# Patient Record
Sex: Female | Born: 1955 | Race: Black or African American | Hispanic: No | Marital: Single | Smoking: Former smoker
Health system: Southern US, Community
[De-identification: ages and names within clinical notes are randomized; demographics above are authoritative.]

## PROBLEM LIST (undated history)

## (undated) DIAGNOSIS — E7800 Pure hypercholesterolemia, unspecified: Secondary | ICD-10-CM

## (undated) DIAGNOSIS — J45909 Unspecified asthma, uncomplicated: Secondary | ICD-10-CM

## (undated) DIAGNOSIS — E559 Vitamin D deficiency, unspecified: Secondary | ICD-10-CM

## (undated) DIAGNOSIS — E0789 Other specified disorders of thyroid: Secondary | ICD-10-CM

## (undated) DIAGNOSIS — E119 Type 2 diabetes mellitus without complications: Secondary | ICD-10-CM

## (undated) DIAGNOSIS — M549 Dorsalgia, unspecified: Secondary | ICD-10-CM

## (undated) DIAGNOSIS — N189 Chronic kidney disease, unspecified: Secondary | ICD-10-CM

## (undated) DIAGNOSIS — J439 Emphysema, unspecified: Secondary | ICD-10-CM

## (undated) DIAGNOSIS — I10 Essential (primary) hypertension: Secondary | ICD-10-CM

## (undated) DIAGNOSIS — J449 Chronic obstructive pulmonary disease, unspecified: Secondary | ICD-10-CM

## (undated) DIAGNOSIS — E739 Lactose intolerance, unspecified: Secondary | ICD-10-CM

## (undated) DIAGNOSIS — R12 Heartburn: Secondary | ICD-10-CM

## (undated) DIAGNOSIS — T7840XA Allergy, unspecified, initial encounter: Secondary | ICD-10-CM

## (undated) DIAGNOSIS — K5900 Constipation, unspecified: Secondary | ICD-10-CM

## (undated) DIAGNOSIS — H409 Unspecified glaucoma: Secondary | ICD-10-CM

## (undated) DIAGNOSIS — G4730 Sleep apnea, unspecified: Secondary | ICD-10-CM

## (undated) DIAGNOSIS — M1990 Unspecified osteoarthritis, unspecified site: Secondary | ICD-10-CM

## (undated) DIAGNOSIS — H269 Unspecified cataract: Secondary | ICD-10-CM

## (undated) DIAGNOSIS — R0602 Shortness of breath: Secondary | ICD-10-CM

## (undated) HISTORY — DX: Chronic kidney disease, unspecified: N18.9

## (undated) HISTORY — DX: Lactose intolerance, unspecified: E73.9

## (undated) HISTORY — DX: Emphysema, unspecified: J43.9

## (undated) HISTORY — DX: Shortness of breath: R06.02

## (undated) HISTORY — DX: Vitamin D deficiency, unspecified: E55.9

## (undated) HISTORY — DX: Allergy, unspecified, initial encounter: T78.40XA

## (undated) HISTORY — DX: Other specified disorders of thyroid: E07.89

## (undated) HISTORY — PX: BREAST SURGERY: SHX581

## (undated) HISTORY — DX: Dorsalgia, unspecified: M54.9

## (undated) HISTORY — DX: Type 2 diabetes mellitus without complications: E11.9

## (undated) HISTORY — DX: Unspecified cataract: H26.9

## (undated) HISTORY — DX: Sleep apnea, unspecified: G47.30

## (undated) HISTORY — DX: Unspecified glaucoma: H40.9

## (undated) HISTORY — PX: COLONOSCOPY: SHX174

## (undated) HISTORY — DX: Constipation, unspecified: K59.00

## (undated) HISTORY — DX: Pure hypercholesterolemia, unspecified: E78.00

## (undated) HISTORY — DX: Unspecified osteoarthritis, unspecified site: M19.90

## (undated) HISTORY — DX: Heartburn: R12

---

## 1998-04-06 ENCOUNTER — Ambulatory Visit (HOSPITAL_COMMUNITY): Admission: RE | Admit: 1998-04-06 | Discharge: 1998-04-06 | Payer: Self-pay | Admitting: Family Medicine

## 1998-04-06 ENCOUNTER — Encounter: Payer: Self-pay | Admitting: Family Medicine

## 1998-06-14 ENCOUNTER — Emergency Department (HOSPITAL_COMMUNITY): Admission: EM | Admit: 1998-06-14 | Discharge: 1998-06-14 | Payer: Self-pay | Admitting: Emergency Medicine

## 1998-10-02 ENCOUNTER — Ambulatory Visit (HOSPITAL_COMMUNITY): Admission: RE | Admit: 1998-10-02 | Discharge: 1998-10-02 | Payer: Self-pay | Admitting: *Deleted

## 1998-10-02 ENCOUNTER — Encounter: Payer: Self-pay | Admitting: Family Medicine

## 1998-11-03 ENCOUNTER — Ambulatory Visit (HOSPITAL_COMMUNITY): Admission: RE | Admit: 1998-11-03 | Discharge: 1998-11-03 | Payer: Self-pay | Admitting: General Surgery

## 1999-05-12 ENCOUNTER — Ambulatory Visit (HOSPITAL_COMMUNITY): Admission: RE | Admit: 1999-05-12 | Discharge: 1999-05-12 | Payer: Self-pay | Admitting: *Deleted

## 1999-05-12 ENCOUNTER — Encounter: Payer: Self-pay | Admitting: Chiropractic Medicine

## 2000-03-07 ENCOUNTER — Ambulatory Visit (HOSPITAL_COMMUNITY): Admission: RE | Admit: 2000-03-07 | Discharge: 2000-03-07 | Payer: Self-pay | Admitting: Family Medicine

## 2000-03-07 ENCOUNTER — Encounter: Payer: Self-pay | Admitting: Family Medicine

## 2000-04-22 ENCOUNTER — Encounter: Admission: RE | Admit: 2000-04-22 | Discharge: 2000-05-20 | Payer: Self-pay | Admitting: Family Medicine

## 2000-06-30 ENCOUNTER — Encounter: Payer: Self-pay | Admitting: Family Medicine

## 2000-06-30 ENCOUNTER — Ambulatory Visit (HOSPITAL_COMMUNITY): Admission: RE | Admit: 2000-06-30 | Discharge: 2000-06-30 | Payer: Self-pay | Admitting: Family Medicine

## 2001-07-23 ENCOUNTER — Encounter: Admission: RE | Admit: 2001-07-23 | Discharge: 2001-07-23 | Payer: Self-pay | Admitting: General Practice

## 2001-07-23 ENCOUNTER — Encounter: Payer: Self-pay | Admitting: General Practice

## 2003-07-11 ENCOUNTER — Ambulatory Visit (HOSPITAL_COMMUNITY): Admission: RE | Admit: 2003-07-11 | Discharge: 2003-07-11 | Payer: Self-pay | Admitting: Family Medicine

## 2003-09-14 ENCOUNTER — Ambulatory Visit (HOSPITAL_COMMUNITY): Admission: RE | Admit: 2003-09-14 | Discharge: 2003-09-14 | Payer: Self-pay | Admitting: Orthopedic Surgery

## 2003-11-21 ENCOUNTER — Other Ambulatory Visit: Admission: RE | Admit: 2003-11-21 | Discharge: 2003-11-21 | Payer: Self-pay | Admitting: Family Medicine

## 2003-11-25 ENCOUNTER — Ambulatory Visit (HOSPITAL_COMMUNITY): Admission: RE | Admit: 2003-11-25 | Discharge: 2003-11-25 | Payer: Self-pay | Admitting: Family Medicine

## 2004-05-12 ENCOUNTER — Emergency Department (HOSPITAL_COMMUNITY): Admission: EM | Admit: 2004-05-12 | Discharge: 2004-05-12 | Payer: Self-pay | Admitting: Emergency Medicine

## 2004-08-17 ENCOUNTER — Ambulatory Visit: Payer: Self-pay | Admitting: Family Medicine

## 2004-09-13 ENCOUNTER — Encounter: Admission: RE | Admit: 2004-09-13 | Discharge: 2004-12-12 | Payer: Self-pay | Admitting: Orthopedic Surgery

## 2005-04-12 ENCOUNTER — Ambulatory Visit: Payer: Self-pay | Admitting: Family Medicine

## 2005-07-12 ENCOUNTER — Encounter (INDEPENDENT_AMBULATORY_CARE_PROVIDER_SITE_OTHER): Payer: Self-pay | Admitting: Family Medicine

## 2005-07-12 ENCOUNTER — Ambulatory Visit: Payer: Self-pay | Admitting: Family Medicine

## 2005-08-09 ENCOUNTER — Ambulatory Visit (HOSPITAL_COMMUNITY): Admission: RE | Admit: 2005-08-09 | Discharge: 2005-08-09 | Payer: Self-pay | Admitting: Family Medicine

## 2005-09-20 ENCOUNTER — Ambulatory Visit: Payer: Self-pay | Admitting: Nurse Practitioner

## 2005-10-09 ENCOUNTER — Ambulatory Visit: Payer: Self-pay | Admitting: Family Medicine

## 2005-11-27 ENCOUNTER — Ambulatory Visit: Payer: Self-pay | Admitting: Family Medicine

## 2005-12-24 ENCOUNTER — Ambulatory Visit: Payer: Self-pay | Admitting: Family Medicine

## 2006-04-22 ENCOUNTER — Ambulatory Visit: Payer: Self-pay | Admitting: Family Medicine

## 2006-04-23 ENCOUNTER — Ambulatory Visit: Payer: Self-pay | Admitting: *Deleted

## 2006-07-24 ENCOUNTER — Emergency Department (HOSPITAL_COMMUNITY): Admission: EM | Admit: 2006-07-24 | Discharge: 2006-07-24 | Payer: Self-pay | Admitting: Emergency Medicine

## 2006-08-01 ENCOUNTER — Emergency Department (HOSPITAL_COMMUNITY): Admission: EM | Admit: 2006-08-01 | Discharge: 2006-08-01 | Payer: Self-pay | Admitting: Family Medicine

## 2006-08-14 ENCOUNTER — Ambulatory Visit (HOSPITAL_COMMUNITY): Admission: RE | Admit: 2006-08-14 | Discharge: 2006-08-14 | Payer: Self-pay | Admitting: Family Medicine

## 2006-08-18 ENCOUNTER — Ambulatory Visit: Payer: Self-pay | Admitting: Family Medicine

## 2006-11-28 ENCOUNTER — Ambulatory Visit: Payer: Self-pay | Admitting: Family Medicine

## 2007-04-22 ENCOUNTER — Encounter (INDEPENDENT_AMBULATORY_CARE_PROVIDER_SITE_OTHER): Payer: Self-pay | Admitting: *Deleted

## 2007-06-11 ENCOUNTER — Ambulatory Visit: Payer: Self-pay | Admitting: Nurse Practitioner

## 2007-06-26 ENCOUNTER — Ambulatory Visit: Payer: Self-pay | Admitting: Family Medicine

## 2007-06-30 ENCOUNTER — Ambulatory Visit (HOSPITAL_COMMUNITY): Admission: RE | Admit: 2007-06-30 | Discharge: 2007-06-30 | Payer: Self-pay | Admitting: Family Medicine

## 2007-07-01 ENCOUNTER — Encounter (INDEPENDENT_AMBULATORY_CARE_PROVIDER_SITE_OTHER): Payer: Self-pay | Admitting: Family Medicine

## 2007-07-01 ENCOUNTER — Ambulatory Visit: Payer: Self-pay | Admitting: Internal Medicine

## 2007-07-01 LAB — CONVERTED CEMR LAB
ALT: 9 units/L (ref 0–35)
AST: 11 units/L (ref 0–37)
Alkaline Phosphatase: 85 units/L (ref 39–117)
Basophils Absolute: 0 10*3/uL (ref 0.0–0.1)
Basophils Relative: 1 % (ref 0–1)
Chloride: 107 meq/L (ref 96–112)
Hemoglobin: 16 g/dL — ABNORMAL HIGH (ref 12.0–15.0)
MCHC: 34.5 g/dL (ref 30.0–36.0)
Neutro Abs: 2.3 10*3/uL (ref 1.7–7.7)
Neutrophils Relative %: 46 % (ref 43–77)
Platelets: 291 10*3/uL (ref 150–400)
Potassium: 4.1 meq/L (ref 3.5–5.3)
RDW: 13.6 % (ref 11.5–15.5)

## 2007-07-06 ENCOUNTER — Ambulatory Visit (HOSPITAL_COMMUNITY): Admission: RE | Admit: 2007-07-06 | Discharge: 2007-07-06 | Payer: Self-pay | Admitting: Family Medicine

## 2007-08-21 ENCOUNTER — Ambulatory Visit (HOSPITAL_COMMUNITY): Admission: RE | Admit: 2007-08-21 | Discharge: 2007-08-21 | Payer: Self-pay | Admitting: Family Medicine

## 2008-04-20 ENCOUNTER — Encounter (INDEPENDENT_AMBULATORY_CARE_PROVIDER_SITE_OTHER): Payer: Self-pay | Admitting: Family Medicine

## 2008-04-20 ENCOUNTER — Ambulatory Visit: Payer: Self-pay | Admitting: Internal Medicine

## 2008-04-20 LAB — CONVERTED CEMR LAB
AST: 11 units/L (ref 0–37)
Albumin: 4.3 g/dL (ref 3.5–5.2)
Alkaline Phosphatase: 88 units/L (ref 39–117)
BUN: 18 mg/dL (ref 6–23)
Basophils Relative: 0 % (ref 0–1)
Eosinophils Absolute: 0.3 10*3/uL (ref 0.0–0.7)
MCHC: 32.6 g/dL (ref 30.0–36.0)
MCV: 92 fL (ref 78.0–100.0)
Monocytes Relative: 7 % (ref 3–12)
Neutrophils Relative %: 49 % (ref 43–77)
Platelets: 300 10*3/uL (ref 150–400)
Potassium: 4.7 meq/L (ref 3.5–5.3)
RBC: 5.24 M/uL — ABNORMAL HIGH (ref 3.87–5.11)

## 2008-06-07 ENCOUNTER — Ambulatory Visit (HOSPITAL_COMMUNITY): Admission: RE | Admit: 2008-06-07 | Discharge: 2008-06-07 | Payer: Self-pay | Admitting: Internal Medicine

## 2008-08-12 ENCOUNTER — Ambulatory Visit: Payer: Self-pay | Admitting: Family Medicine

## 2008-08-12 ENCOUNTER — Encounter (INDEPENDENT_AMBULATORY_CARE_PROVIDER_SITE_OTHER): Payer: Self-pay | Admitting: Family Medicine

## 2008-08-23 ENCOUNTER — Ambulatory Visit (HOSPITAL_COMMUNITY): Admission: RE | Admit: 2008-08-23 | Discharge: 2008-08-23 | Payer: Self-pay | Admitting: Internal Medicine

## 2008-09-27 ENCOUNTER — Ambulatory Visit: Payer: Self-pay | Admitting: Family Medicine

## 2008-10-28 ENCOUNTER — Ambulatory Visit: Payer: Self-pay | Admitting: Family Medicine

## 2008-12-21 ENCOUNTER — Ambulatory Visit: Payer: Self-pay | Admitting: Family Medicine

## 2008-12-21 ENCOUNTER — Ambulatory Visit: Payer: Self-pay | Admitting: *Deleted

## 2009-08-02 ENCOUNTER — Telehealth (INDEPENDENT_AMBULATORY_CARE_PROVIDER_SITE_OTHER): Payer: Self-pay | Admitting: *Deleted

## 2009-08-02 ENCOUNTER — Ambulatory Visit: Payer: Self-pay | Admitting: Internal Medicine

## 2009-08-24 ENCOUNTER — Ambulatory Visit (HOSPITAL_COMMUNITY): Admission: RE | Admit: 2009-08-24 | Discharge: 2009-08-24 | Payer: Self-pay | Admitting: Family Medicine

## 2009-08-29 ENCOUNTER — Ambulatory Visit: Payer: Self-pay | Admitting: Internal Medicine

## 2009-09-21 ENCOUNTER — Ambulatory Visit: Payer: Self-pay | Admitting: Family Medicine

## 2009-09-22 ENCOUNTER — Ambulatory Visit (HOSPITAL_COMMUNITY): Admission: RE | Admit: 2009-09-22 | Discharge: 2009-09-22 | Payer: Self-pay | Admitting: Family Medicine

## 2009-10-10 ENCOUNTER — Ambulatory Visit: Payer: Self-pay | Admitting: Internal Medicine

## 2010-01-24 ENCOUNTER — Ambulatory Visit: Payer: Self-pay | Admitting: Internal Medicine

## 2010-02-22 ENCOUNTER — Ambulatory Visit: Payer: Self-pay | Admitting: Family Medicine

## 2010-06-15 ENCOUNTER — Emergency Department (HOSPITAL_COMMUNITY)
Admission: EM | Admit: 2010-06-15 | Discharge: 2010-06-15 | Payer: Self-pay | Source: Home / Self Care | Admitting: Emergency Medicine

## 2010-08-26 ENCOUNTER — Encounter: Payer: Self-pay | Admitting: Family Medicine

## 2010-08-28 ENCOUNTER — Encounter (INDEPENDENT_AMBULATORY_CARE_PROVIDER_SITE_OTHER): Payer: Self-pay | Admitting: Family Medicine

## 2010-08-28 LAB — CONVERTED CEMR LAB
CO2: 24 meq/L (ref 19–32)
Calcium: 9.5 mg/dL (ref 8.4–10.5)
Chloride: 106 meq/L (ref 96–112)
Creatinine, Ser: 0.77 mg/dL (ref 0.40–1.20)
Eosinophils Relative: 4 % (ref 0–5)
Glucose, Bld: 96 mg/dL (ref 70–99)
HCT: 44.6 % (ref 36.0–46.0)
HCV Ab: NEGATIVE
Hemoglobin: 15.4 g/dL — ABNORMAL HIGH (ref 12.0–15.0)
Lymphocytes Relative: 35 % (ref 12–46)
Lymphs Abs: 2 10*3/uL (ref 0.7–4.0)
Monocytes Absolute: 0.4 10*3/uL (ref 0.1–1.0)
Monocytes Relative: 6 % (ref 3–12)
Neutro Abs: 3.2 10*3/uL (ref 1.7–7.7)
RBC: 4.98 M/uL (ref 3.87–5.11)
RDW: 13.4 % (ref 11.5–15.5)
TSH: 1.144 microintl units/mL (ref 0.350–4.500)
Total Bilirubin: 0.4 mg/dL (ref 0.3–1.2)
WBC: 5.7 10*3/uL (ref 4.0–10.5)

## 2010-10-10 ENCOUNTER — Other Ambulatory Visit (HOSPITAL_COMMUNITY): Payer: Self-pay | Admitting: Family Medicine

## 2010-10-10 DIAGNOSIS — Z1231 Encounter for screening mammogram for malignant neoplasm of breast: Secondary | ICD-10-CM

## 2010-10-12 ENCOUNTER — Ambulatory Visit (HOSPITAL_COMMUNITY)
Admission: RE | Admit: 2010-10-12 | Discharge: 2010-10-12 | Disposition: A | Discharge status: Home / Self Care | Payer: Self-pay | Source: Ambulatory Visit | Attending: Family Medicine | Admitting: Family Medicine

## 2010-10-12 DIAGNOSIS — Z1231 Encounter for screening mammogram for malignant neoplasm of breast: Secondary | ICD-10-CM | POA: Insufficient documentation

## 2011-03-26 ENCOUNTER — Other Ambulatory Visit: Payer: Self-pay | Admitting: Family Medicine

## 2011-10-23 ENCOUNTER — Other Ambulatory Visit (HOSPITAL_COMMUNITY): Payer: Self-pay | Admitting: Family Medicine

## 2011-10-23 DIAGNOSIS — Z1231 Encounter for screening mammogram for malignant neoplasm of breast: Secondary | ICD-10-CM

## 2014-03-09 ENCOUNTER — Other Ambulatory Visit: Payer: Self-pay | Admitting: Nurse Practitioner

## 2014-03-09 ENCOUNTER — Other Ambulatory Visit (HOSPITAL_COMMUNITY): Payer: Self-pay | Admitting: Nurse Practitioner

## 2014-03-09 DIAGNOSIS — Z1231 Encounter for screening mammogram for malignant neoplasm of breast: Secondary | ICD-10-CM

## 2014-03-18 ENCOUNTER — Ambulatory Visit (HOSPITAL_COMMUNITY)
Admission: RE | Admit: 2014-03-18 | Discharge: 2014-03-18 | Disposition: A | Discharge status: Home / Self Care | Payer: No Typology Code available for payment source | Source: Ambulatory Visit | Attending: Nurse Practitioner | Admitting: Nurse Practitioner

## 2014-03-18 DIAGNOSIS — Z1231 Encounter for screening mammogram for malignant neoplasm of breast: Secondary | ICD-10-CM | POA: Insufficient documentation

## 2014-04-19 ENCOUNTER — Other Ambulatory Visit: Payer: Self-pay | Admitting: Nurse Practitioner

## 2014-04-19 DIAGNOSIS — Z1231 Encounter for screening mammogram for malignant neoplasm of breast: Secondary | ICD-10-CM

## 2014-05-05 ENCOUNTER — Other Ambulatory Visit: Payer: Self-pay | Admitting: Primary Care

## 2014-05-05 ENCOUNTER — Ambulatory Visit
Admission: RE | Admit: 2014-05-05 | Discharge: 2014-05-05 | Disposition: A | Discharge status: Home / Self Care | Payer: No Typology Code available for payment source | Source: Ambulatory Visit | Attending: Primary Care | Admitting: Primary Care

## 2014-05-05 DIAGNOSIS — R062 Wheezing: Secondary | ICD-10-CM

## 2014-05-05 DIAGNOSIS — R05 Cough: Secondary | ICD-10-CM

## 2014-05-05 DIAGNOSIS — R059 Cough, unspecified: Secondary | ICD-10-CM

## 2015-08-16 ENCOUNTER — Other Ambulatory Visit: Payer: Self-pay | Admitting: Primary Care

## 2015-08-16 DIAGNOSIS — Z1231 Encounter for screening mammogram for malignant neoplasm of breast: Secondary | ICD-10-CM

## 2015-09-05 ENCOUNTER — Ambulatory Visit
Admission: RE | Admit: 2015-09-05 | Discharge: 2015-09-05 | Disposition: A | Discharge status: Home / Self Care | Payer: No Typology Code available for payment source | Source: Ambulatory Visit | Attending: Primary Care | Admitting: Primary Care

## 2015-09-05 DIAGNOSIS — Z1231 Encounter for screening mammogram for malignant neoplasm of breast: Secondary | ICD-10-CM

## 2018-05-08 ENCOUNTER — Observation Stay (HOSPITAL_COMMUNITY)
Admission: EM | Admit: 2018-05-08 | Discharge: 2018-05-10 | Disposition: A | Discharge status: Home / Self Care | Payer: Self-pay | Attending: Internal Medicine | Admitting: Internal Medicine

## 2018-05-08 ENCOUNTER — Emergency Department (HOSPITAL_COMMUNITY): Payer: Self-pay

## 2018-05-08 ENCOUNTER — Encounter (HOSPITAL_COMMUNITY): Payer: Self-pay | Admitting: Emergency Medicine

## 2018-05-08 DIAGNOSIS — J441 Chronic obstructive pulmonary disease with (acute) exacerbation: Secondary | ICD-10-CM

## 2018-05-08 DIAGNOSIS — E119 Type 2 diabetes mellitus without complications: Secondary | ICD-10-CM

## 2018-05-08 DIAGNOSIS — J069 Acute upper respiratory infection, unspecified: Principal | ICD-10-CM | POA: Insufficient documentation

## 2018-05-08 DIAGNOSIS — Z888 Allergy status to other drugs, medicaments and biological substances status: Secondary | ICD-10-CM | POA: Insufficient documentation

## 2018-05-08 DIAGNOSIS — Z6841 Body Mass Index (BMI) 40.0 and over, adult: Secondary | ICD-10-CM | POA: Insufficient documentation

## 2018-05-08 DIAGNOSIS — I10 Essential (primary) hypertension: Secondary | ICD-10-CM

## 2018-05-08 DIAGNOSIS — Z720 Tobacco use: Secondary | ICD-10-CM

## 2018-05-08 DIAGNOSIS — Z79899 Other long term (current) drug therapy: Secondary | ICD-10-CM | POA: Insufficient documentation

## 2018-05-08 DIAGNOSIS — R0602 Shortness of breath: Secondary | ICD-10-CM

## 2018-05-08 DIAGNOSIS — J9601 Acute respiratory failure with hypoxia: Secondary | ICD-10-CM

## 2018-05-08 DIAGNOSIS — J9621 Acute and chronic respiratory failure with hypoxia: Secondary | ICD-10-CM

## 2018-05-08 DIAGNOSIS — F17210 Nicotine dependence, cigarettes, uncomplicated: Secondary | ICD-10-CM | POA: Insufficient documentation

## 2018-05-08 DIAGNOSIS — E669 Obesity, unspecified: Secondary | ICD-10-CM | POA: Insufficient documentation

## 2018-05-08 DIAGNOSIS — Z23 Encounter for immunization: Secondary | ICD-10-CM | POA: Insufficient documentation

## 2018-05-08 HISTORY — DX: Chronic obstructive pulmonary disease, unspecified: J44.9

## 2018-05-08 HISTORY — DX: Type 2 diabetes mellitus without complications: E11.9

## 2018-05-08 HISTORY — DX: Unspecified asthma, uncomplicated: J45.909

## 2018-05-08 HISTORY — DX: Essential (primary) hypertension: I10

## 2018-05-08 LAB — CBC
HCT: 50.2 % — ABNORMAL HIGH (ref 36.0–46.0)
Hemoglobin: 16.5 g/dL — ABNORMAL HIGH (ref 12.0–15.0)
MCH: 31.7 pg (ref 26.0–34.0)
MCHC: 32.9 g/dL (ref 30.0–36.0)
MCV: 96.4 fL (ref 78.0–100.0)
Platelets: 214 10*3/uL (ref 150–400)
RBC: 5.21 MIL/uL — ABNORMAL HIGH (ref 3.87–5.11)
RDW: 13.1 % (ref 11.5–15.5)
WBC: 5.9 10*3/uL (ref 4.0–10.5)

## 2018-05-08 LAB — BASIC METABOLIC PANEL
Anion gap: 7 (ref 5–15)
BUN: 13 mg/dL (ref 8–23)
CO2: 31 mmol/L (ref 22–32)
Calcium: 8.9 mg/dL (ref 8.9–10.3)
Chloride: 103 mmol/L (ref 98–111)
Creatinine, Ser: 0.71 mg/dL (ref 0.44–1.00)
GFR calc Af Amer: >60 mL/min (ref >60)
GFR calc non Af Amer: >60 mL/min (ref >60)
Glucose, Bld: 97 mg/dL (ref 70–99)
Potassium: 3.7 mmol/L (ref 3.5–5.1)
Sodium: 141 mmol/L (ref 135–145)

## 2018-05-08 LAB — I-STAT TROPONIN, ED: Troponin i, poc: 0 ng/mL (ref 0.00–0.08)

## 2018-05-08 LAB — BRAIN NATRIURETIC PEPTIDE: B Natriuretic Peptide: 55.5 pg/mL (ref 0.0–100.0)

## 2018-05-08 MED ORDER — BENZONATATE 100 MG PO CAPS
100.0000 mg | ORAL_CAPSULE | Freq: Once | ORAL | Status: AC
  Administered 2018-05-08: 100 mg via ORAL
  Filled 2018-05-08: qty 1

## 2018-05-08 MED ORDER — NICOTINE 21 MG/24HR TD PT24
21.0000 mg | MEDICATED_PATCH | Freq: Every day | TRANSDERMAL | Status: DC
  Administered 2018-05-09 – 2018-05-10 (×2): 21 mg via TRANSDERMAL
  Filled 2018-05-08 (×2): qty 1

## 2018-05-08 MED ORDER — HYDROCHLOROTHIAZIDE 25 MG PO TABS
25.0000 mg | ORAL_TABLET | Freq: Every day | ORAL | Status: DC
  Administered 2018-05-09 – 2018-05-10 (×2): 25 mg via ORAL
  Filled 2018-05-08 (×2): qty 1

## 2018-05-08 MED ORDER — SODIUM CHLORIDE 0.9 % IV SOLN
1.0000 g | Freq: Once | INTRAVENOUS | Status: AC
  Administered 2018-05-08: 1 g via INTRAVENOUS
  Filled 2018-05-08: qty 10

## 2018-05-08 MED ORDER — IPRATROPIUM-ALBUTEROL 0.5-2.5 (3) MG/3ML IN SOLN
3.0000 mL | RESPIRATORY_TRACT | Status: AC
  Administered 2018-05-08 (×3): 3 mL via RESPIRATORY_TRACT
  Filled 2018-05-08: qty 6
  Filled 2018-05-08: qty 3

## 2018-05-08 MED ORDER — METHYLPREDNISOLONE SODIUM SUCC 125 MG IJ SOLR
60.0000 mg | Freq: Two times a day (BID) | INTRAMUSCULAR | Status: DC
  Administered 2018-05-09: 60 mg via INTRAVENOUS
  Filled 2018-05-08: qty 2

## 2018-05-08 MED ORDER — ALBUTEROL SULFATE (2.5 MG/3ML) 0.083% IN NEBU: 2.5000 mg | INHALATION_SOLUTION | RESPIRATORY_TRACT | Status: DC | PRN

## 2018-05-08 MED ORDER — SODIUM CHLORIDE 0.9 % IV SOLN
1.0000 g | INTRAVENOUS | Status: DC
  Administered 2018-05-09: 1 g via INTRAVENOUS
  Filled 2018-05-08 (×2): qty 10

## 2018-05-08 MED ORDER — HYDROCOD POLST-CPM POLST ER 10-8 MG/5ML PO SUER: 5.0000 mL | Freq: Two times a day (BID) | ORAL | Status: DC | PRN

## 2018-05-08 MED ORDER — PREDNISONE 20 MG PO TABS
60.0000 mg | ORAL_TABLET | Freq: Once | ORAL | Status: AC
  Administered 2018-05-08: 60 mg via ORAL
  Filled 2018-05-08: qty 3

## 2018-05-08 MED ORDER — ENOXAPARIN SODIUM 40 MG/0.4ML ~~LOC~~ SOLN
40.0000 mg | Freq: Every day | SUBCUTANEOUS | Status: DC
  Administered 2018-05-09 (×2): 40 mg via SUBCUTANEOUS
  Filled 2018-05-08 (×2): qty 0.4

## 2018-05-08 MED ORDER — IPRATROPIUM-ALBUTEROL 0.5-2.5 (3) MG/3ML IN SOLN
3.0000 mL | RESPIRATORY_TRACT | Status: DC
  Administered 2018-05-09 – 2018-05-10 (×9): 3 mL via RESPIRATORY_TRACT
  Filled 2018-05-08 (×10): qty 3

## 2018-05-08 MED ORDER — SODIUM CHLORIDE 0.9 % IV SOLN
500.0000 mg | INTRAVENOUS | Status: DC
  Administered 2018-05-09: 500 mg via INTRAVENOUS
  Filled 2018-05-08: qty 500

## 2018-05-08 MED ORDER — SODIUM CHLORIDE 0.9 % IV SOLN
500.0000 mg | Freq: Once | INTRAVENOUS | Status: AC
  Administered 2018-05-08: 500 mg via INTRAVENOUS
  Filled 2018-05-08: qty 500

## 2018-05-08 NOTE — ED Notes (Signed)
Pt given turkey sandwich and water

## 2018-05-08 NOTE — ED Provider Notes (Signed)
[place] EMERGENCY DEPARTMENT Provider Note   CSN: [id number] Arrival date & time: 05/08/18  1652     History   Chief Complaint Chief Complaint  Patient presents with  . Shortness of Breath    HPI Leah Lawson is a 62 y.o. female.  62 yo F with a chief complaint of cough and congestion.  This been going on for the past couple weeks.  She felt that her shortness of breath is gotten worse today and so came to the emergency department.  She was actually seen at urgent care and was noted to be hypoxic and then was sent over here.  She currently feels much better, she had coughed up a large amount of sputum.  She is a current smoker but is planning to quit.  She denies fevers or chills.  Denies ear or facial pain.  Has a mild sore throat.  She is been trying over-the-counter medicines without much relief.  She was unsure of sick contacts but mentions there were a few people at work that had been sick.  The history is provided by the patient and a relative.  Shortness of Breath  This is a new problem. The average episode lasts 2 weeks. The problem occurs continuously.The current episode started more than 1 week ago. The problem has been gradually worsening. Associated symptoms include cough. Pertinent negatives include no fever, no headaches, no rhinorrhea, no wheezing, no chest pain and no vomiting. She has tried nothing for the symptoms. The treatment provided no relief. She has had no prior hospitalizations. She has had no prior ED visits. She has had no prior ICU admissions.    Past Medical History:  Diagnosis Date  . Asthma   . COPD (chronic obstructive pulmonary disease) ([place])   . Diabetes mellitus without complication ([place])    "pre-diabetic"  . Hypertension     There are no active problems to display for this patient.   Past Surgical History:  Procedure Laterality Date  . BREAST SURGERY       OB History   None      Home Medications    Prior  to Admission medications   Medication Sig Start Date End Date Taking? Authorizing Provider  albuterol (PROVENTIL HFA;VENTOLIN HFA) 108 (90 Base) MCG/ACT inhaler Inhale 1 puff into the lungs every 6 (six) hours as needed for wheezing or shortness of breath.   Yes [provider]  hydrochlorothiazide (HYDRODIURIL) 25 MG tablet Take 25 mg by mouth daily. 04/08/18  Yes [provider]  Phenyleph-Doxylamine-DM-APAP (ALKA-SELTZER PLS NIGHT CLD/FLU PO) Take 2 tablets by mouth as needed (cold syptoms).   Yes [provider]    Family History History reviewed. No pertinent family history.  Social History Social History   Tobacco Use  . Smoking status: Current Every Day Smoker    Packs/day: 0.50  . Smokeless tobacco: Never Used  Substance Use Topics  . Alcohol use: Not Currently  . Drug use: Never     Allergies   Mucinex [guaifenesin er] and Singulair [montelukast sodium]   Review of Systems Review of Systems  Constitutional: Negative for chills and fever.  HENT: Positive for congestion. Negative for rhinorrhea.   Eyes: Negative for redness and visual disturbance.  Respiratory: Positive for cough and shortness of breath. Negative for wheezing.   Cardiovascular: Negative for chest pain and palpitations.  Gastrointestinal: Negative for nausea and vomiting.  Genitourinary: Negative for dysuria and urgency.  Musculoskeletal: Negative for arthralgias and myalgias.  Skin: Negative for pallor and wound.  Neurological: Negative for dizziness and headaches.     Physical Exam Updated Vital Signs BP (!) 154/80   Pulse 63   Temp 98.8 F (37.1 C) (Oral)   Resp 17   SpO2 97%   Physical Exam  Constitutional: She is oriented to person, place, and time. She appears well-developed and well-nourished. No distress.  HENT:  Head: Normocephalic and atraumatic.  Swollen turbinates, posterior nasal drip, no noted sinus ttp, tm normal bilaterally.    Eyes: Pupils are  equal, round, and reactive to light. EOM are normal.  Neck: Normal range of motion. Neck supple.  Cardiovascular: Normal rate and regular rhythm. Exam reveals no gallop and no friction rub.  No murmur heard. Pulmonary/Chest: Effort normal. She has wheezes ( Diffuse with prolonged expiratory effort). She has no rales.  Abdominal: Soft. She exhibits no distension. There is no tenderness.  Musculoskeletal: She exhibits no edema or tenderness.  Neurological: She is alert and oriented to person, place, and time.  Skin: Skin is warm and dry. She is not diaphoretic.  Psychiatric: She has a normal mood and affect. Her behavior is normal.  Nursing note and vitals reviewed.    ED Treatments / Results  Labs (all labs ordered are listed, but only abnormal results are displayed) Labs Reviewed  CBC - Abnormal; Notable for the following components:      Result Value   RBC 5.21 (*)    Hemoglobin 16.5 (*)    HCT 50.2 (*)    All other components within normal limits  CULTURE, BLOOD (ROUTINE X 2)  CULTURE, BLOOD (ROUTINE X 2)  BASIC METABOLIC PANEL  BRAIN NATRIURETIC PEPTIDE  I-STAT TROPONIN, ED    EKG EKG Interpretation  Date/Time:  Friday May 08 2018 17:04:47 EDT Ventricular Rate:  72 PR Interval:  132 QRS Duration: 76 QT Interval:  388 QTC Calculation: 424 R Axis:   50 Text Interpretation:  Sinus rhythm with marked sinus arrhythmia Low voltage QRS Cannot rule out Anterior infarct , age undetermined Abnormal ECG No significant change since last tracing Confirmed by Deno Etienne [phone]) on 05/08/2018 6:57:30 PM   Radiology Dg Chest 2 View  Result Date: 05/08/2018 CLINICAL DATA:  Cough and shortness of breath EXAM: CHEST - 2 VIEW COMPARISON:  05/05/2014 FINDINGS: Hyperinflation. Diffuse increased interstitial opacity. No pleural effusion. No focal infiltrate. Normal heart size. No pneumothorax. IMPRESSION: Diffusely increased interstitial opacity, suspect for diffuse interstitial  inflammatory process. There is no focal airspace disease. Electronically Signed   By: Donavan Foil M.D.   On: 05/08/2018 18:28    Procedures Procedures (including critical care time)  Medications Ordered in ED Medications  cefTRIAXone (ROCEPHIN) 1 g in sodium chloride 0.9 % 100 mL IVPB (has no administration in time range)  azithromycin (ZITHROMAX) 500 mg in sodium chloride 0.9 % 250 mL IVPB (has no administration in time range)  ipratropium-albuterol (DUONEB) 0.5-2.5 (3) MG/3ML nebulizer solution 3 mL (3 mLs Nebulization Given 05/08/18 1946)  predniSONE (DELTASONE) tablet 60 mg (60 mg Oral Given 05/08/18 1938)  benzonatate (TESSALON) capsule 100 mg (100 mg Oral Given 05/08/18 1938)     Initial Impression / Assessment and Plan / ED Course  I have reviewed the triage vital signs and the nursing notes.  Pertinent labs & imaging results that were available during my care of the patient were reviewed by me and considered in my medical decision making (see chart for details).     62 yo F  with a chief complaint of cough and shortness of breath.  She had a documented O2 sat of 82% on arrival here.  This improved significantly when she coughed up some sputum.  No hemoptysis, chest x-ray without focal pneumonia.  Patient is wheezing on my exam we will give steroids and duo nebs back-to-back and reassess.  Patient with improved aeration and subjective improvement of ability to breathe however the patient is hypoxic on repeat examination.  O2 saturation with a good waveform into the mid 80s at rest.  Placed on oxygen with improvement.  Will start on community-acquired pneumonia antibiotics.  Will discuss with hospitalist for admission.  CRITICAL CARE Performed by: Cecilio Asper   Total critical care time: 35 minutes  Critical care time was exclusive of separately billable procedures and treating other patients.  Critical care was necessary to treat or prevent imminent or life-threatening  deterioration.  Critical care was time spent personally by me on the following activities: development of treatment plan with patient and/or surrogate as well as nursing, discussions with consultants, evaluation of patient's response to treatment, examination of patient, obtaining history from patient or surrogate, ordering and performing treatments and interventions, ordering and review of laboratory studies, ordering and review of radiographic studies, pulse oximetry and re-evaluation of patient's condition.  The patients results and plan were reviewed and discussed.   Any x-rays performed were independently reviewed by myself.   Differential diagnosis were considered with the presenting HPI.  Medications  cefTRIAXone (ROCEPHIN) 1 g in sodium chloride 0.9 % 100 mL IVPB (has no administration in time range)  azithromycin (ZITHROMAX) 500 mg in sodium chloride 0.9 % 250 mL IVPB (has no administration in time range)  ipratropium-albuterol (DUONEB) 0.5-2.5 (3) MG/3ML nebulizer solution 3 mL (3 mLs Nebulization Given 05/08/18 1946)  predniSONE (DELTASONE) tablet 60 mg (60 mg Oral Given 05/08/18 1938)  benzonatate (TESSALON) capsule 100 mg (100 mg Oral Given 05/08/18 1938)    Vitals:   05/08/18 1703 05/08/18 1901 05/08/18 2000  BP: (!) 180/98 (!) 139/91 (!) 154/80  Pulse: 78 64 63  Resp: 20 14 17   Temp: 98.8 F (37.1 C)    TempSrc: Oral    SpO2: (!) 82% 96% 97%    Final diagnoses:  Acute respiratory failure with hypoxia (HCC)    Admission/ observation were discussed with the admitting physician, patient and/or family and they are comfortable with the plan.   Final Clinical Impressions(s) / ED Diagnoses   Final diagnoses:  Acute respiratory failure with hypoxia [facility])    ED Discharge Orders    None       Deno Etienne, DO 05/08/18 2343

## 2018-05-08 NOTE — ED Triage Notes (Signed)
Pt presents to ED for assessment of SOB after being seen at [facility].  Adventitious lung sounds noted, pt 82% on arrival on RA, then patient cleared her lungs by coughing and SpO2 up to 91%.  Pt placed on 2L [place] for comfort.  Denies CP, hx of pack a day smoker, COPD.  Denies CHF

## 2018-05-08 NOTE — ED Notes (Signed)
ED Provider at bedside. 

## 2018-05-08 NOTE — ED Notes (Signed)
Pt currently on 3L [place]

## 2018-05-08 NOTE — H&P (Signed)
History and Physical    Leah Lawson GYI:[id number] DOB: 01-Feb-1956 DOA: 05/08/2018  Referring MD/NP/PA:   PCP: Medicine, [facility]   Patient coming from:  The patient is coming from home.  At baseline, pt is independent for most of ADL.   Chief Complaint: Cough, shortness of breath  HPI: Leah Lawson is a 62 y.o. female with medical history significant of tobacco abuse, hypertension, diet-controlled diabetes, COPD, asthma, who presents with cough, shortness breath.  Patient states that she has been having shortness of breath and cough for more than 2 weeks, which has been progressively getting worse.  She coughs up yellow-colored sputum.  Denies fever, chills, chest pain.  She can speak in full sentence.  No nausea, vomiting, diarrhea, abdominal pain, symptoms of UTI or unilateral weakness.  ED Course: pt was found to have WBC 5.9, negative troponin, BNP 55, electrolytes renal function okay, no tachycardia, oxygen saturation 82% on room air.  Chest x-ray diffusely increased interstitial opacity.  Patient is placed on MedSurg bed for observation.  Review of Systems:    General: no fevers, chills, no body weight gain, has fatigue HEENT: no blurry vision, hearing changes or sore throat Respiratory: has dyspnea, coughing, wheezing CV: no chest pain, no palpitations GI: no nausea, vomiting, abdominal pain, diarrhea, constipation GU: no dysuria, burning on urination, increased urinary frequency, hematuria  Ext: no leg edema Neuro: no unilateral weakness, numbness, or tingling, no vision change or hearing loss Skin: no rash, no skin tear. MSK: No muscle spasm, no deformity, no limitation of range of movement in spin Heme: No easy bruising.  Travel history: No recent long distant travel.  Allergy:  Allergies  Allergen Reactions  . Mucinex [Guaifenesin Er] Diarrhea  . Singulair [Montelukast Sodium] Other (See Comments)    headaches    Past Medical History:    Diagnosis Date  . Asthma   . COPD (chronic obstructive pulmonary disease) ([place])   . Diabetes mellitus without complication ([place])    "pre-diabetic"  . Hypertension     Past Surgical History:  Procedure Laterality Date  . BREAST SURGERY      Social History:  reports that she has been smoking. She has been smoking about 0.50 packs per day. She has never used smokeless tobacco. She reports that she drank alcohol. She reports that she does not use drugs.  Family History: History reviewed. No pertinent family history.   Prior to Admission medications   Medication Sig Start Date End Date Taking? Authorizing Provider  albuterol (PROVENTIL HFA;VENTOLIN HFA) 108 (90 Base) MCG/ACT inhaler Inhale 1 puff into the lungs every 6 (six) hours as needed for wheezing or shortness of breath.   Yes [provider]  hydrochlorothiazide (HYDRODIURIL) 25 MG tablet Take 25 mg by mouth daily. 04/08/18  Yes [provider]  Phenyleph-Doxylamine-DM-APAP (ALKA-SELTZER PLS NIGHT CLD/FLU PO) Take 2 tablets by mouth as needed (cold syptoms).   Yes [provider]    Physical Exam: Vitals:   05/08/18 2315 05/08/18 2345 05/09/18 0442 05/09/18 0632  BP:  132/71  137/80  Pulse: 78 66  (!) 56  Resp: 18 18  (!) 22  Temp:  98.5 F (36.9 C)  98.1 F (36.7 C)  TempSrc:  Oral  Oral  SpO2: 95% 94%  96%  Weight:   100.9 kg   Height:   5\' 1"  (1.549 m)    General: Not in acute distress HEENT:       Eyes: PERRL, EOMI, no scleral  icterus.       ENT: No discharge from the ears and nose, no pharynx injection, no tonsillar enlargement.        Neck: No JVD, no bruit, no mass felt. Heme: No neck lymph node enlargement. Cardiac: S1/S2, RRR, No murmurs, No gallops or rubs. Respiratory: Has wheezing bilaterally. GI: Soft, nondistended, nontender, no rebound pain, no organomegaly, BS present. GU: No hematuria Ext: No pitting leg edema bilaterally. 2+DP/PT pulse bilaterally. Musculoskeletal: No  joint deformities, No joint redness or warmth, no limitation of ROM in spin. Skin: No rashes.  Neuro: Alert, oriented X3, cranial nerves II-XII grossly intact, moves all extremities normally. Psych: Patient is not psychotic, no suicidal or hemocidal ideation.  Labs on Admission: I have personally reviewed following labs and imaging studies  CBC: Recent Labs  Lab 05/08/18 1800  WBC 5.9  HGB 16.5*  HCT 50.2*  MCV 96.4  PLT 130   Basic Metabolic Panel: Recent Labs  Lab 05/08/18 1800  NA 141  K 3.7  CL 103  CO2 31  GLUCOSE 97  BUN 13  CREATININE 0.71  CALCIUM 8.9   GFR: Estimated Creatinine Clearance: 78.4 mL/min (by C-G formula based on SCr of 0.71 mg/dL). Liver Function Tests: No results for input(s): AST, ALT, ALKPHOS, BILITOT, PROT, ALBUMIN in the last 168 hours. No results for input(s): LIPASE, AMYLASE in the last 168 hours. No results for input(s): AMMONIA in the last 168 hours. Coagulation Profile: No results for input(s): INR, PROTIME in the last 168 hours. Cardiac Enzymes: No results for input(s): CKTOTAL, CKMB, CKMBINDEX, TROPONINI in the last 168 hours. BNP (last 3 results) No results for input(s): PROBNP in the last 8760 hours. HbA1C: No results for input(s): HGBA1C in the last 72 hours. CBG: Recent Labs  Lab 05/09/18 0631  GLUCAP 165*   Lipid Profile: No results for input(s): CHOL, HDL, LDLCALC, TRIG, CHOLHDL, LDLDIRECT in the last 72 hours. Thyroid Function Tests: No results for input(s): TSH, T4TOTAL, FREET4, T3FREE, THYROIDAB in the last 72 hours. Anemia Panel: No results for input(s): VITAMINB12, FOLATE, FERRITIN, TIBC, IRON, RETICCTPCT in the last 72 hours. Urine analysis: No results found for: COLORURINE, APPEARANCEUR, LABSPEC, PHURINE, GLUCOSEU, HGBUR, BILIRUBINUR, KETONESUR, PROTEINUR, UROBILINOGEN, NITRITE, LEUKOCYTESUR Sepsis Labs: @LABRCNTIP (procalcitonin:4,lacticidven:4) )No results found for this or any previous visit (from the past  240 hour(s)).   Radiological Exams on Admission: Dg Chest 2 View  Result Date: 05/08/2018 CLINICAL DATA:  Cough and shortness of breath EXAM: CHEST - 2 VIEW COMPARISON:  05/05/2014 FINDINGS: Hyperinflation. Diffuse increased interstitial opacity. No pleural effusion. No focal infiltrate. Normal heart size. No pneumothorax. IMPRESSION: Diffusely increased interstitial opacity, suspect for diffuse interstitial inflammatory process. There is no focal airspace disease. Electronically Signed   By: Donavan Foil M.D.   On: 05/08/2018 18:28     EKG: Independently reviewed.  Sinus rhythm, QTC 424, low voltage, nonspecific T wave change.  Assessment/Plan Principal Problem:   COPD exacerbation (HCC) Active Problems:   Hypertension   Tobacco abuse   Acute on chronic respiratory failure with hypoxia (HCC)  Acute on chronic respiratory failure with hypoxia due to COPD exacerbation [facility]): Patient has productive cough, shortness of breath, wheezing on auscultation, consistent with COPD exacerbation.  Chest x-ray showed diffuse interstitial opacity, cannot completely rule out pneumonia.  -will place on med-surg bdd for obs -Nebulizers: scheduled Duoneb and prn albuterol -Solu-Medrol 60 mg IV bid -Azithromycin and Rocephin -Mucinex for cough  -Incentive spirometry -Urine S. pneumococcal antigen -Follow up blood culture x2,  sputum culture, respiratory virus panel -Nasal cannula oxygen as needed to maintain O2 saturation 92% or greater  HTN:  -Continue home medications: HCTZ -IV hydralazine prn  Tobacco abuse: -Did counseling about importance of quitting smoking -Nicotine patch       DVT ppx: SQ Lovenox Code Status: Full code Family Communication: Yes, patient's son and daughter at bed side Disposition Plan:  Anticipate discharge back to previous home environment Consults called: none Admission status: med / tele   Date of Service 05/09/2018    Ivor Costa [facility] Pager  [phone]  If 7PM-7AM, please contact night-coverage [email or web address] Password [email or web address] 05/09/2018, 7:08 AM

## 2018-05-09 ENCOUNTER — Other Ambulatory Visit: Payer: Self-pay

## 2018-05-09 LAB — RESPIRATORY PANEL BY PCR
ADENOVIRUS-RVPPCR: NOT DETECTED
Bordetella pertussis: NOT DETECTED
CORONAVIRUS 229E-RVPPCR: NOT DETECTED
CORONAVIRUS NL63-RVPPCR: NOT DETECTED
CORONAVIRUS OC43-RVPPCR: NOT DETECTED
Chlamydophila pneumoniae: NOT DETECTED
Coronavirus HKU1: NOT DETECTED
Influenza A: NOT DETECTED
Influenza B: NOT DETECTED
MYCOPLASMA PNEUMONIAE-RVPPCR: NOT DETECTED
Metapneumovirus: NOT DETECTED
PARAINFLUENZA VIRUS 1-RVPPCR: NOT DETECTED
PARAINFLUENZA VIRUS 2-RVPPCR: NOT DETECTED
Parainfluenza Virus 3: NOT DETECTED
Parainfluenza Virus 4: NOT DETECTED
Respiratory Syncytial Virus: NOT DETECTED
Rhinovirus / Enterovirus: DETECTED — AB

## 2018-05-09 LAB — STREP PNEUMONIAE URINARY ANTIGEN: STREP PNEUMO URINARY ANTIGEN: NEGATIVE

## 2018-05-09 LAB — GLUCOSE, CAPILLARY: GLUCOSE-CAPILLARY: 165 mg/dL — AB (ref 70–99)

## 2018-05-09 LAB — EXPECTORATED SPUTUM ASSESSMENT W REFEX TO RESP CULTURE

## 2018-05-09 LAB — INFLUENZA PANEL BY PCR (TYPE A & B)
Influenza A By PCR: NEGATIVE
Influenza B By PCR: NEGATIVE

## 2018-05-09 LAB — EXPECTORATED SPUTUM ASSESSMENT W GRAM STAIN, RFLX TO RESP C

## 2018-05-09 MED ORDER — METHYLPREDNISOLONE SODIUM SUCC 40 MG IJ SOLR: 40.0000 mg | Freq: Every day | INTRAMUSCULAR | Status: DC

## 2018-05-09 MED ORDER — SODIUM CHLORIDE 0.9 % IV SOLN: INTRAVENOUS | Status: DC

## 2018-05-09 MED ORDER — SODIUM CHLORIDE 0.9 % IV SOLN
INTRAVENOUS | Status: DC
  Administered 2018-05-09: 03:00:00 via INTRAVENOUS

## 2018-05-09 MED ORDER — PNEUMOCOCCAL VAC POLYVALENT 25 MCG/0.5ML IJ INJ
0.5000 mL | INJECTION | INTRAMUSCULAR | Status: AC
  Administered 2018-05-10: 0.5 mL via INTRAMUSCULAR
  Filled 2018-05-09: qty 0.5

## 2018-05-09 MED ORDER — ZOLPIDEM TARTRATE 5 MG PO TABS: 5.0000 mg | ORAL_TABLET | Freq: Every evening | ORAL | Status: DC | PRN

## 2018-05-09 MED ORDER — HYDRALAZINE HCL 20 MG/ML IJ SOLN: 5.0000 mg | INTRAMUSCULAR | Status: DC | PRN

## 2018-05-09 MED ORDER — DOXYCYCLINE HYCLATE 100 MG PO TABS
100.0000 mg | ORAL_TABLET | Freq: Two times a day (BID) | ORAL | Status: DC
  Administered 2018-05-09 – 2018-05-10 (×2): 100 mg via ORAL
  Filled 2018-05-09 (×2): qty 1

## 2018-05-09 MED ORDER — ONDANSETRON HCL 4 MG/2ML IJ SOLN: 4.0000 mg | Freq: Three times a day (TID) | INTRAMUSCULAR | Status: DC | PRN

## 2018-05-09 MED ORDER — ACETAMINOPHEN 325 MG PO TABS
650.0000 mg | ORAL_TABLET | Freq: Four times a day (QID) | ORAL | Status: DC | PRN
  Administered 2018-05-10: 650 mg via ORAL
  Filled 2018-05-09 (×2): qty 2

## 2018-05-09 MED ORDER — INFLUENZA VAC SPLIT QUAD 0.5 ML IM SUSY
0.5000 mL | PREFILLED_SYRINGE | INTRAMUSCULAR | Status: AC
  Administered 2018-05-10: 0.5 mL via INTRAMUSCULAR
  Filled 2018-05-09: qty 0.5

## 2018-05-09 NOTE — Progress Notes (Signed)
@IPLOG @        PROGRESS NOTE                                                                                                                                                                                                             Patient Demographics:    Leah Lawson, is a 62 y.o. female, DOB - 1956-06-20, ZJQ:[id number]  Admit date - 05/08/2018   Admitting Physician Ivor Costa, MD  Outpatient Primary MD for the patient is [facility]  LOS - 0  Chief Complaint  Patient presents with  . Shortness of Breath       Brief Narrative  Leah Lawson is a 62 y.o. female with medical history significant of tobacco abuse, hypertension, diet-controlled diabetes, COPD, asthma, who presents with cough, shortness breath.   Subjective:    [employer] today has, No headache, No chest pain, No abdominal pain - No Nausea, No new weakness tingling or numbness, +ve productive Cough -  Improved SOB.    Assessment  & Plan :     1.  Rhinovirus URI mild hypoxia - requiring some oxygen, stop antibiotics and switch to simply oral doxycycline as she might have mild superimposed bacterial bronchitis, producing copious amounts of productive phlegm, encouraged to sit up, flutter valve added, will try to titrate off oxygen today.  If stable discharge in the morning.  May require oxygen short-term.  Continue supportive care with nebulizer treatments, stop steroids.  2.  Obesity.  Follow with PCP.  3.  History of smoking.  Counseled to quit.  On nicotine patch.  4.  COPD.  Chronic and at baseline.  Stop steroids.  5. HTN - on HCTZ.    Family Communication  :  Family bedside  Code Status :  Full  Disposition Plan  :  Home in am  Consults  :  None  Procedures  :     DVT Prophylaxis  :  Lovenox   Lab Results  Component Value Date   PLT 214 05/08/2018    Diet :  Diet Order            Diet heart healthy/carb modified Room service appropriate? Yes; Fluid  consistency: Thin  Diet effective now               Inpatient Medications Scheduled Meds: . doxycycline  100 mg Oral Q12H  . enoxaparin (LOVENOX) injection  40 mg Subcutaneous QHS  . hydrochlorothiazide  25 mg Oral Daily  . ipratropium-albuterol  3 mL  Nebulization Q4H  . nicotine  21 mg Transdermal Daily   Continuous Infusions:  PRN Meds:.acetaminophen, albuterol, chlorpheniramine-HYDROcodone, hydrALAZINE, ondansetron (ZOFRAN) IV, zolpidem  Antibiotics  :   Anti-infectives (From admission, onward)   Start     Dose/Rate Route Frequency Ordered Stop   05/09/18 1145  doxycycline (VIBRA-TABS) tablet 100 mg     100 mg Oral Every 12 hours 05/09/18 1141     05/08/18 2230  cefTRIAXone (ROCEPHIN) 1 g in sodium chloride 0.9 % 100 mL IVPB  Status:  Discontinued     1 g 200 mL/hr over 30 Minutes Intravenous Every 24 hours 05/08/18 2215 05/09/18 1141   05/08/18 2230  azithromycin (ZITHROMAX) 500 mg in sodium chloride 0.9 % 250 mL IVPB  Status:  Discontinued     500 mg 250 mL/hr over 60 Minutes Intravenous Every 24 hours 05/08/18 2215 05/09/18 1141   05/08/18 2115  cefTRIAXone (ROCEPHIN) 1 g in sodium chloride 0.9 % 100 mL IVPB     1 g 200 mL/hr over 30 Minutes Intravenous  Once 05/08/18 2113 05/08/18 2233   05/08/18 2115  azithromycin (ZITHROMAX) 500 mg in sodium chloride 0.9 % 250 mL IVPB     500 mg 250 mL/hr over 60 Minutes Intravenous  Once 05/08/18 2113 05/08/18 2317          Objective:   Vitals:   05/09/18 0632 05/09/18 0850 05/09/18 1017 05/09/18 1130  BP: 137/80     Pulse: (!) 56     Resp: (!) 22     Temp: 98.1 F (36.7 C)     TempSrc: Oral     SpO2: 96% 92% (!) 83% 91%  Weight:      Height:        Wt Readings from Last 3 Encounters:  05/09/18 100.9 kg     Intake/Output Summary (Last 24 hours) at 05/09/2018 1141 Last data filed at 05/09/2018 0400 Gross per 24 hour  Intake 125 ml  Output 400 ml  Net -275 ml     Physical Exam  Awake Alert, Oriented X 3,  No new F.N deficits, Normal affect [place].AT,PERRAL Supple Neck,No JVD, No cervical lymphadenopathy appriciated.  Symmetrical Chest wall movement, Good air movement bilaterally, few coarse B sounds RRR,No Gallops,Rubs or new Murmurs, No Parasternal Heave +ve B.Sounds, Abd Soft, No tenderness, No organomegaly appriciated, No rebound - guarding or rigidity. No Cyanosis, Clubbing or edema, No new Rash or bruise      Data Review:    CBC Recent Labs  Lab 05/08/18 1800  WBC 5.9  HGB 16.5*  HCT 50.2*  PLT 214  MCV 96.4  MCH 31.7  MCHC 32.9  RDW 13.1    Chemistries  Recent Labs  Lab 05/08/18 1800  NA 141  K 3.7  CL 103  CO2 31  GLUCOSE 97  BUN 13  CREATININE 0.71  CALCIUM 8.9   ------------------------------------------------------------------------------------------------------------------ No results for input(s): CHOL, HDL, LDLCALC, TRIG, CHOLHDL, LDLDIRECT in the last 72 hours.  No results found for: HGBA1C ------------------------------------------------------------------------------------------------------------------ No results for input(s): TSH, T4TOTAL, T3FREE, THYROIDAB in the last 72 hours.  Invalid input(s): FREET3 ------------------------------------------------------------------------------------------------------------------ No results for input(s): VITAMINB12, FOLATE, FERRITIN, TIBC, IRON, RETICCTPCT in the last 72 hours.  Coagulation profile No results for input(s): INR, PROTIME in the last 168 hours.  No results for input(s): DDIMER in the last 72 hours.  Cardiac Enzymes No results for input(s): CKMB, TROPONINI, MYOGLOBIN in the last 168 hours.  Invalid input(s): CK ------------------------------------------------------------------------------------------------------------------    Component  Value Date/Time   BNP 55.5 05/08/2018 1800    Micro Results Recent Results (from the past 240 hour(s))  Respiratory Panel by PCR     Status: Abnormal    Collection Time: 05/08/18 10:51 PM  Result Value Ref Range Status   Adenovirus NOT DETECTED NOT DETECTED Final   Coronavirus 229E NOT DETECTED NOT DETECTED Final   Coronavirus HKU1 NOT DETECTED NOT DETECTED Final   Coronavirus NL63 NOT DETECTED NOT DETECTED Final   Coronavirus OC43 NOT DETECTED NOT DETECTED Final   Metapneumovirus NOT DETECTED NOT DETECTED Final   Rhinovirus / Enterovirus DETECTED (A) NOT DETECTED Final   Influenza A NOT DETECTED NOT DETECTED Final   Influenza B NOT DETECTED NOT DETECTED Final   Parainfluenza Virus 1 NOT DETECTED NOT DETECTED Final   Parainfluenza Virus 2 NOT DETECTED NOT DETECTED Final   Parainfluenza Virus 3 NOT DETECTED NOT DETECTED Final   Parainfluenza Virus 4 NOT DETECTED NOT DETECTED Final   Respiratory Syncytial Virus NOT DETECTED NOT DETECTED Final   Bordetella pertussis NOT DETECTED NOT DETECTED Final   Chlamydophila pneumoniae NOT DETECTED NOT DETECTED Final   Mycoplasma pneumoniae NOT DETECTED NOT DETECTED Final    Comment: Performed at [facility], [place] [address] [place], [place] [address]  Culture, sputum-assessment     Status: None   Collection Time: 05/09/18 12:18 AM  Result Value Ref Range Status   Specimen Description SPUTUM  Final   Special Requests NONE  Final   Sputum evaluation   Final    Sputum specimen not acceptable for testing.  Please recollect.   Results Called to: Laurena Spies, RN AT [phone] ON 05/09/18 BY C. JESSUP, MLT. Performed at [facility], [place] [address] [place], [place] [address]    Report Status 05/09/2018 FINAL  Final    Radiology Reports  Dg Chest 2 View  Result Date: 05/08/2018 CLINICAL DATA:  Cough and shortness of breath EXAM: CHEST - 2 VIEW COMPARISON:  05/05/2014 FINDINGS: Hyperinflation. Diffuse increased interstitial opacity. No pleural effusion. No focal infiltrate. Normal heart size. No pneumothorax. IMPRESSION: Diffusely increased interstitial opacity, suspect for diffuse interstitial  inflammatory process. There is no focal airspace disease. Electronically Signed   By: Donavan Foil M.D.   On: 05/08/2018 18:28    Time Spent in minutes  30   [name] M.D on 05/09/2018 at 11:41 AM  To page go to [email or web address] - password [facility]

## 2018-05-09 NOTE — Progress Notes (Signed)
Ambulated patient to bathroom without O2 she Desaturated to 83%.

## 2018-05-09 NOTE — Progress Notes (Signed)
Nutrition Brief Note  Consult received via COPD Gold protocol for Assessment. PMH includes diet-controlled DM, COPD, HTN  Wt Readings from Last 15 Encounters:  05/09/18 100.9 kg   Body mass index is 42.03 kg/m. Patient meets criteria for morbid obesity based on current BMI. Pt denies any unintentional weight loss. Reports she gained weight recently when she went on a 2 week vacation.   Current diet order is Heart Healthy/Carb Modified, no recorded po intake but pt reports good appetite currently and PTA.  Labs and medications reviewed.   Pt denies that her breathing related to COPD affects her ability to prepare meals. Reports this has not affected her appetite or po intake  No nutrition interventions warranted at this time. If nutrition issues arise, please consult RD.   Kerman Passey MS, RD, [place], [place] [phone] Pager  [phone] Weekend/On-Call Pager

## 2018-05-10 ENCOUNTER — Observation Stay (HOSPITAL_COMMUNITY): Payer: Self-pay

## 2018-05-10 LAB — LEGIONELLA PNEUMOPHILA SEROGP 1 UR AG: L. pneumophila Serogp 1 Ur Ag: NEGATIVE

## 2018-05-10 LAB — BASIC METABOLIC PANEL
ANION GAP: 9 (ref 5–15)
BUN: 14 mg/dL (ref 8–23)
CALCIUM: 9.2 mg/dL (ref 8.9–10.3)
CHLORIDE: 103 mmol/L (ref 98–111)
CO2: 30 mmol/L (ref 22–32)
Creatinine, Ser: 0.72 mg/dL (ref 0.44–1.00)
GFR calc non Af Amer: >60 mL/min (ref >60)
GLUCOSE: 108 mg/dL — AB (ref 70–99)
POTASSIUM: 4.1 mmol/L (ref 3.5–5.1)
Sodium: 142 mmol/L (ref 135–145)

## 2018-05-10 LAB — GLUCOSE, CAPILLARY: GLUCOSE-CAPILLARY: 156 mg/dL — AB (ref 70–99)

## 2018-05-10 LAB — BRAIN NATRIURETIC PEPTIDE: B Natriuretic Peptide: 149.6 pg/mL — ABNORMAL HIGH (ref 0.0–100.0)

## 2018-05-10 LAB — HIV ANTIBODY (ROUTINE TESTING W REFLEX): HIV Screen 4th Generation wRfx: NONREACTIVE

## 2018-05-10 MED ORDER — NICOTINE 21 MG/24HR TD PT24: 21.0000 mg | MEDICATED_PATCH | TRANSDERMAL | 0 refills | Status: AC

## 2018-05-10 MED ORDER — PREDNISONE 5 MG PO TABS: ORAL_TABLET | ORAL | 0 refills | Status: DC

## 2018-05-10 MED ORDER — DOXYCYCLINE HYCLATE 100 MG PO TABS: 100.0000 mg | ORAL_TABLET | Freq: Two times a day (BID) | ORAL | 0 refills | Status: DC

## 2018-05-10 MED ORDER — IPRATROPIUM-ALBUTEROL 0.5-2.5 (3) MG/3ML IN SOLN: RESPIRATORY_TRACT | 0 refills | Status: DC

## 2018-05-10 NOTE — Progress Notes (Signed)
Nsg Discharge Note  Admit Date:  05/08/2018 Discharge date: 05/10/2018   [name] to be discharged home per MD order.  AVS completed and given to patient along with printed prescriptions.   Discharge Medication: Allergies as of 05/10/2018      Reactions   Mucinex [guaifenesin Er] Diarrhea   Singulair [montelukast Sodium] Other (See Comments)   headaches      Medication List    TAKE these medications   albuterol 108 (90 Base) MCG/ACT inhaler Commonly known as:  PROVENTIL HFA;VENTOLIN HFA Inhale 1 puff into the lungs every 6 (six) hours as needed for wheezing or shortness of breath.   ALKA-SELTZER PLS NIGHT CLD/FLU PO Take 2 tablets by mouth as needed (cold syptoms).   doxycycline 100 MG tablet Commonly known as:  VIBRA-TABS Take 1 tablet (100 mg total) by mouth every 12 (twelve) hours.   hydrochlorothiazide 25 MG tablet Commonly known as:  HYDRODIURIL Take 25 mg by mouth daily.   ipratropium-albuterol 0.5-2.5 (3) MG/3ML Soln Commonly known as:  DUONEB Use twice a day scheduled and every 4 hours as needed for shortness of breath and wheezing   nicotine 21 mg/24hr patch Commonly known as:  NICODERM CQ - dosed in mg/24 hours Place 1 patch (21 mg total) onto the skin daily.   predniSONE 5 MG tablet Commonly known as:  DELTASONE Label  & dispense according to the schedule below. Take 8 Pills PO for 3 days, 6 Pills PO for 3 days, 4 Pills PO for 3 days, 2 Pills PO for 3 days, 1 Pills PO for 3 days, 1/2 Pill  PO for 3 days then STOP. Total 65 pills.            Durable Medical Equipment  (From admission, onward)         Start     Ordered   05/10/18 0000  DME Nebulizer/meds    Question:  Patient needs a nebulizer to treat with the following condition  Answer:  COPD (chronic obstructive pulmonary disease) ([place])   05/10/18 0930   05/10/18 0000  For home use only DME oxygen    Question Answer Comment  Mode or (Route) Nasal cannula   Liters per Minute 2   Frequency  Continuous (stationary and portable oxygen unit needed)   Oxygen conserving device Yes   Oxygen delivery system Gas      05/10/18 0930          Discharge Assessment: Vitals:   05/10/18 0856 05/10/18 1139  BP:    Pulse:    Resp:    Temp:    SpO2: 96% 93%   Skin clean, dry and intact without evidence of skin break down, no evidence of skin tears noted. IV catheter discontinued intact. Dressing with slight pressure applied.  D/c Instructions-Education: Discharge instructions and printed prescriptions given to patient and family with verbalized understanding. D/c education completed with patient/family including follow up instructions, medication list, prednisone calendar - patient able to verbalize understanding, all questions fully answered. [facility] brought DME oxygen to room as well as information packet.  Patient instructed to return to ED, call 911, or call MD for any changes in condition.   Patient escorted via [place], and D/C home via private auto.  Ginette Pitman, RN 05/10/2018 2:33 PM

## 2018-05-10 NOTE — Discharge Instructions (Signed)
Follow with Primary MD [facility], [facility] in 7 days   Get CBC, CMP, 2 view Chest X ray checked  by Primary MD  in 5-7 days    Activity: As tolerated with Full fall precautions use walker/cane & assistance as needed, wear facemask around children and other people tell you have cough and fever  Disposition Home    Diet: Heart Healthy   For Heart failure patients - Check your Weight same time everyday, if you gain over 2 pounds, or you develop in leg swelling, experience more shortness of breath or chest pain, call your Primary MD immediately. Follow Cardiac Low Salt Diet and 1.5 lit/day fluid restriction.  Special Instructions: If you have smoked or chewed Tobacco  in the last 2 yrs please stop smoking, stop any regular Alcohol  and or any Recreational drug use.  On your next visit with your primary care physician please Get Medicines reviewed and adjusted.  Please request your Prim.MD to go over all Hospital Tests and Procedure/Radiological results at the follow up, please get all Hospital records sent to your Prim MD by signing hospital release before you go home.  If you experience worsening of your admission symptoms, develop shortness of breath, life threatening emergency, suicidal or homicidal thoughts you must seek medical attention immediately by calling 911 or calling your MD immediately  if symptoms less severe.  You Must read complete instructions/literature along with all the possible adverse reactions/side effects for all the Medicines you take and that have been prescribed to you. Take any new Medicines after you have completely understood and accpet all the possible adverse reactions/side effects.

## 2018-05-10 NOTE — Progress Notes (Signed)
SATURATION QUALIFICATIONS: (This note is used to comply with regulatory documentation for home oxygen)  Patient Saturations on Room Air at Rest = 88%  Patient Saturations on Room Air while Ambulating = 82%  Patient Saturations on 2 Liters of oxygen while Ambulating = 86%  Please briefly explain why patient needs home oxygen: 02 sats drop while walking. Upper respiratory wheezing and coughing while walking.

## 2018-05-10 NOTE — Progress Notes (Signed)
Patient ambulated in hallway. O2 saturation was shown to be 77% on Room Air.  Continued ambulation, raised O2 to 1L nasal cannula. O2 saturation was raised to 83%.   Continued ambulation, raised O2 to 2L nasal cannula. O2 saturation was raised to 88%.  Continued ambulation, raised O2 to 3L nasal cannula. O2 saturation was raised to 94%.  While sitting upright in recliner in room off nasal cannula, O2 saturation decreased to 82%. When on 2L, O2 saturation raised to 92%.  Ginette Pitman RN

## 2018-05-10 NOTE — Discharge Summary (Signed)
[name] PPI:[id number] DOB: March 06, 1956 DOA: 05/08/2018  PCP: [facility], [facility]  Admit date: 05/08/2018  Discharge date: 05/10/2018  Admitted From: Home  Disposition:  Home   Recommendations for Outpatient Follow-up:   Follow up with PCP in 1-2 weeks  PCP Please obtain BMP/CBC, 2 view CXR in 1week,  (see Discharge instructions)   PCP Please follow up on the following pending results:    Home Health: None   Equipment/Devices: DME 2lit o2 if qualifies  Consultations: None Discharge Condition: Stable   CODE STATUS: Full   Diet Recommendation: Heart Healthy    Chief Complaint  Patient presents with  . Shortness of Breath     Brief history of present illness from the day of admission and additional interim summary    Leah Lawson a 62 y.o.femalewith medical history significant oftobacco abuse, hypertension, diet-controlled diabetes, COPD, asthma, who presents with cough, shortness breath.                                                                  Hospital Course    1.  Rhinovirus URI mild hypoxia - requiring some oxygen, stop antibiotics and switch to simply oral doxycycline as she might have mild superimposed bacterial bronchitis, producing copious amounts of productive phlegm, encouraged to sit up, flutter valve added, will try to titrate off oxygen today. She is in no distress and resting comfortably in bed, will be discharged home.  Will give her 4 days of doxycycline, short burst of steroid taper due to #4 below, nebulizer treatments and oxygen if she qualifies.  Again strictly counseled to quit smoking.  2.  Obesity.  Follow with PCP.  3.  History of smoking.  Counseled to quit.  On nicotine patch, prescribed 30-day supply.  4.  COPD.  Mild wheezing, likely mild  exacerbation brought on by rhinovirus bronchitis, short burst of steroids, nebulizer treatments, currently looks like she would qualify for home oxygen, will ambulate her on room air if qualifies again 2 L nasal cannula oxygen will be provided upon discharge, strictly counseled to quit smoking.  PCP to monitor.  5. HTN - on HCTZ.    Note talk to patient and her son bedside at least 4 times between yesterday and today, this morning son came to the dictation room and was requested to step out of the dictation room, went again to talk to them today for the third time in the room, I have answered over 50 questions between yesterday and today which are pertaining to rhinovirus.   Discharge diagnosis     Principal Problem:   COPD exacerbation ([place]) Active Problems:   Hypertension   Tobacco abuse   Acute on chronic respiratory failure with hypoxia [facility])    Discharge instructions    Discharge Instructions  DME Nebulizer/meds   Complete by:  As directed    Patient needs a nebulizer to treat with the following condition:  COPD (chronic obstructive pulmonary disease) (HCC)   Diet - low sodium heart healthy   Complete by:  As directed    Discharge instructions   Complete by:  As directed    Follow with Primary MD Medicine[facility] in 7 days   Get CBC, CMP, 2 view Chest X ray checked  by Primary MD  in 5-7 days    Activity: As tolerated with Full fall precautions use walker/cane & assistance as needed, wear facemask around children and other people tell you have cough and fever  Disposition Home    Diet: Heart Healthy   For Heart failure patients - Check your Weight same time everyday, if you gain over 2 pounds, or you develop in leg swelling, experience more shortness of breath or chest pain, call your Primary MD immediately. Follow Cardiac Low Salt Diet and 1.5 lit/day fluid restriction.  Special Instructions: If you have smoked or chewed Tobacco  in the last 2  yrs please stop smoking, stop any regular Alcohol  and or any Recreational drug use.  On your next visit with your primary care physician please Get Medicines reviewed and adjusted.  Please request your Prim.MD to go over all Hospital Tests and Procedure/Radiological results at the follow up, please get all Hospital records sent to your Prim MD by signing hospital release before you go home.  If you experience worsening of your admission symptoms, develop shortness of breath, life threatening emergency, suicidal or homicidal thoughts you must seek medical attention immediately by calling 911 or calling your MD immediately  if symptoms less severe.  You Must read complete instructions/literature along with all the possible adverse reactions/side effects for all the Medicines you take and that have been prescribed to you. Take any new Medicines after you have completely understood and accpet all the possible adverse reactions/side effects.   For home use only DME oxygen   Complete by:  As directed    Mode or (Route):  Nasal cannula   Liters per Minute:  2   Frequency:  Continuous (stationary and portable oxygen unit needed)   Oxygen conserving device:  Yes   Oxygen delivery system:  Gas   Increase activity slowly   Complete by:  As directed       Discharge Medications   Allergies as of 05/10/2018      Reactions   Mucinex [guaifenesin Er] Diarrhea   Singulair [montelukast Sodium] Other (See Comments)   headaches      Medication List    TAKE these medications   albuterol 108 (90 Base) MCG/ACT inhaler Commonly known as:  PROVENTIL HFA;VENTOLIN HFA Inhale 1 puff into the lungs every 6 (six) hours as needed for wheezing or shortness of breath.   ALKA-SELTZER PLS NIGHT CLD/FLU PO Take 2 tablets by mouth as needed (cold syptoms).   doxycycline 100 MG tablet Commonly known as:  VIBRA-TABS Take 1 tablet (100 mg total) by mouth every 12 (twelve) hours.   hydrochlorothiazide 25 MG  tablet Commonly known as:  HYDRODIURIL Take 25 mg by mouth daily.   ipratropium-albuterol 0.5-2.5 (3) MG/3ML Soln Commonly known as:  DUONEB Use twice a day scheduled and every 4 hours as needed for shortness of breath and wheezing   nicotine 21 mg/24hr patch Commonly known as:  NICODERM CQ - dosed in mg/24 hours Place 1 patch (21  mg total) onto the skin daily.   predniSONE 5 MG tablet Commonly known as:  DELTASONE Label  & dispense according to the schedule below. Take 8 Pills PO for 3 days, 6 Pills PO for 3 days, 4 Pills PO for 3 days, 2 Pills PO for 3 days, 1 Pills PO for 3 days, 1/2 Pill  PO for 3 days then STOP. Total 65 pills.            Durable Medical Equipment  (From admission, onward)         Start     Ordered   05/10/18 0000  DME Nebulizer/meds    Question:  Patient needs a nebulizer to treat with the following condition  Answer:  COPD (chronic obstructive pulmonary disease) ([place])   05/10/18 0930   05/10/18 0000  For home use only DME oxygen    Question Answer Comment  Mode or (Route) Nasal cannula   Liters per Minute 2   Frequency Continuous (stationary and portable oxygen unit needed)   Oxygen conserving device Yes   Oxygen delivery system Gas      05/10/18 0930          Follow-up Information    [facility]. Schedule an appointment as soon as possible for a visit in 1 week(s).   Contact information: [place] [address] [phone]           Major procedures and Radiology Reports - PLEASE review detailed and final reports thoroughly  -         Dg Chest 2 View  Result Date: 05/08/2018 CLINICAL DATA:  Cough and shortness of breath EXAM: CHEST - 2 VIEW COMPARISON:  05/05/2014 FINDINGS: Hyperinflation. Diffuse increased interstitial opacity. No pleural effusion. No focal infiltrate. Normal heart size. No pneumothorax. IMPRESSION: Diffusely increased interstitial opacity, suspect for diffuse interstitial  inflammatory process. There is no focal airspace disease. Electronically Signed   By: Donavan Foil M.D.   On: 05/08/2018 18:28   Dg Chest Port 1 View  Result Date: 05/10/2018 CLINICAL DATA:  62 y.o. female with medical history significant of tobacco abuse, hypertension, diet-controlled diabetes, COPD, asthma, who presents with cough, shortness breath. EXAM: PORTABLE CHEST - 1 VIEW COMPARISON:  05/08/2018 FINDINGS: 13 mm nodular opacity projecting of the left lung base, not evident on prior study. Slight decrease in interstitial prominence in the lung bases since previous exam. Heart size and mediastinal contours are within normal limits. No effusion. Visualized bones unremarkable. IMPRESSION: 1. Interval improvement in bibasilar interstitial process since previous day's examination. 2. 13 mm nodular opacity at the left lung base, not seen previously. Recommend attention on follow-up studies. Electronically Signed   By: Lucrezia Europe M.D.   On: 05/10/2018 08:15    Micro Results     Recent Results (from the past 240 hour(s))  Blood culture (routine x 2)     Status: None (Preliminary result)   Collection Time: 05/08/18  9:39 PM  Result Value Ref Range Status   Specimen Description BLOOD RIGHT ANTECUBITAL  Final   Special Requests   Final    BOTTLES DRAWN AEROBIC AND ANAEROBIC Blood Culture adequate volume   Culture   Final    NO GROWTH < 24 HOURS Performed at [facility], [address], [place], [place] [address]    Report Status PENDING  Incomplete  Blood culture (routine x 2)     Status: None (Preliminary result)   Collection Time: 05/08/18  9:55 PM  Result Value Ref Range Status   Specimen Description BLOOD RIGHT ANTECUBITAL  Final   Special Requests   Final    BOTTLES DRAWN AEROBIC AND ANAEROBIC Blood Culture adequate volume   Culture   Final    NO GROWTH < 24 HOURS Performed at [facility], [address], [place], [place] [address]    Report Status PENDING  Incomplete    Respiratory Panel by PCR     Status: Abnormal   Collection Time: 05/08/18 10:51 PM  Result Value Ref Range Status   Adenovirus NOT DETECTED NOT DETECTED Final   Coronavirus 229E NOT DETECTED NOT DETECTED Final   Coronavirus HKU1 NOT DETECTED NOT DETECTED Final   Coronavirus NL63 NOT DETECTED NOT DETECTED Final   Coronavirus OC43 NOT DETECTED NOT DETECTED Final   Metapneumovirus NOT DETECTED NOT DETECTED Final   Rhinovirus / Enterovirus DETECTED (A) NOT DETECTED Final   Influenza A NOT DETECTED NOT DETECTED Final   Influenza B NOT DETECTED NOT DETECTED Final   Parainfluenza Virus 1 NOT DETECTED NOT DETECTED Final   Parainfluenza Virus 2 NOT DETECTED NOT DETECTED Final   Parainfluenza Virus 3 NOT DETECTED NOT DETECTED Final   Parainfluenza Virus 4 NOT DETECTED NOT DETECTED Final   Respiratory Syncytial Virus NOT DETECTED NOT DETECTED Final   Bordetella pertussis NOT DETECTED NOT DETECTED Final   Chlamydophila pneumoniae NOT DETECTED NOT DETECTED Final   Mycoplasma pneumoniae NOT DETECTED NOT DETECTED Final    Comment: Performed at [facility], [place] [address], [place], [place] [address]  Culture, sputum-assessment     Status: None   Collection Time: 05/09/18 12:18 AM  Result Value Ref Range Status   Specimen Description SPUTUM  Final   Special Requests NONE  Final   Sputum evaluation   Final    Sputum specimen not acceptable for testing.  Please recollect.   Results Called to: Laurena Spies, RN AT [phone] ON 05/09/18 BY C. JESSUP, MLT. Performed at [facility], [place] [address], [place], [place] [address]    Report Status 05/09/2018 FINAL  Final    Today   Subjective    Leah Lawson today has no headache,no chest abdominal pain,no new weakness tingling or numbness, feels much better wants to go home today.     Objective   Blood pressure 118/86, pulse 72, temperature 98.1 F (36.7 C), resp. rate 20, height 5\' 1"  (1.549 m), weight 100.9 kg, SpO2 96 %.  No intake or  output data in the 24 hours ending 05/10/18 0931  Exam Awake Alert, Oriented x 3, No new F.N deficits, Normal affect .AT,PERRAL Supple Neck,No JVD, No cervical lymphadenopathy appriciated.  Symmetrical Chest wall movement, Good air movement bilaterally, mild wheezing RRR,No Gallops,Rubs or new Murmurs, No Parasternal Heave +ve B.Sounds, Abd Soft, Non tender, No organomegaly appriciated, No rebound -guarding or rigidity. No Cyanosis, Clubbing or edema, No new Rash or bruise   Data Review   CBC w Diff:  Lab Results  Component Value Date   WBC 5.9 05/08/2018   HGB 16.5 (H) 05/08/2018   HCT 50.2 (H) 05/08/2018   PLT 214 05/08/2018   LYMPHOPCT 35 08/28/2010   MONOPCT 6 08/28/2010   EOSPCT 4 08/28/2010   BASOPCT 0 08/28/2010    CMP:  Lab Results  Component Value Date   NA 142 05/10/2018   K 4.1 05/10/2018   CL 103 05/10/2018   CO2 30 05/10/2018   BUN 14 05/10/2018  CREATININE 0.72 05/10/2018   PROT 7.3 08/28/2010   ALBUMIN 4.5 08/28/2010   BILITOT 0.4 08/28/2010   ALKPHOS 79 08/28/2010   AST 12 08/28/2010   ALT 12 08/28/2010  .   Total Time in preparing paper work, data evaluation and todays exam - 21 minutes  [name] M.D on 05/10/2018 at 9:31 AM  [facility]   Office  [phone]

## 2018-05-10 NOTE — Care Management Note (Signed)
Case Management Note  Patient Details  Name: Alixandra Alfieri MRN: [id number] Date of Birth: [birth year]  Subjective/Objective:                    Action/Plan:  Spoke w patient at bedside, son present. Provided with [place] letter, duoneb over ride for cost, nebulizer for home use, and home oxygen. Patient states she has orange card for refills, and PCP at [facility]. No other CM needs at this time.   Expected Discharge Date:  05/10/18               Expected Discharge Plan:  Home/Self Care  In-House Referral:     Discharge planning Services  CM Consult, [place] [facility]  Post Acute Care Choice:  Durable Medical Equipment Choice offered to:     DME Arranged:  Nebulizer machine, Oxygen DME Agency:  [place]:    [facility] Agency:     Status of Service:  Completed, signed off  If discussed at [place] of Stay Meetings, dates discussed:    Additional Comments:  Carles Collet, RN 05/10/2018, 10:34 AM

## 2018-05-13 LAB — CULTURE, BLOOD (ROUTINE X 2)
CULTURE: NO GROWTH
Culture: NO GROWTH
SPECIAL REQUESTS: ADEQUATE
SPECIAL REQUESTS: ADEQUATE

## 2019-10-12 ENCOUNTER — Other Ambulatory Visit: Payer: Self-pay

## 2019-11-25 ENCOUNTER — Other Ambulatory Visit: Payer: Self-pay

## 2019-11-25 ENCOUNTER — Inpatient Hospital Stay (HOSPITAL_COMMUNITY)
Admission: EM | Admit: 2019-11-25 | Discharge: 2019-11-30 | DRG: 177 | Disposition: A | Discharge status: Home / Self Care | Payer: HRSA Program | Attending: Internal Medicine | Admitting: Internal Medicine

## 2019-11-25 ENCOUNTER — Emergency Department (HOSPITAL_COMMUNITY): Payer: HRSA Program

## 2019-11-25 ENCOUNTER — Encounter (HOSPITAL_COMMUNITY): Payer: Self-pay

## 2019-11-25 DIAGNOSIS — J1282 Pneumonia due to coronavirus disease 2019: Secondary | ICD-10-CM | POA: Diagnosis present

## 2019-11-25 DIAGNOSIS — Z87891 Personal history of nicotine dependence: Secondary | ICD-10-CM | POA: Diagnosis not present

## 2019-11-25 DIAGNOSIS — U071 COVID-19: Secondary | ICD-10-CM | POA: Diagnosis present

## 2019-11-25 DIAGNOSIS — Z888 Allergy status to other drugs, medicaments and biological substances status: Secondary | ICD-10-CM | POA: Diagnosis not present

## 2019-11-25 DIAGNOSIS — J9601 Acute respiratory failure with hypoxia: Secondary | ICD-10-CM | POA: Diagnosis present

## 2019-11-25 DIAGNOSIS — Z79899 Other long term (current) drug therapy: Secondary | ICD-10-CM | POA: Diagnosis not present

## 2019-11-25 DIAGNOSIS — Z6841 Body Mass Index (BMI) 40.0 and over, adult: Secondary | ICD-10-CM | POA: Diagnosis not present

## 2019-11-25 DIAGNOSIS — E119 Type 2 diabetes mellitus without complications: Secondary | ICD-10-CM

## 2019-11-25 DIAGNOSIS — J440 Chronic obstructive pulmonary disease with acute lower respiratory infection: Secondary | ICD-10-CM | POA: Diagnosis present

## 2019-11-25 DIAGNOSIS — J441 Chronic obstructive pulmonary disease with (acute) exacerbation: Secondary | ICD-10-CM | POA: Diagnosis present

## 2019-11-25 DIAGNOSIS — I10 Essential (primary) hypertension: Secondary | ICD-10-CM | POA: Diagnosis present

## 2019-11-25 LAB — RESPIRATORY PANEL BY RT PCR (FLU A&B, COVID)
Influenza A by PCR: NEGATIVE
Influenza B by PCR: NEGATIVE
SARS Coronavirus 2 by RT PCR: POSITIVE — AB

## 2019-11-25 LAB — COMPREHENSIVE METABOLIC PANEL
ALT: 20 U/L (ref 0–44)
AST: 31 U/L (ref 15–41)
Albumin: 3.2 g/dL — ABNORMAL LOW (ref 3.5–5.0)
Alkaline Phosphatase: 76 U/L (ref 38–126)
Anion gap: 10 (ref 5–15)
BUN: 14 mg/dL (ref 8–23)
CO2: 28 mmol/L (ref 22–32)
Calcium: 8.1 mg/dL — ABNORMAL LOW (ref 8.9–10.3)
Chloride: 93 mmol/L — ABNORMAL LOW (ref 98–111)
Creatinine, Ser: 0.73 mg/dL (ref 0.44–1.00)
GFR calc Af Amer: >60 mL/min (ref >60)
GFR calc non Af Amer: >60 mL/min (ref >60)
Glucose, Bld: 132 mg/dL — ABNORMAL HIGH (ref 70–99)
Potassium: 3.4 mmol/L — ABNORMAL LOW (ref 3.5–5.1)
Sodium: 131 mmol/L — ABNORMAL LOW (ref 135–145)
Total Bilirubin: 0.6 mg/dL (ref 0.3–1.2)
Total Protein: 6.5 g/dL (ref 6.5–8.1)

## 2019-11-25 LAB — CBC WITH DIFFERENTIAL/PLATELET
Abs Immature Granulocytes: 0.61 10*3/uL — ABNORMAL HIGH (ref 0.00–0.07)
Basophils Absolute: 0.1 10*3/uL (ref 0.0–0.1)
Basophils Relative: 1 %
Eosinophils Absolute: 0 10*3/uL (ref 0.0–0.5)
Eosinophils Relative: 0 %
HCT: 49.2 % — ABNORMAL HIGH (ref 36.0–46.0)
Hemoglobin: 15.5 g/dL — ABNORMAL HIGH (ref 12.0–15.0)
Immature Granulocytes: 8 %
Lymphocytes Relative: 14 %
Lymphs Abs: 1.1 10*3/uL (ref 0.7–4.0)
MCH: 27.6 pg (ref 26.0–34.0)
MCHC: 31.5 g/dL (ref 30.0–36.0)
MCV: 87.5 fL (ref 80.0–100.0)
Monocytes Absolute: 0.5 10*3/uL (ref 0.1–1.0)
Monocytes Relative: 6 %
Neutro Abs: 5.3 10*3/uL (ref 1.7–7.7)
Neutrophils Relative %: 71 %
Platelets: 211 10*3/uL (ref 150–400)
RBC: 5.62 MIL/uL — ABNORMAL HIGH (ref 3.87–5.11)
RDW: 16.3 % — ABNORMAL HIGH (ref 11.5–15.5)
WBC: 7.6 10*3/uL (ref 4.0–10.5)
nRBC: 2.5 % — ABNORMAL HIGH (ref 0.0–0.2)

## 2019-11-25 LAB — D-DIMER, QUANTITATIVE: D-Dimer, Quant: 0.66 ug/mL-FEU — ABNORMAL HIGH (ref 0.00–0.50)

## 2019-11-25 LAB — TROPONIN I (HIGH SENSITIVITY)
Troponin I (High Sensitivity): 31 ng/L — ABNORMAL HIGH (ref <18)
Troponin I (High Sensitivity): 31 ng/L — ABNORMAL HIGH (ref <18)

## 2019-11-25 LAB — LACTATE DEHYDROGENASE: LDH: 319 U/L — ABNORMAL HIGH (ref 98–192)

## 2019-11-25 LAB — LACTIC ACID, PLASMA
Lactic Acid, Venous: 0.9 mmol/L (ref 0.5–1.9)
Lactic Acid, Venous: 1 mmol/L (ref 0.5–1.9)

## 2019-11-25 LAB — POC SARS CORONAVIRUS 2 AG -  ED: SARS Coronavirus 2 Ag: NEGATIVE

## 2019-11-25 LAB — TRIGLYCERIDES: Triglycerides: 108 mg/dL (ref <150)

## 2019-11-25 LAB — FERRITIN: Ferritin: 48 ng/mL (ref 11–307)

## 2019-11-25 LAB — FIBRINOGEN: Fibrinogen: 433 mg/dL (ref 210–475)

## 2019-11-25 LAB — BRAIN NATRIURETIC PEPTIDE: B Natriuretic Peptide: 194.8 pg/mL — ABNORMAL HIGH (ref 0.0–100.0)

## 2019-11-25 LAB — C-REACTIVE PROTEIN: CRP: 6.8 mg/dL — ABNORMAL HIGH (ref <1.0)

## 2019-11-25 LAB — PROCALCITONIN: Procalcitonin: <0.1 ng/mL

## 2019-11-25 MED ORDER — DEXAMETHASONE 6 MG PO TABS: 6.0000 mg | ORAL_TABLET | ORAL | Status: DC

## 2019-11-25 MED ORDER — SODIUM CHLORIDE 0.9 % IV SOLN
100.0000 mg | Freq: Every day | INTRAVENOUS | Status: AC
  Administered 2019-11-26 – 2019-11-29 (×4): 100 mg via INTRAVENOUS
  Filled 2019-11-25 (×5): qty 20

## 2019-11-25 MED ORDER — IPRATROPIUM-ALBUTEROL 20-100 MCG/ACT IN AERS
1.0000 | INHALATION_SPRAY | Freq: Four times a day (QID) | RESPIRATORY_TRACT | Status: DC
  Administered 2019-11-26 (×2): 1 via RESPIRATORY_TRACT
  Filled 2019-11-25: qty 4

## 2019-11-25 MED ORDER — SODIUM CHLORIDE 0.9 % IV SOLN
200.0000 mg | Freq: Once | INTRAVENOUS | Status: AC
  Administered 2019-11-25: 200 mg via INTRAVENOUS
  Filled 2019-11-25: qty 40

## 2019-11-25 MED ORDER — INSULIN ASPART 100 UNIT/ML ~~LOC~~ SOLN
0.0000 [IU] | Freq: Three times a day (TID) | SUBCUTANEOUS | Status: DC
  Administered 2019-11-26: 2 [IU] via SUBCUTANEOUS
  Administered 2019-11-26: 1 [IU] via SUBCUTANEOUS
  Administered 2019-11-27 – 2019-11-28 (×4): 2 [IU] via SUBCUTANEOUS
  Administered 2019-11-28 (×2): 3 [IU] via SUBCUTANEOUS
  Administered 2019-11-29 (×2): 2 [IU] via SUBCUTANEOUS

## 2019-11-25 MED ORDER — DEXAMETHASONE SODIUM PHOSPHATE 10 MG/ML IJ SOLN
10.0000 mg | Freq: Once | INTRAMUSCULAR | Status: AC
  Administered 2019-11-25: 10 mg via INTRAVENOUS
  Filled 2019-11-25: qty 1

## 2019-11-25 MED ORDER — ENOXAPARIN SODIUM 40 MG/0.4ML ~~LOC~~ SOLN
40.0000 mg | Freq: Every day | SUBCUTANEOUS | Status: DC
  Administered 2019-11-26: 40 mg via SUBCUTANEOUS
  Filled 2019-11-25: qty 0.4

## 2019-11-25 NOTE — ED Notes (Signed)
Leah Lawson daughter [id number] looking for an update on pt

## 2019-11-25 NOTE — ED Triage Notes (Signed)
Pt reports worsening SOB over the past week, tested positive for COVID last week. Pt 66% on room air. NRB applied in triage

## 2019-11-25 NOTE — ED Provider Notes (Signed)
[facility] EMERGENCY DEPARTMENT Provider Note   CSN: [id number] Arrival date & time: 11/25/19  1802     History Chief Complaint  Patient presents with  . COVID+  . Shortness of Breath    Leah Lawson is a 64 y.o. female. Presents ER with shortness of breath in setting of COVID-19. Symptoms for about 2 weeks, worsening shortness of breath for the last few days. No chest pain. No longer having fevers. Cough, nonproductive. No blood. Generalized myalgias, occasional headache.  History of COPD, prediabetes.  HPI     Past Medical History:  Diagnosis Date  . Asthma   . COPD (chronic obstructive pulmonary disease) ([place])   . Diabetes mellitus without complication ([place])    "pre-diabetic"  . Hypertension     Patient Active Problem List   Diagnosis Date Noted  . Pneumonia due to COVID-19 virus 11/25/2019  . Obesity, Class III, BMI 40-49.9 (morbid obesity) ([place]) 11/25/2019  . Tobacco abuse 05/08/2018  . COPD exacerbation ([place]) 05/08/2018  . Acute on chronic respiratory failure with hypoxia ([place]) 05/08/2018  . Hypertension   . Diabetes mellitus without complication [facility])     Past Surgical History:  Procedure Laterality Date  . BREAST SURGERY       OB History   No obstetric history on file.     No family history on file.  Social History   Tobacco Use  . Smoking status: Current Every Day Smoker    Packs/day: 0.50  . Smokeless tobacco: Never Used  Substance Use Topics  . Alcohol use: Not Currently  . Drug use: Never    Home Medications Prior to Admission medications   Medication Sig Start Date End Date Taking? Authorizing Provider  albuterol (PROVENTIL HFA;VENTOLIN HFA) 108 (90 Base) MCG/ACT inhaler Inhale 1 puff into the lungs every 6 (six) hours as needed for wheezing or shortness of breath.    [provider]  doxycycline (VIBRA-TABS) 100 MG tablet Take 1 tablet (100 mg total) by mouth every 12 (twelve) hours. 05/10/18   Thurnell Lose, MD  hydrochlorothiazide (HYDRODIURIL) 25 MG tablet Take 25 mg by mouth daily. 04/08/18   [provider]  ipratropium-albuterol (DUONEB) 0.5-2.5 (3) MG/3ML SOLN Use twice a day scheduled and every 4 hours as needed for shortness of breath and wheezing 05/10/18   Thurnell Lose, MD  Phenyleph-Doxylamine-DM-APAP (ALKA-SELTZER PLS NIGHT CLD/FLU PO) Take 2 tablets by mouth as needed (cold syptoms).    [provider]  predniSONE (DELTASONE) 5 MG tablet Label  & dispense according to the schedule below. Take 8 Pills PO for 3 days, 6 Pills PO for 3 days, 4 Pills PO for 3 days, 2 Pills PO for 3 days, 1 Pills PO for 3 days, 1/2 Pill  PO for 3 days then STOP. Total 65 pills. 05/10/18   Thurnell Lose, MD    Allergies    Mucinex [guaifenesin er] and Singulair [montelukast sodium]  Review of Systems   Review of Systems  Constitutional: Negative for chills and fever.  HENT: Negative for ear pain and sore throat.   Eyes: Negative for pain and visual disturbance.  Respiratory: Positive for cough and shortness of breath.   Cardiovascular: Negative for chest pain and palpitations.  Gastrointestinal: Negative for abdominal pain and vomiting.  Genitourinary: Negative for dysuria and hematuria.  Musculoskeletal: Negative for arthralgias and back pain.  Skin: Negative for color change and rash.  Neurological: Negative for seizures and syncope.  All other systems  reviewed and are negative.   Physical Exam Updated Vital Signs BP 131/78   Pulse 79   Temp 98.6 F (37 C) (Oral)   Resp 19   Ht 5' 1.5" (1.562 m)   Wt 108 kg   SpO2 96%   BMI 44.24 kg/m   Physical Exam Vitals and nursing note reviewed.  Constitutional:      General: She is not in acute distress.    Appearance: She is well-developed.  HENT:     Head: Normocephalic and atraumatic.  Eyes:     Conjunctiva/sclera: Conjunctivae normal.  Cardiovascular:     Rate and Rhythm: Normal rate and regular rhythm.       Heart sounds: No murmur.  Pulmonary:     Comments: Coarse breath sounds bilateral, mild tachypnea, speaking in near full sentences Abdominal:     Palpations: Abdomen is soft.     Tenderness: There is no abdominal tenderness.  Musculoskeletal:     Cervical back: Neck supple.  Skin:    General: Skin is warm and dry.     Capillary Refill: Capillary refill takes less than 2 seconds.  Neurological:     General: No focal deficit present.     Mental Status: She is alert and oriented to person, place, and time.     ED Results / Procedures / Treatments   Labs (all labs ordered are listed, but only abnormal results are displayed) Labs Reviewed  RESPIRATORY PANEL BY RT PCR (FLU A&B, COVID) - Abnormal; Notable for the following components:      Result Value   SARS Coronavirus 2 by RT PCR POSITIVE (*)    All other components within normal limits  CBC WITH DIFFERENTIAL/PLATELET - Abnormal; Notable for the following components:   RBC 5.62 (*)    Hemoglobin 15.5 (*)    HCT 49.2 (*)    RDW 16.3 (*)    nRBC 2.5 (*)    Abs Immature Granulocytes 0.61 (*)    All other components within normal limits  COMPREHENSIVE METABOLIC PANEL - Abnormal; Notable for the following components:   Sodium 131 (*)    Potassium 3.4 (*)    Chloride 93 (*)    Glucose, Bld 132 (*)    Calcium 8.1 (*)    Albumin 3.2 (*)    All other components within normal limits  D-DIMER, QUANTITATIVE (NOT AT [facility]) - Abnormal; Notable for the following components:   D-Dimer, Quant 0.66 (*)    All other components within normal limits  LACTATE DEHYDROGENASE - Abnormal; Notable for the following components:   LDH 319 (*)    All other components within normal limits  C-REACTIVE PROTEIN - Abnormal; Notable for the following components:   CRP 6.8 (*)    All other components within normal limits  BRAIN NATRIURETIC PEPTIDE - Abnormal; Notable for the following components:   B Natriuretic Peptide 194.8 (*)    All other  components within normal limits  TROPONIN I (HIGH SENSITIVITY) - Abnormal; Notable for the following components:   Troponin I (High Sensitivity) 31 (*)    All other components within normal limits  TROPONIN I (HIGH SENSITIVITY) - Abnormal; Notable for the following components:   Troponin I (High Sensitivity) 31 (*)    All other components within normal limits  CULTURE, BLOOD (ROUTINE X 2)  CULTURE, BLOOD (ROUTINE X 2)  LACTIC ACID, PLASMA  LACTIC ACID, PLASMA  FERRITIN  FIBRINOGEN  PROCALCITONIN  TRIGLYCERIDES  HIV ANTIBODY (ROUTINE TESTING W REFLEX)  CBC WITH DIFFERENTIAL/PLATELET  COMPREHENSIVE METABOLIC PANEL  C-REACTIVE PROTEIN  HEMOGLOBIN A1C  POC SARS CORONAVIRUS 2 AG -  ED  ABO/RH    EKG None  Radiology DG Chest Port 1 View  Result Date: 11/25/2019 CLINICAL DATA:  Shortness of breath. COVID positive last week. Hypoxia. EXAM: PORTABLE CHEST 1 VIEW COMPARISON:  Radiograph 05/10/2018 FINDINGS: Cardiomegaly. Unchanged mediastinal contours. Aortic atherosclerosis. Diffuse interstitial opacities with superimposed patchy consolidation in both mid lower lung zones. No significant pleural effusion. No pneumothorax. Stable osseous structures IMPRESSION: Diffuse interstitial opacities with superimposed patchy consolidation in both mid lower lung zones, consistent with multifocal pneumonia, pattern consistent with COVID-19. An element of underlying pulmonary edema is considered. Cardiomegaly. Electronically Signed   By: Keith Rake M.D.   On: 11/25/2019 19:17    Procedures .Critical Care Performed by: Lucrezia Starch, MD Authorized by: Lucrezia Starch, MD   Critical care provider statement:    Critical care time (minutes):  45   Critical care was necessary to treat or prevent imminent or life-threatening deterioration of the following conditions:  Respiratory failure   Critical care was time spent personally by me on the following activities:  Discussions with  consultants, evaluation of patient's response to treatment, examination of patient, ordering and performing treatments and interventions, ordering and review of laboratory studies, ordering and review of radiographic studies, pulse oximetry, re-evaluation of patient's condition, obtaining history from patient or surrogate and review of old charts   (including critical care time)  Medications Ordered in ED Medications  remdesivir 200 mg in sodium chloride 0.9% 250 mL IVPB (0 mg Intravenous Stopped 11/25/19 2323)    Followed by  remdesivir 100 mg in sodium chloride 0.9 % 100 mL IVPB (has no administration in time range)  enoxaparin (LOVENOX) injection 40 mg (has no administration in time range)  dexamethasone (DECADRON) tablet 6 mg (has no administration in time range)  insulin aspart (novoLOG) injection 0-9 Units (has no administration in time range)  Ipratropium-Albuterol (COMBIVENT) respimat 1 puff (has no administration in time range)  dexamethasone (DECADRON) injection 10 mg (10 mg Intravenous Given 11/25/19 1916)    ED Course  I have reviewed the triage vital signs and the nursing notes.  Pertinent labs & imaging results that were available during my care of the patient were reviewed by me and considered in my medical decision making (see chart for details).  Clinical Course as of Apr 23 0000  Thu Nov 25, 2019  2110 Discussed with hospitalist   [RD]  2110 Rechecked, remains comfortable on [facility]   [RD]    Clinical Course User Index [RD] Lucrezia Starch, MD   MDM Rules/Calculators/A&P                      64 year old lady presenting to ER with worsening shortness of breath in setting of known COVID-19. On exam patient noted to have tachypnea but not in respiratory distress. Did well on high flow nasal cannula. CXR consistent with Covid pneumonia. Started Decadron. Consult hospitalist, Dr. Flossie Buffy, who accepted patient. Per her request, placed consult to pharmacy for  remdesivir.   Final Clinical Impression(s) / ED Diagnoses Final diagnoses:  COVID-19  Acute respiratory failure with hypoxia [facility])    Rx / DC Orders ED Discharge Orders    None       Lucrezia Starch, MD 11/26/19 0000

## 2019-11-25 NOTE — H&P (Signed)
History and Physical    [employer] Heit [id number] DOB: 09-Apr-1956 DOA: 11/25/2019  PCP: [facility], [facility]  Patient coming from: Home  I have personally briefly reviewed patient's old medical records in [place]  Chief Complaint: worsening shortness of breath  HPI: Leah Lawson is a 64 y.o. female with medical history significant for COPD, Type 2 diabetes, HTN who presented with worsening shortness of breath.  She was diagnosed with COVID about a week ago outpatient and has been feeling fatigued with body aches for the past 2 weeks. This week began to notice worsening shortness of breath both at rest and worse with exertion. Also felt dizzy when she got up to a standing position for the past 2 days. Also had 2 days of vomiting. Has cough productive of sputum and sinus congestion. No fever. Unsure where she got COVID but felt bad after going to have a birthday party a few weeks ago.   Former tobacco use since she was 64yrs old but quit in 2019. Denies alcohol or illicit drug use.   ED Course: She was afebrile, normotensive but hypoxic down to 66% on room air requiring HFNC up to 15L.  WBC of 7.6. Na of 131, K of 3.4, glucose of 132, normal LFTs, CRP of 6.8. Procalcitonin of <0.10.   COVID PCR positive. CXR showed multifocal pna due to COVID.   Review of Systems: Constitutional: No Weight Change, No Fever ENT/Mouth: No sore throat, No Rhinorrhea Eyes: No Eye Pain, No Vision Changes Cardiovascular: No Chest Pain, + SOB, No PND, + Dyspnea on Exertion,  No Edema, No Palpitations Respiratory: + Cough, + Sputum, No Wheezing, + Dyspnea  Gastrointestinal: No Nausea, + Vomiting, No Diarrhea, No Constipation, No Pain Genitourinary: no Urinary Incontinenc Musculoskeletal: No Arthralgias, No Myalgias Skin: No Skin Lesions, No Pruritus, Neuro: + Weakness, No Numbness,  No Loss of Consciousness, No Syncope Psych: No Anxiety/Panic, No Depression, + decrease  appetite Heme/Lymph: No Bruising, No Bleeding Past Medical History:  Diagnosis Date  . Asthma   . COPD (chronic obstructive pulmonary disease) ([place])   . Diabetes mellitus without complication ([place])    "pre-diabetic"  . Hypertension     Past Surgical History:  Procedure Laterality Date  . BREAST SURGERY       reports that she has been smoking. She has been smoking about 0.50 packs per day. She has never used smokeless tobacco. She reports previous alcohol use. She reports that she does not use drugs.  Allergies  Allergen Reactions  . Mucinex [Guaifenesin Er] Diarrhea  . Singulair [Montelukast Sodium] Other (See Comments)    headaches      Prior to Admission medications   Medication Sig Start Date End Date Taking? Authorizing Provider  albuterol (PROVENTIL HFA;VENTOLIN HFA) 108 (90 Base) MCG/ACT inhaler Inhale 1 puff into the lungs every 6 (six) hours as needed for wheezing or shortness of breath.    [provider]  doxycycline (VIBRA-TABS) 100 MG tablet Take 1 tablet (100 mg total) by mouth every 12 (twelve) hours. 05/10/18   Thurnell Lose, MD  hydrochlorothiazide (HYDRODIURIL) 25 MG tablet Take 25 mg by mouth daily. 04/08/18   [provider]  ipratropium-albuterol (DUONEB) 0.5-2.5 (3) MG/3ML SOLN Use twice a day scheduled and every 4 hours as needed for shortness of breath and wheezing 05/10/18   Thurnell Lose, MD  Phenyleph-Doxylamine-DM-APAP (ALKA-SELTZER PLS NIGHT CLD/FLU PO) Take 2 tablets by mouth as needed (cold syptoms).    [provider]  predniSONE (DELTASONE) 5 MG tablet Label  & dispense according to the schedule below. Take 8 Pills PO for 3 days, 6 Pills PO for 3 days, 4 Pills PO for 3 days, 2 Pills PO for 3 days, 1 Pills PO for 3 days, 1/2 Pill  PO for 3 days then STOP. Total 65 pills. 05/10/18   Thurnell Lose, MD    Physical Exam: Vitals:   11/25/19 2100 11/25/19 2130 11/25/19 2230 11/25/19 2300  BP: 126/82 131/85 124/76  130/84  Pulse: 89 88 81 85  Resp: (!) 22 18 20 16   Temp:      TempSrc:      SpO2: 90% 96% 95% 96%  Weight:      Height:        Constitutional: NAD, calm, comfortable, nontoxic appearing morbidly obese female laying at 40 degree incline in bed Vitals:   11/25/19 2100 11/25/19 2130 11/25/19 2230 11/25/19 2300  BP: 126/82 131/85 124/76 130/84  Pulse: 89 88 81 85  Resp: (!) 22 18 20 16   Temp:      TempSrc:      SpO2: 90% 96% 95% 96%  Weight:      Height:       Eyes: PERRL, lids and conjunctivae normal ENMT: Mucous membranes are moist.  Neck: normal, supple, no masses, no thyromegaly Respiratory: clear to auscultation bilaterally, no wheezing, no crackles. Normal respiratory effort on 15 L high flow nasal cannula. No accessory muscle use.  Cardiovascular: Regular rate and rhythm, no murmurs / rubs / gallops. No extremity edema.   Abdomen: no tenderness, no masses palpated.  Bowel sounds positive.  Musculoskeletal: no clubbing / cyanosis. No joint deformity upper and lower extremities. Good ROM, no contractures. Normal muscle tone.  Skin: no rashes, lesions, ulcers. No induration Neurologic: CN 2-12 grossly intact. Sensation intact. Strength 5/5 in all 4.  Psychiatric: Normal judgment and insight. Alert and oriented x 3. Normal mood.     Labs on Admission: I have personally reviewed following labs and imaging studies  CBC: Recent Labs  Lab 11/25/19 1912  WBC 7.6  NEUTROABS 5.3  HGB 15.5*  HCT 49.2*  MCV 87.5  PLT 123456   Basic Metabolic Panel: Recent Labs  Lab 11/25/19 1912  NA 131*  K 3.4*  CL 93*  CO2 28  GLUCOSE 132*  BUN 14  CREATININE 0.73  CALCIUM 8.1*   GFR: Estimated Creatinine Clearance: 81.4 mL/min (by C-G formula based on SCr of 0.73 mg/dL). Liver Function Tests: Recent Labs  Lab 11/25/19 1912  AST 31  ALT 20  ALKPHOS 76  BILITOT 0.6  PROT 6.5  ALBUMIN 3.2*   No results for input(s): LIPASE, AMYLASE in the last 168 hours. No results for  input(s): AMMONIA in the last 168 hours. Coagulation Profile: No results for input(s): INR, PROTIME in the last 168 hours. Cardiac Enzymes: No results for input(s): CKTOTAL, CKMB, CKMBINDEX, TROPONINI in the last 168 hours. BNP (last 3 results) No results for input(s): PROBNP in the last 8760 hours. HbA1C: No results for input(s): HGBA1C in the last 72 hours. CBG: No results for input(s): GLUCAP in the last 168 hours. Lipid Profile: Recent Labs    11/25/19 1912  TRIG 108   Thyroid Function Tests: No results for input(s): TSH, T4TOTAL, FREET4, T3FREE, THYROIDAB in the last 72 hours. Anemia Panel: Recent Labs    11/25/19 1912  FERRITIN 48   Urine analysis: No results found for: COLORURINE, APPEARANCEUR, [place], [place], [place],  HGBUR, [place], UROBILINOGEN, NITRITE, LEUKOCYTESUR  Radiological Exams on Admission: DG Chest Port 1 View  Result Date: 11/25/2019 CLINICAL DATA:  Shortness of breath. COVID positive last week. Hypoxia. EXAM: PORTABLE CHEST 1 VIEW COMPARISON:  Radiograph 05/10/2018 FINDINGS: Cardiomegaly. Unchanged mediastinal contours. Aortic atherosclerosis. Diffuse interstitial opacities with superimposed patchy consolidation in both mid lower lung zones. No significant pleural effusion. No pneumothorax. Stable osseous structures IMPRESSION: Diffuse interstitial opacities with superimposed patchy consolidation in both mid lower lung zones, consistent with multifocal pneumonia, pattern consistent with COVID-19. An element of underlying pulmonary edema is considered. Cardiomegaly. Electronically Signed   By: Keith Rake M.D.   On: 11/25/2019 19:17    EKG: Independently reviewed.  Assessment/Plan  Acute hypoxic respiratory failure secondary to COVID pneumonia and likely mild COPD exacerbation  On 15 L HFNC  Maintain O2 > 92%  IV Decadron  Remdesivir Monitor inflammatory markers Scheduled q6hr Combivent inhaler  Type 2  diabetes Sensitive sliding scale  HTN  Continue antihypertensives  Morbid obesity BMI of 44  DVT prophylaxis:.Lovenox Code Status: Full Family Communication: Plan discussed with patient at bedside and with daughter Ubaldo Glassing over the phone disposition Plan: Home with at least 2 midnight stays  Consults called:  Admission status: inpatient  Status is: Inpatient  Remains inpatient appropriate because:IV treatments appropriate due to intensity of illness or inability to take PO   Dispo: The patient is from: Home              Anticipated d/c is to: Home              Anticipated d/c date is: > 3 days              Patient currently is not medically stable to d/c.          Orene Desanctis DO [facility]   If 7PM-7AM, please contact night-coverage [email or web address]   11/25/2019, 11:36 PM

## 2019-11-26 DIAGNOSIS — J9601 Acute respiratory failure with hypoxia: Secondary | ICD-10-CM

## 2019-11-26 LAB — CBC WITH DIFFERENTIAL/PLATELET
Abs Immature Granulocytes: 0.3 10*3/uL — ABNORMAL HIGH (ref 0.00–0.07)
Basophils Absolute: 0 10*3/uL (ref 0.0–0.1)
Basophils Relative: 0 %
Eosinophils Absolute: 0 10*3/uL (ref 0.0–0.5)
Eosinophils Relative: 0 %
HCT: 50.7 % — ABNORMAL HIGH (ref 36.0–46.0)
Hemoglobin: 16 g/dL — ABNORMAL HIGH (ref 12.0–15.0)
Lymphocytes Relative: 1 %
Lymphs Abs: 0.1 10*3/uL — ABNORMAL LOW (ref 0.7–4.0)
MCH: 27.9 pg (ref 26.0–34.0)
MCHC: 31.6 g/dL (ref 30.0–36.0)
MCV: 88.5 fL (ref 80.0–100.0)
Metamyelocytes Relative: 2 %
Monocytes Absolute: 0.3 10*3/uL (ref 0.1–1.0)
Monocytes Relative: 4 %
Myelocytes: 2 %
Neutro Abs: 7.5 10*3/uL (ref 1.7–7.7)
Neutrophils Relative %: 91 %
Platelets: 205 10*3/uL (ref 150–400)
RBC: 5.73 MIL/uL — ABNORMAL HIGH (ref 3.87–5.11)
RDW: 16.9 % — ABNORMAL HIGH (ref 11.5–15.5)
WBC: 8.2 10*3/uL (ref 4.0–10.5)
nRBC: 1.7 % — ABNORMAL HIGH (ref 0.0–0.2)
nRBC: 3 /100 WBC — ABNORMAL HIGH

## 2019-11-26 LAB — COMPREHENSIVE METABOLIC PANEL
ALT: 24 U/L (ref 0–44)
AST: 36 U/L (ref 15–41)
Albumin: 3.2 g/dL — ABNORMAL LOW (ref 3.5–5.0)
Alkaline Phosphatase: 75 U/L (ref 38–126)
Anion gap: 9 (ref 5–15)
BUN: 13 mg/dL (ref 8–23)
CO2: 30 mmol/L (ref 22–32)
Calcium: 8.3 mg/dL — ABNORMAL LOW (ref 8.9–10.3)
Chloride: 99 mmol/L (ref 98–111)
Creatinine, Ser: 0.75 mg/dL (ref 0.44–1.00)
GFR calc Af Amer: >60 mL/min (ref >60)
GFR calc non Af Amer: >60 mL/min (ref >60)
Glucose, Bld: 179 mg/dL — ABNORMAL HIGH (ref 70–99)
Potassium: 4.5 mmol/L (ref 3.5–5.1)
Sodium: 138 mmol/L (ref 135–145)
Total Bilirubin: 0.7 mg/dL (ref 0.3–1.2)
Total Protein: 6.5 g/dL (ref 6.5–8.1)

## 2019-11-26 LAB — HIV ANTIBODY (ROUTINE TESTING W REFLEX): HIV Screen 4th Generation wRfx: NONREACTIVE

## 2019-11-26 LAB — CBG MONITORING, ED: Glucose-Capillary: 150 mg/dL — ABNORMAL HIGH (ref 70–99)

## 2019-11-26 LAB — GLUCOSE, CAPILLARY
Glucose-Capillary: 130 mg/dL — ABNORMAL HIGH (ref 70–99)
Glucose-Capillary: 180 mg/dL — ABNORMAL HIGH (ref 70–99)
Glucose-Capillary: 171 mg/dL — ABNORMAL HIGH (ref 70–99)

## 2019-11-26 LAB — HEMOGLOBIN A1C
Hgb A1c MFr Bld: 7.2 % — ABNORMAL HIGH (ref 4.8–5.6)
Mean Plasma Glucose: 159.94 mg/dL

## 2019-11-26 LAB — D-DIMER, QUANTITATIVE: D-Dimer, Quant: 1.31 ug/mL-FEU — ABNORMAL HIGH (ref 0.00–0.50)

## 2019-11-26 LAB — C-REACTIVE PROTEIN: CRP: 7.9 mg/dL — ABNORMAL HIGH (ref <1.0)

## 2019-11-26 LAB — ABO/RH: ABO/RH(D): B POS

## 2019-11-26 MED ORDER — ALBUTEROL SULFATE HFA 108 (90 BASE) MCG/ACT IN AERS
2.0000 | INHALATION_SPRAY | RESPIRATORY_TRACT | Status: DC | PRN
  Filled 2019-11-26: qty 6.7

## 2019-11-26 MED ORDER — ENOXAPARIN SODIUM 60 MG/0.6ML ~~LOC~~ SOLN
50.0000 mg | Freq: Every day | SUBCUTANEOUS | Status: DC
  Administered 2019-11-27 – 2019-11-29 (×3): 50 mg via SUBCUTANEOUS
  Filled 2019-11-26 (×3): qty 0.6

## 2019-11-26 MED ORDER — FUROSEMIDE 10 MG/ML IJ SOLN
40.0000 mg | Freq: Once | INTRAMUSCULAR | Status: AC
  Administered 2019-11-26: 40 mg via INTRAVENOUS
  Filled 2019-11-26: qty 4

## 2019-11-26 MED ORDER — FAMOTIDINE 20 MG PO TABS
20.0000 mg | ORAL_TABLET | Freq: Every day | ORAL | Status: DC
  Administered 2019-11-26 – 2019-11-30 (×5): 20 mg via ORAL
  Filled 2019-11-26 (×5): qty 1

## 2019-11-26 MED ORDER — ONDANSETRON HCL 4 MG/2ML IJ SOLN: 4.0000 mg | Freq: Four times a day (QID) | INTRAMUSCULAR | Status: DC | PRN

## 2019-11-26 MED ORDER — BENZONATATE 100 MG PO CAPS
200.0000 mg | ORAL_CAPSULE | Freq: Three times a day (TID) | ORAL | Status: DC
  Administered 2019-11-26 – 2019-11-30 (×12): 200 mg via ORAL
  Filled 2019-11-26 (×12): qty 2

## 2019-11-26 MED ORDER — WHITE PETROLATUM EX OINT
TOPICAL_OINTMENT | CUTANEOUS | Status: AC
  Filled 2019-11-26: qty 28.35

## 2019-11-26 MED ORDER — HYDROCOD POLST-CPM POLST ER 10-8 MG/5ML PO SUER
5.0000 mL | Freq: Two times a day (BID) | ORAL | Status: DC | PRN
  Administered 2019-11-28: 5 mL via ORAL
  Filled 2019-11-26: qty 5

## 2019-11-26 MED ORDER — METHYLPREDNISOLONE SODIUM SUCC 125 MG IJ SOLR
50.0000 mg | Freq: Two times a day (BID) | INTRAMUSCULAR | Status: DC
  Administered 2019-11-26 – 2019-11-28 (×5): 50 mg via INTRAVENOUS
  Filled 2019-11-26 (×5): qty 2

## 2019-11-26 NOTE — Progress Notes (Signed)
PROGRESS NOTE                                                                                                                                                                                                             Patient Demographics:    Leah Lawson, is a 64 y.o. female, DOB - 1956/01/27, PO:[id number]  Outpatient Primary MD for the patient is [facility]   Admit date - 11/25/2019   LOS - 1  Chief Complaint  Patient presents with  . Covid Exposure  . Shortness of Breath       Brief Narrative: Patient is a 64 y.o. female with PMHx of COPD, DM-2, HTN-who was diagnosed with COVID-19 approximately 1 week prior to this hospital stay-presented with weakness, malaise, shortness of breath-found to have acute hypoxic respiratory failure secondary to COVID-19 pneumonia.  Significant Events: 4/22>> admit to [facility] requiring HFNC 15 L due to COVID-19 pneumonia 4/23>> improved-down to 4-5 L  COVID-19 medications: Steroids: 4/22>> Remdesivir: 4/22>>  Antibiotics: None  Microbiology data: 4/22: Blood culture>> negative  DVT prophylaxis: SQ Lovenox  Procedures: None  Consults: None   Subjective:    [employer] today feels better-when she came up to 5 L this morning she was on 12 L of HFNC-she was rapidly titrated down to around 4-5 L-O2 saturations remained in the low 90s.   Assessment  & Plan :   Acute Hypoxic Resp Failure due to Covid 19 Viral pneumonia: Appears to have improved with steroids/remdesivir-Per documentation-on 15 L of HFNC when she first presented to the ED-currently tolerating 4-5 L.  Does have some trace lower extremity edema-we will give 1 dose of IV Lasix.  Continue with steroids/remdesivir.  Since she is improving-hold off on Actemra.  Note: Rationale/risks/benefits of off label use of Actemra discussed in detail-she consents to the use of Actemra if her  hypoxemia worsens.  Fever: afebrile  O2 requirements:  SpO2: 95 % O2 Flow Rate (L/min): 15 L/min   COVID-19 Labs: Recent Labs    11/25/19 1912 11/26/19 0316 11/26/19 1134  DDIMER 0.66*  --  1.31*  FERRITIN 48  --   --   LDH 319*  --   --   CRP 6.8* 7.9*  --        Component Value Date/Time   BNP 194.8 (H) 11/25/2019  1912    Recent Labs  Lab 11/25/19 1912  PROCALCITON <0.10    Lab Results  Component Value Date   SARSCOV2NAA POSITIVE (A) 11/25/2019    Prone/Incentive Spirometry: encouraged patient to lie prone for 3-4 hours at a time for a total of 16 hours a day, and to encourage incentive spirometry use 3-4/hour.  COPD: Not in exacerbation-continue bronchodilators  HTN: BP stable-HCTZ on hold-s/p 1 dose of Lasix earlier this morning.  DM-2: CBG stable-continue SSI   Recent Labs    11/26/19 0758 11/26/19 1213  GLUCAP 150* 130*   Obesity: Estimated body mass index is 44.24 kg/m as calculated from the following:   Height as of this encounter: 5' 1.5" (1.562 m).   Weight as of this encounter: 108 kg.   ABG: No results found for: PHART, PCO2ART, PO2ART, HCO3, TCO2, ACIDBASEDEF, O2SAT  Vent Settings: N/A  Condition -  Guarded  Family Communication  : Left a voicemail for daughter Jonelle Sidle on 4/23  Code Status :  Full Code  Diet :  Diet Order            Diet heart healthy/carb modified Room service appropriate? Yes; Fluid consistency: Thin  Diet effective now               Disposition Plan  :   Status is: Inpatient  Remains inpatient appropriate because:Inpatient level of care appropriate due to severity of illness   Dispo: The patient is from: Home              Anticipated d/c is to: Home              Anticipated d/c date is: 3 days              Patient currently is not medically stable to d/c.   Barriers to discharge: Hypoxia requiring O2 supplementation/complete 5 days of IV Remdesivir  Antimicorbials  :    Anti-infectives (From  admission, onward)   Start     Dose/Rate Route Frequency Ordered Stop   11/26/19 1000  remdesivir 100 mg in sodium chloride 0.9 % 100 mL IVPB     100 mg 200 mL/hr over 30 Minutes Intravenous Daily 11/25/19 2113 11/30/19 0959   11/25/19 2115  remdesivir 200 mg in sodium chloride 0.9% 250 mL IVPB     200 mg 580 mL/hr over 30 Minutes Intravenous Once 11/25/19 2113 11/25/19 2323      Inpatient Medications  Scheduled Meds: . enoxaparin (LOVENOX) injection  40 mg Subcutaneous Daily  . insulin aspart  0-9 Units Subcutaneous TID WC  . Ipratropium-Albuterol  1 puff Inhalation Q6H  . methylPREDNISolone (SOLU-MEDROL) injection  50 mg Intravenous Q12H   Continuous Infusions: . remdesivir 100 mg in NS 100 mL 100 mg (11/26/19 1053)   PRN Meds:.   Time Spent in minutes  35    See all Orders from today for further details   Oren Binet M.D on 11/26/2019 at 3:52 PM  To page go to [email or web address] - use universal password  [facility] -  Office  [phone]    Objective:   Vitals:   11/26/19 0700 11/26/19 0800 11/26/19 0923 11/26/19 1200  BP: 127/87 (!) 132/93 111/74 113/77  Pulse: 83 84 77 78  Resp: 14 18 16 20   Temp:   98 F (36.7 C) 98 F (36.7 C)  TempSrc:   Oral Oral  SpO2: 96% 93% 94% 95%  Weight:      Height:  Wt Readings from Last 3 Encounters:  11/25/19 108 kg  05/09/18 100.9 kg     Intake/Output Summary (Last 24 hours) at 11/26/2019 1552 Last data filed at 11/26/2019 1300 Gross per 24 hour  Intake 370 ml  Output 1175 ml  Net -805 ml     Physical Exam Gen Exam:Alert awake-not in any distress HEENT:atraumatic, normocephalic Chest: B/L clear to auscultation anteriorly CVS:S1S2 regular Abdomen:soft non tender, non distended Extremities:+ edema Neurology: Non focal Skin: no rash   Data Review:    CBC Recent Labs  Lab 11/25/19 1912 11/26/19 0316  WBC 7.6 8.2  HGB 15.5* 16.0*  HCT 49.2* 50.7*  PLT 211 205  MCV 87.5 88.5  MCH 27.6  27.9  MCHC 31.5 31.6  RDW 16.3* 16.9*  LYMPHSABS 1.1 0.1*  MONOABS 0.5 0.3  EOSABS 0.0 0.0  BASOSABS 0.1 0.0    Chemistries  Recent Labs  Lab 11/25/19 1912 11/26/19 0316  NA 131* 138  K 3.4* 4.5  CL 93* 99  CO2 28 30  GLUCOSE 132* 179*  BUN 14 13  CREATININE 0.73 0.75  CALCIUM 8.1* 8.3*  AST 31 36  ALT 20 24  ALKPHOS 76 75  BILITOT 0.6 0.7   ------------------------------------------------------------------------------------------------------------------ Recent Labs    11/25/19 1912  TRIG 108    Lab Results  Component Value Date   HGBA1C 7.2 (H) 11/26/2019   ------------------------------------------------------------------------------------------------------------------ No results for input(s): TSH, T4TOTAL, T3FREE, THYROIDAB in the last 72 hours.  Invalid input(s): FREET3 ------------------------------------------------------------------------------------------------------------------ Recent Labs    11/25/19 1912  FERRITIN 48    Coagulation profile No results for input(s): INR, PROTIME in the last 168 hours.  Recent Labs    11/25/19 1912 11/26/19 1134  DDIMER 0.66* 1.31*    Cardiac Enzymes No results for input(s): CKMB, TROPONINI, MYOGLOBIN in the last 168 hours.  Invalid input(s): CK ------------------------------------------------------------------------------------------------------------------    Component Value Date/Time   BNP 194.8 (H) 11/25/2019 1912    Micro Results Recent Results (from the past 240 hour(s))  Blood Culture (routine x 2)     Status: None (Preliminary result)   Collection Time: 11/25/19  7:11 PM   Specimen: BLOOD  Result Value Ref Range Status   Specimen Description BLOOD RIGHT ANTECUBITAL  Final   Special Requests   Final    BOTTLES DRAWN AEROBIC AND ANAEROBIC Blood Culture results may not be optimal due to an excessive volume of blood received in culture bottles   Culture   Final    NO GROWTH < 24  HOURS Performed at [facility], [place] [address], [place], [place] [address]    Report Status PENDING  Incomplete  Blood Culture (routine x 2)     Status: None (Preliminary result)   Collection Time: 11/25/19  7:12 PM   Specimen: BLOOD RIGHT FOREARM  Result Value Ref Range Status   Specimen Description BLOOD RIGHT FOREARM  Final   Special Requests   Final    BOTTLES DRAWN AEROBIC AND ANAEROBIC Blood Culture results may not be optimal due to an excessive volume of blood received in culture bottles   Culture   Final    NO GROWTH < 24 HOURS Performed at [facility], [place] [address] [place], [place] [address]    Report Status PENDING  Incomplete  Respiratory Panel by RT PCR (Flu A&B, Covid) - Nasopharyngeal Swab     Status: Abnormal   Collection Time: 11/25/19  7:33 PM   Specimen: Nasopharyngeal Swab  Result Value  Ref Range Status   SARS Coronavirus 2 by RT PCR POSITIVE (A) NEGATIVE Final    Comment: RESULT CALLED TO, READ BACK BY AND VERIFIED WITH: Teofilo Pod RN 11/25/19 2132 JDW (NOTE) SARS-CoV-2 target nucleic acids are DETECTED. SARS-CoV-2 RNA is generally detectable in upper respiratory specimens  during the acute phase of infection. Positive results are indicative of the presence of the identified virus, but do not rule out bacterial infection or co-infection with other pathogens not detected by the test. Clinical correlation with patient history and other diagnostic information is necessary to determine patient infection status. The expected result is Negative. Fact Sheet for Patients:  PinkCheek.be Fact Sheet for Healthcare Providers: GravelBags.it This test is not yet approved or cleared by the [place] FDA and  has been authorized for detection and/or diagnosis of SARS-CoV-2 by FDA under an Emergency Use Authorization (EUA).  This EUA will remain in effect (meaning this test can be used) for the   duration of  the COVID-19 declaration under Section 564(b)(1) of the Act, 21 U.S.C. section 360bbb-3(b)(1), unless the authorization is terminated or revoked sooner.    Influenza A by PCR NEGATIVE NEGATIVE Final   Influenza B by PCR NEGATIVE NEGATIVE Final    Comment: (NOTE) The Xpert Xpress SARS-CoV-2/FLU/RSV assay is intended as an aid in  the diagnosis of influenza from Nasopharyngeal swab specimens and  should not be used as a sole basis for treatment. Nasal washings and  aspirates are unacceptable for Xpert Xpress SARS-CoV-2/FLU/RSV  testing. Fact Sheet for Patients: PinkCheek.be Fact Sheet for Healthcare Providers: GravelBags.it This test is not yet approved or cleared by the [place] FDA and  has been authorized for detection and/or diagnosis of SARS-CoV-2 by  FDA under an Emergency Use Authorization (EUA). This EUA will remain  in effect (meaning this test can be used) for the duration of the  Covid-19 declaration under Section 564(b)(1) of the Act, 21  U.S.C. section 360bbb-3(b)(1), unless the authorization is  terminated or revoked. Performed at [facility], [place] [address] [place], [place] [address]     Radiology Reports DG Chest [place] 1 View  Result Date: 11/25/2019 CLINICAL DATA:  Shortness of breath. COVID positive last week. Hypoxia. EXAM: PORTABLE CHEST 1 VIEW COMPARISON:  Radiograph 05/10/2018 FINDINGS: Cardiomegaly. Unchanged mediastinal contours. Aortic atherosclerosis. Diffuse interstitial opacities with superimposed patchy consolidation in both mid lower lung zones. No significant pleural effusion. No pneumothorax. Stable osseous structures IMPRESSION: Diffuse interstitial opacities with superimposed patchy consolidation in both mid lower lung zones, consistent with multifocal pneumonia, pattern consistent with COVID-19. An element of underlying pulmonary edema is considered. Cardiomegaly.  Electronically Signed   By: Keith Rake M.D.   On: 11/25/2019 19:17

## 2019-11-27 LAB — COMPREHENSIVE METABOLIC PANEL
ALT: 20 U/L (ref 0–44)
AST: 22 U/L (ref 15–41)
Albumin: 2.7 g/dL — ABNORMAL LOW (ref 3.5–5.0)
Alkaline Phosphatase: 61 U/L (ref 38–126)
Anion gap: 9 (ref 5–15)
BUN: 40 mg/dL — ABNORMAL HIGH (ref 8–23)
CO2: 29 mmol/L (ref 22–32)
Calcium: 8.3 mg/dL — ABNORMAL LOW (ref 8.9–10.3)
Chloride: 100 mmol/L (ref 98–111)
Creatinine, Ser: 0.74 mg/dL (ref 0.44–1.00)
GFR calc Af Amer: >60 mL/min (ref >60)
GFR calc non Af Amer: >60 mL/min (ref >60)
Glucose, Bld: 182 mg/dL — ABNORMAL HIGH (ref 70–99)
Potassium: 4.7 mmol/L (ref 3.5–5.1)
Sodium: 138 mmol/L (ref 135–145)
Total Bilirubin: 0.6 mg/dL (ref 0.3–1.2)
Total Protein: 5.9 g/dL — ABNORMAL LOW (ref 6.5–8.1)

## 2019-11-27 LAB — GLUCOSE, CAPILLARY
Glucose-Capillary: 168 mg/dL — ABNORMAL HIGH (ref 70–99)
Glucose-Capillary: 183 mg/dL — ABNORMAL HIGH (ref 70–99)
Glucose-Capillary: 187 mg/dL — ABNORMAL HIGH (ref 70–99)
Glucose-Capillary: 186 mg/dL — ABNORMAL HIGH (ref 70–99)

## 2019-11-27 LAB — CBC WITH DIFFERENTIAL/PLATELET
Abs Immature Granulocytes: 0.4 10*3/uL — ABNORMAL HIGH (ref 0.00–0.07)
Basophils Absolute: 0 10*3/uL (ref 0.0–0.1)
Basophils Relative: 0 %
Eosinophils Absolute: 0 10*3/uL (ref 0.0–0.5)
Eosinophils Relative: 0 %
HCT: 44 % (ref 36.0–46.0)
Hemoglobin: 13.7 g/dL (ref 12.0–15.0)
Immature Granulocytes: 5 %
Lymphocytes Relative: 11 %
Lymphs Abs: 0.9 10*3/uL (ref 0.7–4.0)
MCH: 27.6 pg (ref 26.0–34.0)
MCHC: 31.1 g/dL (ref 30.0–36.0)
MCV: 88.5 fL (ref 80.0–100.0)
Monocytes Absolute: 0.4 10*3/uL (ref 0.1–1.0)
Monocytes Relative: 4 %
Neutro Abs: 6.9 10*3/uL (ref 1.7–7.7)
Neutrophils Relative %: 80 %
Platelets: 218 10*3/uL (ref 150–400)
RBC: 4.97 MIL/uL (ref 3.87–5.11)
RDW: 16.1 % — ABNORMAL HIGH (ref 11.5–15.5)
WBC: 8.6 10*3/uL (ref 4.0–10.5)
nRBC: 1.3 % — ABNORMAL HIGH (ref 0.0–0.2)

## 2019-11-27 LAB — BRAIN NATRIURETIC PEPTIDE: B Natriuretic Peptide: 96.5 pg/mL (ref 0.0–100.0)

## 2019-11-27 LAB — D-DIMER, QUANTITATIVE: D-Dimer, Quant: 0.71 ug/mL-FEU — ABNORMAL HIGH (ref 0.00–0.50)

## 2019-11-27 LAB — C-REACTIVE PROTEIN: CRP: 4 mg/dL — ABNORMAL HIGH (ref <1.0)

## 2019-11-27 LAB — PROCALCITONIN: Procalcitonin: <0.1 ng/mL

## 2019-11-27 LAB — MAGNESIUM: Magnesium: 2.2 mg/dL (ref 1.7–2.4)

## 2019-11-27 MED ORDER — IPRATROPIUM-ALBUTEROL 20-100 MCG/ACT IN AERS
1.0000 | INHALATION_SPRAY | Freq: Three times a day (TID) | RESPIRATORY_TRACT | Status: DC
  Administered 2019-11-27 – 2019-11-30 (×10): 1 via RESPIRATORY_TRACT
  Filled 2019-11-27: qty 4

## 2019-11-27 NOTE — Evaluation (Signed)
Physical Therapy Evaluation Patient Details Name: Leah Lawson MRN: [id number] DOB: 03/25/1956 Today's Date: 11/27/2019   History of Present Illness  64 y.o. female admitted on 11/25/19 for SOB dx with acute hypoxic respiratory failure due to COVID 19 PNA.  Pt with significant PMH of HTN, DM, COPD, asthma.    Clinical Impression  Pt was able to walk a good distance down the hallway with min guard assist and occasional use of hallway rail for mild gait instability.  Pt's O2 sats dropped to 86% on 4 L O2 [place] during gait and needed 2 standing rest breaks for PLB.  Pt reports some STM difficulty during our session (very self aware).  She would benefit from continued acute therapy.   PT to follow acutely for deficits listed below.      Follow Up Recommendations No PT follow up    Equipment Recommendations  None recommended by PT    Recommendations for Other Services   NA    Precautions / Restrictions Precautions Precautions: Other (comment) Precaution Comments: monitor O2      Mobility  Bed Mobility               General bed mobility comments: Pt was OOB in the chair.   Transfers Overall transfer level: Needs assistance Equipment used: None Transfers: Sit to/from Stand Sit to Stand: Supervision         General transfer comment: supervision for safety  Ambulation/Gait Ambulation/Gait assistance: Min guard Gait Distance (Feet): 150 Feet Assistive device: None(occasional use of the hallway rail for stability) Gait Pattern/deviations: Step-through pattern;Staggering right;Staggering left Gait velocity: decreased Gait velocity interpretation: 1.31 - 2.62 ft/sec, indicative of limited community ambulator General Gait Details: Pt with slow, mildly staggering gait pattern, used 4 L O2 [place] during gait with O2 sats as low as 86% recovering <3 mins with seated rest break.          Balance Overall balance assessment: Needs assistance Sitting-balance support: Feet  supported;No upper extremity supported Sitting balance-Leahy Scale: Good     Standing balance support: Single extremity supported Standing balance-Leahy Scale: Fair                               Pertinent Vitals/Pain Pain Assessment: No/denies pain    Home Living Family/patient expects to be discharged to:: Private residence   Available Help at Discharge: Family Type of Home: House         Home Equipment: None      Prior Function Level of Independence: Independent         Comments: Drives, did work until laid off due to [employer] (worked at [employer]) has her Brewing technologist in business     [place]: Right    Extremity/Trunk Assessment   Upper Extremity Assessment Upper Extremity Assessment: Generalized weakness    Lower Extremity Assessment Lower Extremity Assessment: Generalized weakness    Cervical / Trunk Assessment Cervical / Trunk Assessment: Normal  Communication   Communication: No difficulties  Cognition Arousal/Alertness: Awake/alert Behavior During Therapy: WFL for tasks assessed/performed Overall Cognitive Status: Impaired/Different from baseline Area of Impairment: Memory                     Memory: Decreased short-term memory         General Comments: Pt admittingly having some STM deficits, we discussed the idea of "COVID fog"  Exercises Other Exercises Other Exercises: flutter valve x 10 Other Exercises: Incentive spirometer x 10 each 750 mL max inspired volume.   Assessment/Plan    PT Assessment Patient needs continued PT services  PT Problem List Decreased strength;Decreased activity tolerance;Decreased balance;Decreased mobility;Decreased knowledge of use of DME;Decreased knowledge of precautions;Cardiopulmonary status limiting activity       PT Treatment Interventions DME instruction;Gait training;Stair training;Functional mobility training;Therapeutic activities;Therapeutic  exercise;Balance training;Patient/family education    PT Goals (Current goals can be found in the Care Plan section)  Acute Rehab PT Goals Patient Stated Goal: to go home when she is better PT Goal Formulation: With patient Time For Goal Achievement: 12/11/19 Potential to Achieve Goals: Good    Frequency Min 3X/week           AM-PAC PT "6 Clicks" Mobility  Outcome Measure Help needed turning from your back to your side while in a flat bed without using bedrails?: None Help needed moving from lying on your back to sitting on the side of a flat bed without using bedrails?: None Help needed moving to and from a bed to a chair (including a wheelchair)?: A Little Help needed standing up from a chair using your arms (e.g., wheelchair or bedside chair)?: None Help needed to walk in hospital room?: A Little Help needed climbing 3-5 steps with a railing? : A Little 6 Click Score: 21    End of Session Equipment Utilized During Treatment: Oxygen Activity Tolerance: Patient limited by fatigue Patient left: in chair;with call bell/phone within reach   PT Visit Diagnosis: Muscle weakness (generalized) (M62.81);Difficulty in walking, not elsewhere classified (R26.2)    Time: [id number] PT Time Calculation (min) (ACUTE ONLY): 25 min   Charges:          Verdene Lennert, PT, DPT  Acute Rehabilitation [phone] pager #[phone] office     PT Evaluation $PT Eval Moderate Complexity: 1 Mod PT Treatments $Gait Training: 8-22 mins        11/27/2019, 3:06 PM

## 2019-11-27 NOTE — Progress Notes (Signed)
PROGRESS NOTE                                                                                                                                                                                                             Patient Demographics:    Leah Lawson, is a 64 y.o. female, DOB - 02-02-1956, [id number]  Outpatient Primary MD for the patient is [facility]    LOS - 2  Admit date - 11/25/2019    Chief Complaint  Patient presents with  . Covid Exposure  . Shortness of Breath       Brief Narrative - Leah Lawson is a 64 y.o. female with medical history significant for COPD, Type 2 diabetes, HTN who presented with worsening shortness of breath, he was diagnosed with acute hypoxic respiratory failure due to COVID-19 pneumonia and admitted.   Subjective:    [employer] today has, No headache, No chest pain, No abdominal pain - No Nausea, No new weakness tingling or numbness, improved cough and shortness of breath.   Assessment  & Plan :      1. Acute Hypoxic Resp. Failure due to Acute Covid 19 Viral Pneumonitis during the ongoing 2020 Covid 19 Pandemic - she has moderate disease and has been treated with IV steroids and remdesivir with good effect, she seems to be stabilizing, will continue to monitor closely.  CRP certainly coming down and so are her oxygen requirements.  She has previously consented to Actemra use in case it is needed.  Encouraged the patient to sit up in chair in the daytime use I-S and flutter valve for pulmonary toiletry and then prone in bed when at night.  Will advance activity and titrate down oxygen as possible.   SpO2: 93 % O2 Flow Rate (L/min): 4.5 L/min  Recent Labs  Lab 11/25/19 1912 11/25/19 1933 11/26/19 0316 11/26/19 1134 11/27/19 0316  CRP 6.8*  --  7.9*  --  4.0*  DDIMER 0.66*  --   --  1.31* 0.71*  BNP 194.8*  --   --   --  96.5  PROCALCITON  <0.10  --   --   --  <0.10  SARSCOV2NAA  --  POSITIVE*  --   --   --     Hepatic Function Latest Ref Rng & Units 11/27/2019 11/26/2019  11/25/2019  Total Protein 6.5 - 8.1 g/dL 5.9(L) 6.5 6.5  Albumin 3.5 - 5.0 g/dL 2.7(L) 3.2(L) 3.2(L)  AST 15 - 41 U/L 22 36 31  ALT 0 - 44 U/L '20 24 20  '$ Alk Phosphatase 38 - 126 U/L 61 75 76  Total Bilirubin 0.3 - 1.2 mg/dL 0.6 0.7 0.6     2.  Hypertension.  Currently blood pressure stable off of medications.  3.  Morbid obesity.  BMI of 44.  Follow with PCP for weight loss.  4.  DM type II.  For now sliding scale and monitor.  Lab Results  Component Value Date   HGBA1C 7.2 (H) 11/26/2019   CBG (last 3)  Recent Labs    11/26/19 2027 11/27/19 0749 11/27/19 1211  GLUCAP 171* 168* 183*       Condition - Extremely Guarded  Family Communication  :  Tiffany [phone] on 11/27/19 at 2:10 PM, message left.  Code Status :  Full  Diet :   Diet Order            Diet heart healthy/carb modified Room service appropriate? Yes; Fluid consistency: Thin  Diet effective now               Disposition Plan  : Stay inpatient in the hospital to complete IV remdesivir course for COVID-19 pneumonia causing acute hypoxic respiratory failure.  Consults  : None  Procedures  : None  PUD Prophylaxis : None  DVT Prophylaxis  :  Lovenox   Lab Results  Component Value Date   PLT 218 11/27/2019    Inpatient Medications  Scheduled Meds: . benzonatate  200 mg Oral TID  . enoxaparin (LOVENOX) injection  50 mg Subcutaneous Daily  . famotidine  20 mg Oral Daily  . insulin aspart  0-9 Units Subcutaneous TID WC  . Ipratropium-Albuterol  1 puff Inhalation TID  . methylPREDNISolone (SOLU-MEDROL) injection  50 mg Intravenous Q12H   Continuous Infusions: . remdesivir 100 mg in NS 100 mL 100 mg (11/27/19 0951)   PRN Meds:.albuterol, chlorpheniramine-HYDROcodone, ondansetron (ZOFRAN) IV  Antibiotics  :    Anti-infectives (From admission, onward)    Start     Dose/Rate Route Frequency Ordered Stop   11/26/19 1000  remdesivir 100 mg in sodium chloride 0.9 % 100 mL IVPB     100 mg 200 mL/hr over 30 Minutes Intravenous Daily 11/25/19 2113 11/30/19 0959   11/25/19 2115  remdesivir 200 mg in sodium chloride 0.9% 250 mL IVPB     200 mg 580 mL/hr over 30 Minutes Intravenous Once 11/25/19 2113 11/25/19 2323       Time Spent in minutes  30   [name] M.D on 11/27/2019 at 2:09 PM  To page go to [email or web address] - password [facility]  [facility] -  Office  [phone]    See all Orders from today for further details    Objective:   Vitals:   11/27/19 0023 11/27/19 0327 11/27/19 0445 11/27/19 0838  BP: 107/78  99/67 103/71  Pulse: 83  63 67  Resp: '18  17 16  '$ Temp: 98.2 F (36.8 C)  98.4 F (36.9 C) 97.9 F (36.6 C)  TempSrc: Oral  Oral Oral  SpO2: 92%  95% 93%  Weight:  106.5 kg    Height:        Wt Readings from Last 3 Encounters:  11/27/19 106.5 kg  05/09/18 100.9 kg     Intake/Output Summary (Last 24 hours) at 11/27/2019 1409  Last data filed at 11/27/2019 0500 Gross per 24 hour  Intake 340 ml  Output 1750 ml  Net -1410 ml     Physical Exam  Awake Alert, No new F.N deficits, Normal affect [place].AT,PERRAL Supple Neck,No JVD, No cervical lymphadenopathy appriciated.  Symmetrical Chest wall movement, Good air movement bilaterally, CTAB RRR,No Gallops,Rubs or new Murmurs, No Parasternal Heave +ve B.Sounds, Abd Soft, No tenderness, No organomegaly appriciated, No rebound - guarding or rigidity. No Cyanosis, Clubbing or edema, No new Rash or bruise      Data Review:    CBC Recent Labs  Lab 11/25/19 1912 11/26/19 0316 11/27/19 0316  WBC 7.6 8.2 8.6  HGB 15.5* 16.0* 13.7  HCT 49.2* 50.7* 44.0  PLT 211 205 218  MCV 87.5 88.5 88.5  MCH 27.6 27.9 27.6  MCHC 31.5 31.6 31.1  RDW 16.3* 16.9* 16.1*  LYMPHSABS 1.1 0.1* 0.9  MONOABS 0.5 0.3 0.4  EOSABS 0.0 0.0 0.0  BASOSABS 0.1 0.0 0.0    Chemistries   Recent Labs  Lab 11/25/19 1912 11/26/19 0029 11/26/19 0316 11/27/19 0316  NA 131*  --  138 138  K 3.4*  --  4.5 4.7  CL 93*  --  99 100  CO2 28  --  30 29  GLUCOSE 132*  --  179* 182*  BUN 14  --  13 40*  CREATININE 0.73  --  0.75 0.74  CALCIUM 8.1*  --  8.3* 8.3*  AST 31  --  36 22  ALT 20  --  24 20  ALKPHOS 76  --  75 61  BILITOT 0.6  --  0.7 0.6  MG  --   --   --  2.2  HGBA1C  --  7.2*  --   --      ------------------------------------------------------------------------------------------------------------------ Recent Labs    11/25/19 1912  TRIG 108    Lab Results  Component Value Date   HGBA1C 7.2 (H) 11/26/2019   ------------------------------------------------------------------------------------------------------------------ No results for input(s): TSH, T4TOTAL, T3FREE, THYROIDAB in the last 72 hours.  Invalid input(s): FREET3  Cardiac Enzymes No results for input(s): CKMB, TROPONINI, MYOGLOBIN in the last 168 hours.  Invalid input(s): CK ------------------------------------------------------------------------------------------------------------------    Component Value Date/Time   BNP 96.5 11/27/2019 0316    Micro Results Recent Results (from the past 240 hour(s))  Blood Culture (routine x 2)     Status: None (Preliminary result)   Collection Time: 11/25/19  7:11 PM   Specimen: BLOOD  Result Value Ref Range Status   Specimen Description BLOOD RIGHT ANTECUBITAL  Final   Special Requests   Final    BOTTLES DRAWN AEROBIC AND ANAEROBIC Blood Culture results may not be optimal due to an excessive volume of blood received in culture bottles   Culture   Final    NO GROWTH 2 DAYS Performed at [facility], [place] [address], [place], [place] [address]    Report Status PENDING  Incomplete  Blood Culture (routine x 2)     Status: None (Preliminary result)   Collection Time: 11/25/19  7:12 PM   Specimen: BLOOD RIGHT FOREARM  Result Value Ref Range  Status   Specimen Description BLOOD RIGHT FOREARM  Final   Special Requests   Final    BOTTLES DRAWN AEROBIC AND ANAEROBIC Blood Culture results may not be optimal due to an excessive volume of blood received in culture bottles   Culture   Final    NO GROWTH 2 DAYS Performed  at [facility], [place] [address], [place], [place] [address]    Report Status PENDING  Incomplete  Respiratory Panel by RT PCR (Flu A&B, Covid) - Nasopharyngeal Swab     Status: Abnormal   Collection Time: 11/25/19  7:33 PM   Specimen: Nasopharyngeal Swab  Result Value Ref Range Status   SARS Coronavirus 2 by RT PCR POSITIVE (A) NEGATIVE Final    Comment: RESULT CALLED TO, READ BACK BY AND VERIFIED WITH: Teofilo Pod RN 11/25/19 2132 JDW (NOTE) SARS-CoV-2 target nucleic acids are DETECTED. SARS-CoV-2 RNA is generally detectable in upper respiratory specimens  during the acute phase of infection. Positive results are indicative of the presence of the identified virus, but do not rule out bacterial infection or co-infection with other pathogens not detected by the test. Clinical correlation with patient history and other diagnostic information is necessary to determine patient infection status. The expected result is Negative. Fact Sheet for Patients:  PinkCheek.be Fact Sheet for Healthcare Providers: GravelBags.it This test is not yet approved or cleared by the [place] FDA and  has been authorized for detection and/or diagnosis of SARS-CoV-2 by FDA under an Emergency Use Authorization (EUA).  This EUA will remain in effect (meaning this test can be used) for the  duration of  the COVID-19 declaration under Section 564(b)(1) of the Act, 21 U.S.C. section 360bbb-3(b)(1), unless the authorization is terminated or revoked sooner.    Influenza A by PCR NEGATIVE NEGATIVE Final   Influenza B by PCR NEGATIVE NEGATIVE Final    Comment: (NOTE) The Xpert  Xpress SARS-CoV-2/FLU/RSV assay is intended as an aid in  the diagnosis of influenza from Nasopharyngeal swab specimens and  should not be used as a sole basis for treatment. Nasal washings and  aspirates are unacceptable for Xpert Xpress SARS-CoV-2/FLU/RSV  testing. Fact Sheet for Patients: PinkCheek.be Fact Sheet for Healthcare Providers: GravelBags.it This test is not yet approved or cleared by the [place] FDA and  has been authorized for detection and/or diagnosis of SARS-CoV-2 by  FDA under an Emergency Use Authorization (EUA). This EUA will remain  in effect (meaning this test can be used) for the duration of the  Covid-19 declaration under Section 564(b)(1) of the Act, 21  U.S.C. section 360bbb-3(b)(1), unless the authorization is  terminated or revoked. Performed at [facility], [place] [address], [place], [place] [address]     Radiology Reports DG Chest [place] 1 View  Result Date: 11/25/2019 CLINICAL DATA:  Shortness of breath. COVID positive last week. Hypoxia. EXAM: PORTABLE CHEST 1 VIEW COMPARISON:  Radiograph 05/10/2018 FINDINGS: Cardiomegaly. Unchanged mediastinal contours. Aortic atherosclerosis. Diffuse interstitial opacities with superimposed patchy consolidation in both mid lower lung zones. No significant pleural effusion. No pneumothorax. Stable osseous structures IMPRESSION: Diffuse interstitial opacities with superimposed patchy consolidation in both mid lower lung zones, consistent with multifocal pneumonia, pattern consistent with COVID-19. An element of underlying pulmonary edema is considered. Cardiomegaly. Electronically Signed   By: Keith Rake M.D.   On: 11/25/2019 19:17

## 2019-11-28 LAB — COMPREHENSIVE METABOLIC PANEL
ALT: 16 U/L (ref 0–44)
AST: 19 U/L (ref 15–41)
Albumin: 3 g/dL — ABNORMAL LOW (ref 3.5–5.0)
Alkaline Phosphatase: 66 U/L (ref 38–126)
Anion gap: 8 (ref 5–15)
BUN: 27 mg/dL — ABNORMAL HIGH (ref 8–23)
CO2: 31 mmol/L (ref 22–32)
Calcium: 8.7 mg/dL — ABNORMAL LOW (ref 8.9–10.3)
Chloride: 103 mmol/L (ref 98–111)
Creatinine, Ser: 0.71 mg/dL (ref 0.44–1.00)
GFR calc Af Amer: >60 mL/min (ref >60)
GFR calc non Af Amer: >60 mL/min (ref >60)
Glucose, Bld: 174 mg/dL — ABNORMAL HIGH (ref 70–99)
Potassium: 5 mmol/L (ref 3.5–5.1)
Sodium: 142 mmol/L (ref 135–145)
Total Bilirubin: 0.7 mg/dL (ref 0.3–1.2)
Total Protein: 6.3 g/dL — ABNORMAL LOW (ref 6.5–8.1)

## 2019-11-28 LAB — GLUCOSE, CAPILLARY
Glucose-Capillary: 174 mg/dL — ABNORMAL HIGH (ref 70–99)
Glucose-Capillary: 227 mg/dL — ABNORMAL HIGH (ref 70–99)
Glucose-Capillary: 256 mg/dL — ABNORMAL HIGH (ref 70–99)
Glucose-Capillary: 168 mg/dL — ABNORMAL HIGH (ref 70–99)

## 2019-11-28 LAB — CBC WITH DIFFERENTIAL/PLATELET
Abs Immature Granulocytes: 0.15 10*3/uL — ABNORMAL HIGH (ref 0.00–0.07)
Basophils Absolute: 0 10*3/uL (ref 0.0–0.1)
Basophils Relative: 0 %
Eosinophils Absolute: 0 10*3/uL (ref 0.0–0.5)
Eosinophils Relative: 0 %
HCT: 48.2 % — ABNORMAL HIGH (ref 36.0–46.0)
Hemoglobin: 14.8 g/dL (ref 12.0–15.0)
Immature Granulocytes: 2 %
Lymphocytes Relative: 11 %
Lymphs Abs: 1.1 10*3/uL (ref 0.7–4.0)
MCH: 27.5 pg (ref 26.0–34.0)
MCHC: 30.7 g/dL (ref 30.0–36.0)
MCV: 89.4 fL (ref 80.0–100.0)
Monocytes Absolute: 0.5 10*3/uL (ref 0.1–1.0)
Monocytes Relative: 5 %
Neutro Abs: 8.4 10*3/uL — ABNORMAL HIGH (ref 1.7–7.7)
Neutrophils Relative %: 82 %
Platelets: 297 10*3/uL (ref 150–400)
RBC: 5.39 MIL/uL — ABNORMAL HIGH (ref 3.87–5.11)
RDW: 16.8 % — ABNORMAL HIGH (ref 11.5–15.5)
WBC: 10.1 10*3/uL (ref 4.0–10.5)
nRBC: 0.7 % — ABNORMAL HIGH (ref 0.0–0.2)

## 2019-11-28 LAB — MAGNESIUM: Magnesium: 2.4 mg/dL (ref 1.7–2.4)

## 2019-11-28 LAB — D-DIMER, QUANTITATIVE: D-Dimer, Quant: 0.52 ug/mL-FEU — ABNORMAL HIGH (ref 0.00–0.50)

## 2019-11-28 LAB — PROCALCITONIN: Procalcitonin: <0.1 ng/mL

## 2019-11-28 LAB — MRSA PCR SCREENING: MRSA by PCR: NEGATIVE

## 2019-11-28 LAB — C-REACTIVE PROTEIN: CRP: 0.8 mg/dL (ref <1.0)

## 2019-11-28 LAB — BRAIN NATRIURETIC PEPTIDE: B Natriuretic Peptide: 163.2 pg/mL — ABNORMAL HIGH (ref 0.0–100.0)

## 2019-11-28 MED ORDER — SODIUM CHLORIDE 0.9 % IV SOLN: INTRAVENOUS | Status: DC | PRN

## 2019-11-28 MED ORDER — METHYLPREDNISOLONE SODIUM SUCC 40 MG IJ SOLR
40.0000 mg | Freq: Every day | INTRAMUSCULAR | Status: DC
  Administered 2019-11-29 – 2019-11-30 (×2): 40 mg via INTRAVENOUS
  Filled 2019-11-28 (×2): qty 1

## 2019-11-28 MED ORDER — FUROSEMIDE 40 MG PO TABS
40.0000 mg | ORAL_TABLET | Freq: Once | ORAL | Status: AC
  Administered 2019-11-28: 40 mg via ORAL
  Filled 2019-11-28: qty 1

## 2019-11-28 MED ORDER — ORAL CARE MOUTH RINSE
15.0000 mL | Freq: Two times a day (BID) | OROMUCOSAL | Status: DC
  Administered 2019-11-28 – 2019-11-30 (×5): 15 mL via OROMUCOSAL

## 2019-11-28 NOTE — Progress Notes (Signed)
PROGRESS NOTE                                                                                                                                                                                                             Patient Demographics:    Leah Lawson, is a 64 y.o. female, DOB - 08-Mar-1956, MCN:[id number]  Outpatient Primary MD for the patient is [facility]    LOS - 3  Admit date - 11/25/2019    Chief Complaint  Patient presents with  . Covid Exposure  . Shortness of Breath       Brief Narrative - Leah Lawson is a 64 y.o. female with medical history significant for COPD, Type 2 diabetes, HTN who presented with worsening shortness of breath, he was diagnosed with acute hypoxic respiratory failure due to COVID-19 pneumonia and admitted.   Subjective:   Patient in bed, appears comfortable, denies any headache, no fever, no chest pain or pressure, no shortness of breath , no abdominal pain. No focal weakness.   Assessment  & Plan :      1. Acute Hypoxic Resp. Failure due to Acute Covid 19 Viral Pneumonitis during the ongoing 2020 Covid 19 Pandemic - she has moderate disease and has been treated with IV steroids and remdesivir with good effect, she seems to be stabilizing, will start tapering steroids and will continue to monitor closely.  CRP certainly coming down and so are her oxygen requirements.   Encouraged the patient to sit up in chair in the daytime use I-S and flutter valve for pulmonary toiletry and then prone in bed when at night.  Will advance activity and titrate down oxygen as possible.   SpO2: 90 % O2 Flow Rate (L/min): 2 L/min  Recent Labs  Lab 11/25/19 1912 11/25/19 1933 11/26/19 0316 11/26/19 1134 11/27/19 0316 11/28/19 0826  CRP 6.8*  --  7.9*  --  4.0* 0.8  DDIMER 0.66*  --   --  1.31* 0.71* 0.52*  BNP 194.8*  --   --   --  96.5  --   PROCALCITON <0.10  --    --   --  <0.10  --   SARSCOV2NAA  --  POSITIVE*  --   --   --   --     Hepatic Function Latest Ref Rng &  Units 11/28/2019 11/27/2019 11/26/2019  Total Protein 6.5 - 8.1 g/dL 6.3(L) 5.9(L) 6.5  Albumin 3.5 - 5.0 g/dL 3.0(L) 2.7(L) 3.2(L)  AST 15 - 41 U/L 19 22 36  ALT 0 - 44 U/L '16 20 24  '$ Alk Phosphatase 38 - 126 U/L 66 61 75  Total Bilirubin 0.3 - 1.2 mg/dL 0.7 0.6 0.7     2.  Hypertension.  Currently blood pressure stable off of medications.  3.  Morbid obesity.  BMI of 44.  Follow with PCP for weight loss.  4.  DM type II.  For now sliding scale and monitor.  Lab Results  Component Value Date   HGBA1C 7.2 (H) 11/26/2019   CBG (last 3)  Recent Labs    11/27/19 1632 11/27/19 2151 11/28/19 0729  GLUCAP 187* 186* 174*       Condition - Extremely Guarded  Family Communication  :  Tiffany [phone] on 11/27/19 at 2:10 PM, message left.  Code Status :  Full  Diet :   Diet Order            Diet heart healthy/carb modified Room service appropriate? Yes; Fluid consistency: Thin  Diet effective now               Disposition Plan  : Stay inpatient in the hospital to complete IV remdesivir course for COVID-19 pneumonia causing acute hypoxic respiratory failure.  Consults  : None  Procedures  : None  PUD Prophylaxis : None  DVT Prophylaxis  :  Lovenox   Lab Results  Component Value Date   PLT 297 11/28/2019    Inpatient Medications  Scheduled Meds: . benzonatate  200 mg Oral TID  . enoxaparin (LOVENOX) injection  50 mg Subcutaneous Daily  . famotidine  20 mg Oral Daily  . insulin aspart  0-9 Units Subcutaneous TID WC  . Ipratropium-Albuterol  1 puff Inhalation TID  . mouth rinse  15 mL Mouth Rinse BID  . methylPREDNISolone (SOLU-MEDROL) injection  50 mg Intravenous Q12H   Continuous Infusions: . sodium chloride    . remdesivir 100 mg in NS 100 mL 100 mg (11/28/19 0737)   PRN Meds:.sodium chloride, albuterol, chlorpheniramine-HYDROcodone, ondansetron  (ZOFRAN) IV  Antibiotics  :    Anti-infectives (From admission, onward)   Start     Dose/Rate Route Frequency Ordered Stop   11/26/19 1000  remdesivir 100 mg in sodium chloride 0.9 % 100 mL IVPB     100 mg 200 mL/hr over 30 Minutes Intravenous Daily 11/25/19 2113 11/30/19 0959   11/25/19 2115  remdesivir 200 mg in sodium chloride 0.9% 250 mL IVPB     200 mg 580 mL/hr over 30 Minutes Intravenous Once 11/25/19 2113 11/25/19 2323       Time Spent in minutes  30   [name] M.D on 11/28/2019 at 9:33 AM  To page go to [email or web address] - password [place]  [facility] -  Office  [phone]    See all Orders from today for further details    Objective:   Vitals:   11/28/19 0424 11/28/19 0600 11/28/19 0605 11/28/19 0730  BP: 108/65   114/72  Pulse: 61 (!) 56 (!) 57 (!) 59  Resp: '16 16 15 17  '$ Temp: 97.9 F (36.6 C)   98.3 F (36.8 C)  TempSrc: Oral   Oral  SpO2: 93% (!) 87% (!) 89% 90%  Weight:      Height:        Wt Readings from Last  3 Encounters:  11/27/19 106.5 kg  05/09/18 100.9 kg     Intake/Output Summary (Last 24 hours) at 11/28/2019 0933 Last data filed at 11/28/2019 0230 Gross per 24 hour  Intake 240 ml  Output 1850 ml  Net -1610 ml     Physical Exam  Awake Alert, No new F.N deficits, Normal affect [place].AT,PERRAL Supple Neck,No JVD, No cervical lymphadenopathy appriciated.  Symmetrical Chest wall movement, Good air movement bilaterally, CTAB RRR,No Gallops, Rubs or new Murmurs, No Parasternal Heave +ve B.Sounds, Abd Soft, No tenderness, No organomegaly appriciated, No rebound - guarding or rigidity. No Cyanosis, Clubbing or edema, No new Rash or bruise    Data Review:    CBC Recent Labs  Lab 11/25/19 1912 11/26/19 0316 11/27/19 0316 11/28/19 0826  WBC 7.6 8.2 8.6 10.1  HGB 15.5* 16.0* 13.7 14.8  HCT 49.2* 50.7* 44.0 48.2*  PLT 211 205 218 297  MCV 87.5 88.5 88.5 89.4  MCH 27.6 27.9 27.6 27.5  MCHC 31.5 31.6 31.1 30.7  RDW 16.3*  16.9* 16.1* 16.8*  LYMPHSABS 1.1 0.1* 0.9 1.1  MONOABS 0.5 0.3 0.4 0.5  EOSABS 0.0 0.0 0.0 0.0  BASOSABS 0.1 0.0 0.0 0.0    Chemistries  Recent Labs  Lab 11/25/19 1912 11/26/19 0029 11/26/19 0316 11/27/19 0316 11/28/19 0826  NA 131*  --  138 138 142  K 3.4*  --  4.5 4.7 5.0  CL 93*  --  99 100 103  CO2 28  --  '30 29 31  '$ GLUCOSE 132*  --  179* 182* 174*  BUN 14  --  13 40* 27*  CREATININE 0.73  --  0.75 0.74 0.71  CALCIUM 8.1*  --  8.3* 8.3* 8.7*  AST 31  --  36 22 19  ALT 20  --  '24 20 16  '$ ALKPHOS 76  --  75 61 66  BILITOT 0.6  --  0.7 0.6 0.7  MG  --   --   --  2.2 2.4  HGBA1C  --  7.2*  --   --   --      ------------------------------------------------------------------------------------------------------------------ Recent Labs    11/25/19 1912  TRIG 108    Lab Results  Component Value Date   HGBA1C 7.2 (H) 11/26/2019   ------------------------------------------------------------------------------------------------------------------ No results for input(s): TSH, T4TOTAL, T3FREE, THYROIDAB in the last 72 hours.  Invalid input(s): FREET3  Cardiac Enzymes No results for input(s): CKMB, TROPONINI, MYOGLOBIN in the last 168 hours.  Invalid input(s): CK ------------------------------------------------------------------------------------------------------------------    Component Value Date/Time   BNP 96.5 11/27/2019 0316    Micro Results Recent Results (from the past 240 hour(s))  Blood Culture (routine x 2)     Status: None (Preliminary result)   Collection Time: 11/25/19  7:11 PM   Specimen: BLOOD  Result Value Ref Range Status   Specimen Description BLOOD RIGHT ANTECUBITAL  Final   Special Requests   Final    BOTTLES DRAWN AEROBIC AND ANAEROBIC Blood Culture results may not be optimal due to an excessive volume of blood received in culture bottles   Culture   Final    NO GROWTH 2 DAYS Performed at [facility], [place] [address],  [place], [place] [address]    Report Status PENDING  Incomplete  Blood Culture (routine x 2)     Status: None (Preliminary result)   Collection Time: 11/25/19  7:12 PM   Specimen: BLOOD RIGHT FOREARM  Result Value Ref Range Status   Specimen  Description BLOOD RIGHT FOREARM  Final   Special Requests   Final    BOTTLES DRAWN AEROBIC AND ANAEROBIC Blood Culture results may not be optimal due to an excessive volume of blood received in culture bottles   Culture   Final    NO GROWTH 2 DAYS Performed at [facility], [place] [address], [place], [place] [address]    Report Status PENDING  Incomplete  Respiratory Panel by RT PCR (Flu A&B, Covid) - Nasopharyngeal Swab     Status: Abnormal   Collection Time: 11/25/19  7:33 PM   Specimen: Nasopharyngeal Swab  Result Value Ref Range Status   SARS Coronavirus 2 by RT PCR POSITIVE (A) NEGATIVE Final    Comment: RESULT CALLED TO, READ BACK BY AND VERIFIED WITH: Teofilo Pod RN 11/25/19 2132 JDW (NOTE) SARS-CoV-2 target nucleic acids are DETECTED. SARS-CoV-2 RNA is generally detectable in upper respiratory specimens  during the acute phase of infection. Positive results are indicative of the presence of the identified virus, but do not rule out bacterial infection or co-infection with other pathogens not detected by the test. Clinical correlation with patient history and other diagnostic information is necessary to determine patient infection status. The expected result is Negative. Fact Sheet for Patients:  PinkCheek.be Fact Sheet for Healthcare Providers: GravelBags.it This test is not yet approved or cleared by the [place] FDA and  has been authorized for detection and/or diagnosis of SARS-CoV-2 by FDA under an Emergency Use Authorization (EUA).  This EUA will remain in effect (meaning this test can be used) for the  duration of  the COVID-19 declaration under Section 564(b)(1) of the  Act, 21 U.S.C. section 360bbb-3(b)(1), unless the authorization is terminated or revoked sooner.    Influenza A by PCR NEGATIVE NEGATIVE Final   Influenza B by PCR NEGATIVE NEGATIVE Final    Comment: (NOTE) The Xpert Xpress SARS-CoV-2/FLU/RSV assay is intended as an aid in  the diagnosis of influenza from Nasopharyngeal swab specimens and  should not be used as a sole basis for treatment. Nasal washings and  aspirates are unacceptable for Xpert Xpress SARS-CoV-2/FLU/RSV  testing. Fact Sheet for Patients: PinkCheek.be Fact Sheet for Healthcare Providers: GravelBags.it This test is not yet approved or cleared by the [place] FDA and  has been authorized for detection and/or diagnosis of SARS-CoV-2 by  FDA under an Emergency Use Authorization (EUA). This EUA will remain  in effect (meaning this test can be used) for the duration of the  Covid-19 declaration under Section 564(b)(1) of the Act, 21  U.S.C. section 360bbb-3(b)(1), unless the authorization is  terminated or revoked. Performed at [facility], [place] [address], [place], [place] [address]   MRSA PCR Screening     Status: None   Collection Time: 11/28/19  4:28 AM   Specimen: Nasal Mucosa; Nasopharyngeal  Result Value Ref Range Status   MRSA by PCR NEGATIVE NEGATIVE Final    Comment:        The GeneXpert MRSA Assay (FDA approved for NASAL specimens only), is one component of a comprehensive MRSA colonization surveillance program. It is not intended to diagnose MRSA infection nor to guide or monitor treatment for MRSA infections. Performed at [facility], [place] [address] [place], [place] [address]     Radiology Reports DG Chest Stites 1 View  Result Date: 11/25/2019 CLINICAL DATA:  Shortness of breath. COVID positive last week. Hypoxia. EXAM: PORTABLE CHEST 1 VIEW COMPARISON:  Radiograph 05/10/2018 FINDINGS:  Cardiomegaly. Unchanged mediastinal  contours. Aortic atherosclerosis. Diffuse interstitial opacities with superimposed patchy consolidation in both mid lower lung zones. No significant pleural effusion. No pneumothorax. Stable osseous structures IMPRESSION: Diffuse interstitial opacities with superimposed patchy consolidation in both mid lower lung zones, consistent with multifocal pneumonia, pattern consistent with COVID-19. An element of underlying pulmonary edema is considered. Cardiomegaly. Electronically Signed   By: Keith Rake M.D.   On: 11/25/2019 19:17

## 2019-11-28 NOTE — Progress Notes (Signed)
   11/28/19 0224  Assess: MEWS Score  Pulse Rate 84  ECG Heart Rate 87  Resp (!) 26  SpO2 (!) 78 %  O2 Device Nasal Cannula  Patient Activity (if Appropriate) Ambulating  O2 Flow Rate (L/min) 2 L/min  Assess: MEWS Score  MEWS Temp 0  MEWS Systolic 0  MEWS Pulse 0  MEWS RR 2  MEWS LOC 0  MEWS Score 2  MEWS Score Color Yellow  Assess: if the MEWS score is Yellow or Red  Were vital signs taken at a resting state? No  Early Detection of Sepsis Score *See Row Information* Low  MEWS guidelines implemented *See Row Information* No, vital signs rechecked  Treat  MEWS Interventions Other (Comment) (Rest)   Patient up to chair and BSC a few times this shift. Lowest SpO2 was 70s while standing. Waveform was normal. Patient SOB, irritated about getting wrapped up in monitor cords and monitor alarming. Patient trying to get up to [facility] independently, but encouraged multiple times to use call button if assistance is needed. At times, patient is heard yelling out for assistance instead of using call button. SpO2 increased to 83%, then 87% on O2 2L [place] within 5 minutes. Throughout the shift, SpO2 mostly above 90% on O2 2L [place] while resting in bed. At times, SpO2 dropped to 87-89% on O2 2L [place] while resting in bed, but this only lasted a few minutes. Will continue to monitor.

## 2019-11-28 NOTE — Progress Notes (Signed)
Attempted pt to RA resting. Pt dropped to 86%

## 2019-11-29 LAB — CBC WITH DIFFERENTIAL/PLATELET
Abs Immature Granulocytes: 0.11 10*3/uL — ABNORMAL HIGH (ref 0.00–0.07)
Basophils Absolute: 0 10*3/uL (ref 0.0–0.1)
Basophils Relative: 0 %
Eosinophils Absolute: 0 10*3/uL (ref 0.0–0.5)
Eosinophils Relative: 0 %
HCT: 43 % (ref 36.0–46.0)
Hemoglobin: 13.5 g/dL (ref 12.0–15.0)
Immature Granulocytes: 1 %
Lymphocytes Relative: 18 %
Lymphs Abs: 1.5 10*3/uL (ref 0.7–4.0)
MCH: 27.7 pg (ref 26.0–34.0)
MCHC: 31.4 g/dL (ref 30.0–36.0)
MCV: 88.3 fL (ref 80.0–100.0)
Monocytes Absolute: 0.7 10*3/uL (ref 0.1–1.0)
Monocytes Relative: 8 %
Neutro Abs: 6.3 10*3/uL (ref 1.7–7.7)
Neutrophils Relative %: 73 %
Platelets: 316 10*3/uL (ref 150–400)
RBC: 4.87 MIL/uL (ref 3.87–5.11)
RDW: 15.9 % — ABNORMAL HIGH (ref 11.5–15.5)
WBC: 8.6 10*3/uL (ref 4.0–10.5)
nRBC: 0.5 % — ABNORMAL HIGH (ref 0.0–0.2)

## 2019-11-29 LAB — GLUCOSE, CAPILLARY
Glucose-Capillary: 103 mg/dL — ABNORMAL HIGH (ref 70–99)
Glucose-Capillary: 164 mg/dL — ABNORMAL HIGH (ref 70–99)
Glucose-Capillary: 196 mg/dL — ABNORMAL HIGH (ref 70–99)
Glucose-Capillary: 277 mg/dL — ABNORMAL HIGH (ref 70–99)

## 2019-11-29 LAB — MAGNESIUM: Magnesium: 2.3 mg/dL (ref 1.7–2.4)

## 2019-11-29 LAB — COMPREHENSIVE METABOLIC PANEL
ALT: 14 U/L (ref 0–44)
AST: 12 U/L — ABNORMAL LOW (ref 15–41)
Albumin: 2.7 g/dL — ABNORMAL LOW (ref 3.5–5.0)
Alkaline Phosphatase: 55 U/L (ref 38–126)
Anion gap: 7 (ref 5–15)
BUN: 32 mg/dL — ABNORMAL HIGH (ref 8–23)
CO2: 34 mmol/L — ABNORMAL HIGH (ref 22–32)
Calcium: 8.2 mg/dL — ABNORMAL LOW (ref 8.9–10.3)
Chloride: 102 mmol/L (ref 98–111)
Creatinine, Ser: 0.9 mg/dL (ref 0.44–1.00)
GFR calc Af Amer: >60 mL/min (ref >60)
GFR calc non Af Amer: >60 mL/min (ref >60)
Glucose, Bld: 133 mg/dL — ABNORMAL HIGH (ref 70–99)
Potassium: 4.2 mmol/L (ref 3.5–5.1)
Sodium: 143 mmol/L (ref 135–145)
Total Bilirubin: 1 mg/dL (ref 0.3–1.2)
Total Protein: 5.1 g/dL — ABNORMAL LOW (ref 6.5–8.1)

## 2019-11-29 LAB — D-DIMER, QUANTITATIVE: D-Dimer, Quant: 0.41 ug/mL-FEU (ref 0.00–0.50)

## 2019-11-29 LAB — BRAIN NATRIURETIC PEPTIDE: B Natriuretic Peptide: 153.7 pg/mL — ABNORMAL HIGH (ref 0.0–100.0)

## 2019-11-29 LAB — C-REACTIVE PROTEIN: CRP: 0.7 mg/dL (ref <1.0)

## 2019-11-29 LAB — PROCALCITONIN: Procalcitonin: <0.1 ng/mL

## 2019-11-29 MED ORDER — FUROSEMIDE 40 MG PO TABS
40.0000 mg | ORAL_TABLET | Freq: Once | ORAL | Status: AC
  Administered 2019-11-29: 40 mg via ORAL
  Filled 2019-11-29: qty 1

## 2019-11-29 NOTE — Progress Notes (Signed)
SATURATION QUALIFICATIONS: (This note is used to comply with regulatory documentation for home oxygen)  Patient Saturations on Room Air at Rest = 89%  Patient Saturations on Room Air while Ambulating = 82%  Patient Saturations on 2 Liters of oxygen while Ambulating = 90%  Please briefly explain why patient needs home oxygen: Pt O2 desaturated to high 70s-low 80s on RA while ambulating.

## 2019-11-29 NOTE — TOC Initial Note (Signed)
Transition of Care [facility]) - Initial/Assessment Note    Patient Details  Name: Leah Lawson MRN: [id number] Date of Birth: [birth year]  Transition of Care [facility] CM/SW Contact:    Maryclare Labrador, RN Phone Number: 11/29/2019, 1:27 PM  Clinical Narrative:   Per pt she was independent PTA. Pt confirms she has a PCP and would like to remain with current practice.  Pt also confirms she is not insured.  Pt  In agreement with home oxygen as ordered.  Pt in agreement with [employer] for agency charity.  CM provided [place] referral.                 Expected Discharge Plan: Home/Self Care Barriers to Discharge: Continued Medical Work up   Patient Goals and CMS Choice Patient states their goals for this hospitalization and ongoing recovery are:: Pt did not specifiy a specific goal CMS Medicare.gov Compare Post Acute Care list provided to:: Patient Choice offered to / list presented to : Patient  Expected Discharge Plan and Services Expected Discharge Plan: Home/Self Care     Post Acute Care Choice: Durable Medical Equipment Living arrangements for the past 2 months: Single Family Home                 DME Arranged: Oxygen DME Agency: [employer] Date DME Agency Contacted: 11/29/19 Time DME Agency Contacted: [id number] Representative spoke with at DME Agency: [place] Arrangements/Services Living arrangements for the past 2 months: [place] Lives with:: Self Patient language and need for interpreter reviewed:: Yes Do you feel safe going back to the place where you live?: Yes      Need for Family Participation in Patient Care: No (Comment) Care giver support system in place?: Yes (comment)   Criminal Activity/Legal Involvement Pertinent to Current Situation/Hospitalization: No - Comment as needed  Activities of Daily Living Home Assistive Devices/Equipment: Eyeglasses, Dentures (specify type), CBG Meter, Nebulizer(Upper dentures.) ADL Screening  (condition at time of admission) Patient's cognitive ability adequate to safely complete daily activities?: Yes Is the patient deaf or have difficulty hearing?: No Does the patient have difficulty seeing, even when wearing glasses/contacts?: No Does the patient have difficulty concentrating, remembering, or making decisions?: No Patient able to express need for assistance with ADLs?: Yes Does the patient have difficulty dressing or bathing?: No Independently performs ADLs?: Yes (appropriate for developmental age) Communication: Independent Dressing (OT): Needs assistance Is this a change from baseline?: Change from baseline, expected to last <3days Grooming: Independent Feeding: Independent Bathing: Needs assistance Is this a change from baseline?: Change from baseline, expected to last <3 days Toileting: Needs assistance Is this a change from baseline?: Change from baseline, expected to last <3 days In/Out Bed: Needs assistance Is this a change from baseline?: Change from baseline, expected to last <3 days Walks in Home: Independent Does the patient have difficulty walking or climbing stairs?: Yes Weakness of Legs: Both Weakness of Arms/Hands: None  Permission Sought/Granted   Permission granted to share information with : Yes, Verbal Permission Granted              Emotional Assessment   Attitude/Demeanor/Rapport: Self-Confident Affect (typically observed): Accepting Orientation: : Oriented to Self, Oriented to Place, Oriented to  Time, Oriented to Situation   Psych Involvement: No (comment)  Admission diagnosis:  Acute respiratory failure with hypoxia ([place]) [J96.01] Pneumonia due to COVID-19 virus [U07.1, J12.82] COVID-19 [U07.1] Patient Active Problem List  Diagnosis Date Noted  . Pneumonia due to COVID-19 virus 11/25/2019  . Obesity, Class III, BMI 40-49.9 (morbid obesity) ([place]) 11/25/2019  . Tobacco abuse 05/08/2018  . COPD exacerbation ([place]) 05/08/2018  .  Acute on chronic respiratory failure with hypoxia ([place]) 05/08/2018  . Hypertension   . Diabetes mellitus without complication ([place])    PCP:  [facility]:   [place], [place] [place] [place] [address] Phone: [phone] Fax: [phone]     Social Determinants of Health (SDOH) Interventions    Readmission Risk Interventions No flowsheet data found.

## 2019-11-29 NOTE — Progress Notes (Signed)
   11/29/19 0122  Assess: MEWS Score  Pulse Rate 100  SpO2 (!) 74 %  O2 Device Room Air  Patient Activity (if Appropriate) Other (Comment) (Patient up to BSC, O2 came off.)  Assess: MEWS Score  MEWS Temp 0  MEWS Systolic 0  MEWS Pulse 0  MEWS RR 0  MEWS LOC 0  MEWS Score 0  MEWS Score Color Green   Patient rested in bed most shift. SpO2 mostly stayed 90% or more on O2 1L [place]. SpO2 easily drops with exertion, and O2 came off a couple times, dropping patient as low as 74% on RA with exertion, but mostly 80s. Patient able to ambulate to bathroom independently, leaving O2 on. SpO2 recovers within minutes during rest. Patient resting in bed at this time with no distress noted. Will continue to monitor.

## 2019-11-29 NOTE — Progress Notes (Signed)
PROGRESS NOTE                                                                                                                                                                                                             Patient Demographics:    Leah Lawson, is a 64 y.o. female, DOB - [birth year], EFE:[id number]  Outpatient Primary MD for the patient is [facility]    LOS - 4  Admit date - 11/25/2019    Chief Complaint  Patient presents with  . Covid Exposure  . Shortness of Breath       Brief Narrative - Leah Lawson is a 64 y.o. female with medical history significant for COPD, Type 2 diabetes, HTN who presented with worsening shortness of breath, he was diagnosed with acute hypoxic respiratory failure due to COVID-19 pneumonia and admitted.   Subjective:   Patient in bed, appears comfortable, denies any headache, no fever, no chest pain or pressure, moving shortness of breath , no abdominal pain. No focal weakness.   Assessment  & Plan :      1. Acute Hypoxic Resp. Failure due to Acute Covid 19 Viral Pneumonitis during the ongoing 2020 Covid 19 Pandemic - she has moderate disease and has been treated with IV steroids and remdesivir with good effect, she seems to be stabilizing, continue to taper down oxygen, advance activity and taper steroids , clinically much better, has developed a few Rales on 11/29/2019 hence will repeat Lasix, if stable likely discharge on 11/30/2019 depending on clinical scenario.   Encouraged the patient to sit up in chair in the daytime use I-S and flutter valve for pulmonary toiletry and then prone in bed when at night.  Will advance activity and titrate down oxygen as possible.   SpO2: 92 % O2 Flow Rate (L/min): 1 L/min  Recent Labs  Lab 11/25/19 1912 11/25/19 1933 11/26/19 0316 11/26/19 1134 11/27/19 0316 11/28/19 0826 11/29/19 0356  CRP 6.8*  --  7.9*  --   4.0* 0.8 0.7  DDIMER 0.66*  --   --  1.31* 0.71* 0.52* 0.41  BNP 194.8*  --   --   --  96.5 163.2* 153.7*  PROCALCITON <0.10  --   --   --  <0.10 <0.10 <0.10  SARSCOV2NAA  --  POSITIVE*  --   --   --   --   --  Hepatic Function Latest Ref Rng & Units 11/29/2019 11/28/2019 11/27/2019  Total Protein 6.5 - 8.1 g/dL 5.1(L) 6.3(L) 5.9(L)  Albumin 3.5 - 5.0 g/dL 2.7(L) 3.0(L) 2.7(L)  AST 15 - 41 U/L 12(L) 19 22  ALT 0 - 44 U/L '14 16 20  '$ Alk Phosphatase 38 - 126 U/L 55 66 61  Total Bilirubin 0.3 - 1.2 mg/dL 1.0 0.7 0.6     2.  Hypertension.  Currently blood pressure stable off of medications.  3.  Morbid obesity.  BMI of 44.  Follow with PCP for weight loss.  4.  Few bibasilar Rales. Lasix 11/29/2019 and monitor.    5. DM type II.  For now sliding scale and monitor.  Lab Results  Component Value Date   HGBA1C 7.2 (H) 11/26/2019   CBG (last 3)  Recent Labs    11/28/19 1651 11/28/19 2130 11/29/19 0746  GLUCAP 256* 168* 103*       Condition - Extremely Guarded  Family Communication  :  Tiffany [phone] on 11/27/19 at 2:10 PM, message left.  Code Status :  Full  Diet :   Diet Order            Diet heart healthy/carb modified Room service appropriate? Yes; Fluid consistency: Thin  Diet effective now               Disposition Plan  : Stay inpatient in the hospital to complete IV remdesivir course for COVID-19 pneumonia causing acute hypoxic respiratory failure.  Finishing her treatment if stable likely home discharge on 11/30/2019.  Consults  : None  Procedures  : None  PUD Prophylaxis : None  DVT Prophylaxis  :  Lovenox   Lab Results  Component Value Date   PLT 316 11/29/2019    Inpatient Medications  Scheduled Meds: . benzonatate  200 mg Oral TID  . enoxaparin (LOVENOX) injection  50 mg Subcutaneous Daily  . famotidine  20 mg Oral Daily  . furosemide  40 mg Oral Once  . insulin aspart  0-9 Units Subcutaneous TID WC  . Ipratropium-Albuterol  1 puff  Inhalation TID  . mouth rinse  15 mL Mouth Rinse BID  . methylPREDNISolone (SOLU-MEDROL) injection  40 mg Intravenous Daily   Continuous Infusions: . sodium chloride    . remdesivir 100 mg in NS 100 mL Stopped (11/28/19 1900)   PRN Meds:.sodium chloride, albuterol, chlorpheniramine-HYDROcodone, ondansetron (ZOFRAN) IV  Antibiotics  :    Anti-infectives (From admission, onward)   Start     Dose/Rate Route Frequency Ordered Stop   11/26/19 1000  remdesivir 100 mg in sodium chloride 0.9 % 100 mL IVPB     100 mg 200 mL/hr over 30 Minutes Intravenous Daily 11/25/19 2113 11/30/19 0959   11/25/19 2115  remdesivir 200 mg in sodium chloride 0.9% 250 mL IVPB     200 mg 580 mL/hr over 30 Minutes Intravenous Once 11/25/19 2113 11/25/19 2323       Time Spent in minutes  30   [name] M.D on 11/29/2019 at 9:20 AM  To page go to [email or web address] - password [facility]  [facility] -  Office  [phone]    See all Orders from today for further details    Objective:   Vitals:   11/29/19 0600 11/29/19 0659 11/29/19 0700 11/29/19 0702  BP:      Pulse: 60 99 (!) 111 80  Resp:      Temp:      TempSrc:  SpO2: 92% (!) 78% (!) 84% 92%  Weight:      Height:        Wt Readings from Last 3 Encounters:  11/27/19 106.5 kg  05/09/18 100.9 kg     Intake/Output Summary (Last 24 hours) at 11/29/2019 0920 Last data filed at 11/29/2019 0700 Gross per 24 hour  Intake 940 ml  Output 850 ml  Net 90 ml     Physical Exam  Awake Alert, No new F.N deficits, Normal affect [place].AT,PERRAL Supple Neck,No JVD, No cervical lymphadenopathy appriciated.  Symmetrical Chest wall movement, Good air movement bilaterally, few rales RRR,No Gallops, Rubs or new Murmurs, No Parasternal Heave +ve B.Sounds, Abd Soft, No tenderness, No organomegaly appriciated, No rebound - guarding or rigidity. No Cyanosis, Clubbing or edema, No new Rash or bruise   Data Review:    CBC Recent Labs  Lab  11/25/19 1912 11/26/19 0316 11/27/19 0316 11/28/19 0826 11/29/19 0356  WBC 7.6 8.2 8.6 10.1 8.6  HGB 15.5* 16.0* 13.7 14.8 13.5  HCT 49.2* 50.7* 44.0 48.2* 43.0  PLT 211 205 218 297 316  MCV 87.5 88.5 88.5 89.4 88.3  MCH 27.6 27.9 27.6 27.5 27.7  MCHC 31.5 31.6 31.1 30.7 31.4  RDW 16.3* 16.9* 16.1* 16.8* 15.9*  LYMPHSABS 1.1 0.1* 0.9 1.1 1.5  MONOABS 0.5 0.3 0.4 0.5 0.7  EOSABS 0.0 0.0 0.0 0.0 0.0  BASOSABS 0.1 0.0 0.0 0.0 0.0    Chemistries  Recent Labs  Lab 11/25/19 1912 11/26/19 0029 11/26/19 0316 11/27/19 0316 11/28/19 0826 11/29/19 0356  NA 131*  --  138 138 142 143  K 3.4*  --  4.5 4.7 5.0 4.2  CL 93*  --  99 100 103 102  CO2 28  --  '30 29 31 '$ 34*  GLUCOSE 132*  --  179* 182* 174* 133*  BUN 14  --  13 40* 27* 32*  CREATININE 0.73  --  0.75 0.74 0.71 0.90  CALCIUM 8.1*  --  8.3* 8.3* 8.7* 8.2*  AST 31  --  36 22 19 12*  ALT 20  --  '24 20 16 14  '$ ALKPHOS 76  --  75 61 66 55  BILITOT 0.6  --  0.7 0.6 0.7 1.0  MG  --   --   --  2.2 2.4 2.3  HGBA1C  --  7.2*  --   --   --   --      ------------------------------------------------------------------------------------------------------------------ No results for input(s): CHOL, HDL, LDLCALC, TRIG, CHOLHDL, LDLDIRECT in the last 72 hours.  Lab Results  Component Value Date   HGBA1C 7.2 (H) 11/26/2019   ------------------------------------------------------------------------------------------------------------------ No results for input(s): TSH, T4TOTAL, T3FREE, THYROIDAB in the last 72 hours.  Invalid input(s): FREET3  Cardiac Enzymes No results for input(s): CKMB, TROPONINI, MYOGLOBIN in the last 168 hours.  Invalid input(s): CK ------------------------------------------------------------------------------------------------------------------    Component Value Date/Time   BNP 153.7 (H) 11/29/2019 0356    Micro Results Recent Results (from the past 240 hour(s))  Blood Culture (routine x 2)     Status:  None (Preliminary result)   Collection Time: 11/25/19  7:11 PM   Specimen: BLOOD  Result Value Ref Range Status   Specimen Description BLOOD RIGHT ANTECUBITAL  Final   Special Requests   Final    BOTTLES DRAWN AEROBIC AND ANAEROBIC Blood Culture results may not be optimal due to an excessive volume of blood received in culture bottles   Culture   Final  NO GROWTH 3 DAYS Performed at [facility], [place] [address], [place], [place] [address]    Report Status PENDING  Incomplete  Blood Culture (routine x 2)     Status: None (Preliminary result)   Collection Time: 11/25/19  7:12 PM   Specimen: BLOOD RIGHT FOREARM  Result Value Ref Range Status   Specimen Description BLOOD RIGHT FOREARM  Final   Special Requests   Final    BOTTLES DRAWN AEROBIC AND ANAEROBIC Blood Culture results may not be optimal due to an excessive volume of blood received in culture bottles   Culture   Final    NO GROWTH 3 DAYS Performed at [facility], [place] [address], [place], [place] [address]    Report Status PENDING  Incomplete  Respiratory Panel by RT PCR (Flu A&B, Covid) - Nasopharyngeal Swab     Status: Abnormal   Collection Time: 11/25/19  7:33 PM   Specimen: Nasopharyngeal Swab  Result Value Ref Range Status   SARS Coronavirus 2 by RT PCR POSITIVE (A) NEGATIVE Final    Comment: RESULT CALLED TO, READ BACK BY AND VERIFIED WITH: Teofilo Pod RN 11/25/19 2132 JDW (NOTE) SARS-CoV-2 target nucleic acids are DETECTED. SARS-CoV-2 RNA is generally detectable in upper respiratory specimens  during the acute phase of infection. Positive results are indicative of the presence of the identified virus, but do not rule out bacterial infection or co-infection with other pathogens not detected by the test. Clinical correlation with patient history and other diagnostic information is necessary to determine patient infection status. The expected result is Negative. Fact Sheet for Patients:    PinkCheek.be Fact Sheet for Healthcare Providers: GravelBags.it This test is not yet approved or cleared by the [place] FDA and  has been authorized for detection and/or diagnosis of SARS-CoV-2 by FDA under an Emergency Use Authorization (EUA).  This EUA will remain in effect (meaning this test can be used) for the  duration of  the COVID-19 declaration under Section 564(b)(1) of the Act, 21 U.S.C. section 360bbb-3(b)(1), unless the authorization is terminated or revoked sooner.    Influenza A by PCR NEGATIVE NEGATIVE Final   Influenza B by PCR NEGATIVE NEGATIVE Final    Comment: (NOTE) The Xpert Xpress SARS-CoV-2/FLU/RSV assay is intended as an aid in  the diagnosis of influenza from Nasopharyngeal swab specimens and  should not be used as a sole basis for treatment. Nasal washings and  aspirates are unacceptable for Xpert Xpress SARS-CoV-2/FLU/RSV  testing. Fact Sheet for Patients: PinkCheek.be Fact Sheet for Healthcare Providers: GravelBags.it This test is not yet approved or cleared by the [place] FDA and  has been authorized for detection and/or diagnosis of SARS-CoV-2 by  FDA under an Emergency Use Authorization (EUA). This EUA will remain  in effect (meaning this test can be used) for the duration of the  Covid-19 declaration under Section 564(b)(1) of the Act, 21  U.S.C. section 360bbb-3(b)(1), unless the authorization is  terminated or revoked. Performed at [facility], [place] [address], [place],  [address]   MRSA PCR Screening     Status: None   Collection Time: 11/28/19  4:28 AM   Specimen: Nasal Mucosa; Nasopharyngeal  Result Value Ref Range Status   MRSA by PCR NEGATIVE NEGATIVE Final    Comment:        The GeneXpert MRSA Assay (FDA approved for NASAL specimens only), is one component of a comprehensive MRSA  colonization surveillance program. It is  not intended to diagnose MRSA infection nor to guide or monitor treatment for MRSA infections. Performed at [facility], [place] [address], [place], [place] [address]     Radiology Reports DG Chest [place] 1 View  Result Date: 11/25/2019 CLINICAL DATA:  Shortness of breath. COVID positive last week. Hypoxia. EXAM: PORTABLE CHEST 1 VIEW COMPARISON:  Radiograph 05/10/2018 FINDINGS: Cardiomegaly. Unchanged mediastinal contours. Aortic atherosclerosis. Diffuse interstitial opacities with superimposed patchy consolidation in both mid lower lung zones. No significant pleural effusion. No pneumothorax. Stable osseous structures IMPRESSION: Diffuse interstitial opacities with superimposed patchy consolidation in both mid lower lung zones, consistent with multifocal pneumonia, pattern consistent with COVID-19. An element of underlying pulmonary edema is considered. Cardiomegaly. Electronically Signed   By: Keith Rake M.D.   On: 11/25/2019 19:17

## 2019-11-29 NOTE — Progress Notes (Signed)
   11/29/19 0659  Vitals  Pulse Rate 99  Oxygen Therapy  SpO2 (!) 78 %  O2 Device Nasal Cannula  O2 Flow Rate (L/min) 1 L/min  Patient Activity (if Appropriate) Other (Comment) (Patient up to [facility]  MEWS Score  MEWS Temp 0  MEWS Systolic 0  MEWS Pulse 0  MEWS RR 0  MEWS LOC 0  MEWS Score 0  MEWS Score Color Green   Patient up to [facility] independently. Patient was also coughing frequently. SpO2 dropped as low as 78% on O2 1L [place]. SpO2 recovered to 90% or above within 5 minutes. Patient sitting on EOB at this time, no distress noted. Will continue to monitor.

## 2019-11-30 LAB — COMPREHENSIVE METABOLIC PANEL
ALT: 15 U/L (ref 0–44)
AST: 11 U/L — ABNORMAL LOW (ref 15–41)
Albumin: 3 g/dL — ABNORMAL LOW (ref 3.5–5.0)
Alkaline Phosphatase: 55 U/L (ref 38–126)
Anion gap: 6 (ref 5–15)
BUN: 23 mg/dL (ref 8–23)
CO2: 32 mmol/L (ref 22–32)
Calcium: 8.5 mg/dL — ABNORMAL LOW (ref 8.9–10.3)
Chloride: 104 mmol/L (ref 98–111)
Creatinine, Ser: 0.73 mg/dL (ref 0.44–1.00)
GFR calc Af Amer: >60 mL/min (ref >60)
GFR calc non Af Amer: >60 mL/min (ref >60)
Glucose, Bld: 107 mg/dL — ABNORMAL HIGH (ref 70–99)
Potassium: 4.1 mmol/L (ref 3.5–5.1)
Sodium: 142 mmol/L (ref 135–145)
Total Bilirubin: 0.9 mg/dL (ref 0.3–1.2)
Total Protein: 6.1 g/dL — ABNORMAL LOW (ref 6.5–8.1)

## 2019-11-30 LAB — CBC WITH DIFFERENTIAL/PLATELET
Abs Immature Granulocytes: 0.1 10*3/uL — ABNORMAL HIGH (ref 0.00–0.07)
Basophils Absolute: 0 10*3/uL (ref 0.0–0.1)
Basophils Relative: 0 %
Eosinophils Absolute: 0 10*3/uL (ref 0.0–0.5)
Eosinophils Relative: 0 %
HCT: 47.2 % — ABNORMAL HIGH (ref 36.0–46.0)
Hemoglobin: 14.8 g/dL (ref 12.0–15.0)
Immature Granulocytes: 2 %
Lymphocytes Relative: 26 %
Lymphs Abs: 1.7 10*3/uL (ref 0.7–4.0)
MCH: 27.4 pg (ref 26.0–34.0)
MCHC: 31.4 g/dL (ref 30.0–36.0)
MCV: 87.4 fL (ref 80.0–100.0)
Monocytes Absolute: 0.6 10*3/uL (ref 0.1–1.0)
Monocytes Relative: 9 %
Neutro Abs: 4.1 10*3/uL (ref 1.7–7.7)
Neutrophils Relative %: 63 %
Platelets: 365 10*3/uL (ref 150–400)
RBC: 5.4 MIL/uL — ABNORMAL HIGH (ref 3.87–5.11)
RDW: 15.9 % — ABNORMAL HIGH (ref 11.5–15.5)
WBC: 6.5 10*3/uL (ref 4.0–10.5)
nRBC: 0 % (ref 0.0–0.2)

## 2019-11-30 LAB — C-REACTIVE PROTEIN: CRP: 0.6 mg/dL (ref <1.0)

## 2019-11-30 LAB — BRAIN NATRIURETIC PEPTIDE: B Natriuretic Peptide: 62.7 pg/mL (ref 0.0–100.0)

## 2019-11-30 LAB — CULTURE, BLOOD (ROUTINE X 2)
Culture: NO GROWTH
Culture: NO GROWTH

## 2019-11-30 LAB — GLUCOSE, CAPILLARY
Glucose-Capillary: 99 mg/dL (ref 70–99)
Glucose-Capillary: 138 mg/dL — ABNORMAL HIGH (ref 70–99)

## 2019-11-30 NOTE — Progress Notes (Signed)
Pt given discharge instructions, prescriptions, and care notes. Pt verbalized understanding AEB no further questions or concerns at this time. IV was discontinued, no redness, pain, or swelling noted at this time. Telemetry discontinued and Centralized Telemetry was notified. Pt left the floor via wheelchair with staff in stable condition. 

## 2019-11-30 NOTE — Discharge Summary (Signed)
[name] UYW:[id number] DOB: [birth year] DOA: 11/25/2019  PCP: [facility]  Admit date: 11/25/2019  Discharge date: 11/30/2019  Admitted From: Home   Disposition:  Home   Recommendations for Outpatient Follow-up:   Follow up with PCP in 1-2 weeks  PCP Please obtain BMP/CBC, 2 view CXR in 1week,  (see Discharge instructions)   PCP Please follow up on the following pending results:    Home Health: None   Equipment/Devices: o2 2lit PRN  Consultations: None  Discharge Condition: Stable    CODE STATUS: Full    Diet Recommendation: Heart Healthy Low Carb  Diet Order            Diet heart healthy/carb modified Room service appropriate? Yes; Fluid consistency: Thin  Diet effective now               Chief Complaint  Patient presents with  . Covid Exposure  . Shortness of Breath     Brief history of present illness from the day of admission and additional interim summary    Leah Lawson a 64 y.o.femalewith medical history significant forCOPD, Type 2 diabetes, HTN who presented with worsening shortness of breath, he was diagnosed with acute hypoxic respiratory failure due to COVID-19 pneumonia and admitted.                                                                 Hospital Course   1. Acute Hypoxic Resp. Failure due to Acute Covid 19 Viral Pneumonitis during the ongoing 2020 Covid 19 Pandemic - she has moderate disease and has been treated with IV steroids and remdesivir with good effect, she has completed her full treatment , now symptom-free on room air at rest, upon ambulation she does require 1 to 2 L of oxygen few times which she will be given to use as needed at home if needed, will be discharged home she has completed her treatment course and will follow with PCP  henceforth.    Recent Labs  Lab 11/25/19 1912 11/25/19 1912 11/25/19 1933 11/26/19 0316 11/26/19 1134 11/27/19 0316 11/28/19 0826 11/29/19 0356 11/30/19 0658  CRP 6.8*   < >  --  7.9*  --  4.0* 0.8 0.7 0.6  DDIMER 0.66*  --   --   --  1.31* 0.71* 0.52* 0.41  --   FERRITIN 48  --   --   --   --   --   --   --   --   BNP 194.8*  --   --   --   --  96.5 163.2* 153.7* 62.7  PROCALCITON <0.10  --   --   --   --  <0.10 <0.10 <0.10  --   SARSCOV2NAA  --   --  POSITIVE*  --   --   --   --   --   --    < > =  values in this interval not displayed.    Hepatic Function Latest Ref Rng & Units 11/30/2019 11/29/2019 11/28/2019  Total Protein 6.5 - 8.1 g/dL 6.1(L) 5.1(L) 6.3(L)  Albumin 3.5 - 5.0 g/dL 3.0(L) 2.7(L) 3.0(L)  AST 15 - 41 U/L 11(L) 12(L) 19  ALT 0 - 44 U/L '15 14 16  '$ Alk Phosphatase 38 - 126 U/L 55 55 66  Total Bilirubin 0.3 - 1.2 mg/dL 0.9 1.0 0.7    2.  Hypertension.  Currently blood pressure stable off of medications.  PCP to monitor.  3.  Morbid obesity.  BMI of 44.  Follow with PCP for weight loss.  4.  Few bibasilar Rales. Lasix 11/29/2019 now completely resolved.  5. DM type II.    Continue home regimen.   Discharge diagnosis     Principal Problem:   Pneumonia due to COVID-19 virus Active Problems:   Hypertension   Diabetes mellitus without complication (HCC)   COPD exacerbation (HCC)   Obesity, Class III, BMI 40-49.9 (morbid obesity) [facility])    Discharge instructions    Discharge Instructions    Discharge instructions   Complete by: As directed    Follow with Primary MD [facility] in 7 days   Get CBC, CMP, 2 view Chest X ray -  checked next visit within 1 week by Primary MD   Activity: As tolerated with Full fall precautions use walker/cane & assistance as needed  Disposition Home   Diet: Heart Healthy Low Carb  Special Instructions: If you have smoked or chewed Tobacco  in the last 2 yrs please stop smoking, stop any  regular Alcohol  and or any Recreational drug use.  On your next visit with your primary care physician please Get Medicines reviewed and adjusted.  Please request your Prim.MD to go over all Hospital Tests and Procedure/Radiological results at the follow up, please get all Hospital records sent to your Prim MD by signing hospital release before you go home.  If you experience worsening of your admission symptoms, develop shortness of breath, life threatening emergency, suicidal or homicidal thoughts you must seek medical attention immediately by calling 911 or calling your MD immediately  if symptoms less severe.  You Must read complete instructions/literature along with all the possible adverse reactions/side effects for all the Medicines you take and that have been prescribed to you. Take any new Medicines after you have completely understood and accpet all the possible adverse reactions/side effects.   Increase activity slowly   Complete by: As directed    MyChart COVID-19 home monitoring program   Complete by: Nov 30, 2019    Is the patient willing to use the [facility] for home monitoring?: Yes   Temperature monitoring   Complete by: Nov 30, 2019    After how many days would you like to receive a notification of this patient's flowsheet entries?: 1      Discharge Medications   Allergies as of 11/30/2019      Reactions   Mucinex [guaifenesin Er] Diarrhea   Singulair [montelukast Sodium] Other (See Comments)   headaches      Medication List    STOP taking these medications   doxycycline 100 MG tablet Commonly known as: VIBRA-TABS   predniSONE 5 MG tablet Commonly known as: DELTASONE     TAKE these medications   albuterol 108 (90 Base) MCG/ACT inhaler Commonly known as: VENTOLIN HFA Inhale 1 puff into the lungs every 6 (six) hours as needed  for wheezing or shortness of breath.   ALKA-SELTZER PLS NIGHT CLD/FLU PO Take 2 tablets by mouth as needed (cold  syptoms).   amLODipine 5 MG tablet Commonly known as: NORVASC Take 5 mg by mouth daily.   hydrochlorothiazide 25 MG tablet Commonly known as: HYDRODIURIL Take 25 mg by mouth daily.   ipratropium-albuterol 0.5-2.5 (3) MG/3ML Soln Commonly known as: DUONEB Use twice a day scheduled and every 4 hours as needed for shortness of breath and wheezing   metFORMIN 500 MG tablet Commonly known as: GLUCOPHAGE Take 500 mg by mouth every evening.   multivitamin with minerals tablet Take 1 tablet by mouth daily.   naproxen sodium 220 MG tablet Commonly known as: ALEVE Take 220 mg by mouth daily as needed (pain).            Durable Medical Equipment  (From admission, onward)         Start     Ordered   11/29/19 0919  For home use only DME oxygen  Once    Question Answer Comment  Length of Need 6 Months   Mode or (Route) Nasal cannula   Liters per Minute 2   Frequency Continuous (stationary and portable oxygen unit needed)   Oxygen conserving device Yes   Oxygen delivery system Gas      11/29/19 0918          Follow-up Information    [facility]. Schedule an appointment as soon as possible for a visit in 1 week(s).   Specialty: Family Medicine Contact information: [place] [address] [phone]           Major procedures and Radiology Reports - PLEASE review detailed and final reports thoroughly  -       DG Chest Port 1 View  Result Date: 11/25/2019 CLINICAL DATA:  Shortness of breath. COVID positive last week. Hypoxia. EXAM: PORTABLE CHEST 1 VIEW COMPARISON:  Radiograph 05/10/2018 FINDINGS: Cardiomegaly. Unchanged mediastinal contours. Aortic atherosclerosis. Diffuse interstitial opacities with superimposed patchy consolidation in both mid lower lung zones. No significant pleural effusion. No pneumothorax. Stable osseous structures IMPRESSION: Diffuse interstitial opacities with superimposed patchy consolidation in both  mid lower lung zones, consistent with multifocal pneumonia, pattern consistent with COVID-19. An element of underlying pulmonary edema is considered. Cardiomegaly. Electronically Signed   By: Keith Rake M.D.   On: 11/25/2019 19:17    Micro Results     Recent Results (from the past 240 hour(s))  Blood Culture (routine x 2)     Status: None   Collection Time: 11/25/19  7:11 PM   Specimen: BLOOD  Result Value Ref Range Status   Specimen Description BLOOD RIGHT ANTECUBITAL  Final   Special Requests   Final    BOTTLES DRAWN AEROBIC AND ANAEROBIC Blood Culture results may not be optimal due to an excessive volume of blood received in culture bottles   Culture   Final    NO GROWTH 5 DAYS Performed at [facility], [place] [address] [place], [place] [address]    Report Status 11/30/2019 FINAL  Final  Blood Culture (routine x 2)     Status: None   Collection Time: 11/25/19  7:12 PM   Specimen: BLOOD RIGHT FOREARM  Result Value Ref Range Status   Specimen Description BLOOD RIGHT FOREARM  Final   Special Requests   Final    BOTTLES DRAWN AEROBIC AND ANAEROBIC Blood Culture results may not be optimal due  to an excessive volume of blood received in culture bottles   Culture   Final    NO GROWTH 5 DAYS Performed at [facility], [place] [address], [place], [place] [address]    Report Status 11/30/2019 FINAL  Final  Respiratory Panel by RT PCR (Flu A&B, Covid) - Nasopharyngeal Swab     Status: Abnormal   Collection Time: 11/25/19  7:33 PM   Specimen: Nasopharyngeal Swab  Result Value Ref Range Status   SARS Coronavirus 2 by RT PCR POSITIVE (A) NEGATIVE Final    Comment: RESULT CALLED TO, READ BACK BY AND VERIFIED WITH: Teofilo Pod RN 11/25/19 2132 JDW (NOTE) SARS-CoV-2 target nucleic acids are DETECTED. SARS-CoV-2 RNA is generally detectable in upper respiratory specimens  during the acute phase of infection. Positive results are indicative of the presence of the identified  virus, but do not rule out bacterial infection or co-infection with other pathogens not detected by the test. Clinical correlation with patient history and other diagnostic information is necessary to determine patient infection status. The expected result is Negative. Fact Sheet for Patients:  PinkCheek.be Fact Sheet for Healthcare Providers: GravelBags.it This test is not yet approved or cleared by the [place] FDA and  has been authorized for detection and/or diagnosis of SARS-CoV-2 by FDA under an Emergency Use Authorization (EUA).  This EUA will remain in effect (meaning this test can be used) for the  duration of  the COVID-19 declaration under Section 564(b)(1) of the Act, 21 U.S.C. section 360bbb-3(b)(1), unless the authorization is terminated or revoked sooner.    Influenza A by PCR NEGATIVE NEGATIVE Final   Influenza B by PCR NEGATIVE NEGATIVE Final    Comment: (NOTE) The Xpert Xpress SARS-CoV-2/FLU/RSV assay is intended as an aid in  the diagnosis of influenza from Nasopharyngeal swab specimens and  should not be used as a sole basis for treatment. Nasal washings and  aspirates are unacceptable for Xpert Xpress SARS-CoV-2/FLU/RSV  testing. Fact Sheet for Patients: PinkCheek.be Fact Sheet for Healthcare Providers: GravelBags.it This test is not yet approved or cleared by the [place] FDA and  has been authorized for detection and/or diagnosis of SARS-CoV-2 by  FDA under an Emergency Use Authorization (EUA). This EUA will remain  in effect (meaning this test can be used) for the duration of the  Covid-19 declaration under Section 564(b)(1) of the Act, 21  U.S.C. section 360bbb-3(b)(1), unless the authorization is  terminated or revoked. Performed at [facility], [place] [address], [place], [place] [address]   MRSA PCR Screening     Status:  None   Collection Time: 11/28/19  4:28 AM   Specimen: Nasal Mucosa; Nasopharyngeal  Result Value Ref Range Status   MRSA by PCR NEGATIVE NEGATIVE Final    Comment:        The GeneXpert MRSA Assay (FDA approved for NASAL specimens only), is one component of a comprehensive MRSA colonization surveillance program. It is not intended to diagnose MRSA infection nor to guide or monitor treatment for MRSA infections. Performed at [facility], [place] [address], [place], [place] [address]     Today   Subjective    [place] today has no headache,no chest abdominal pain,no new weakness tingling or numbness, feels much better wants to go home today.     Objective   Blood pressure (!) 137/92, pulse (!) 55, temperature 97.7 F (36.5 C), temperature source Oral, resp. rate 20, height 5' 1.5" (1.562 m),  weight 106.5 kg, SpO2 100 %.   Intake/Output Summary (Last 24 hours) at 11/30/2019 0936 Last data filed at 11/30/2019 0926 Gross per 24 hour  Intake 240 ml  Output 1300 ml  Net -1060 ml    Exam Awake Alert,  No new F.N deficits, Normal affect [place].AT,PERRAL Supple Neck,No JVD, No cervical lymphadenopathy appriciated.  Symmetrical Chest wall movement, Good air movement bilaterally, CTAB RRR,No Gallops,Rubs or new Murmurs, No Parasternal Heave +ve B.Sounds, Abd Soft, Non tender, No organomegaly appriciated, No rebound -guarding or rigidity. No Cyanosis, Clubbing or edema, No new Rash or bruise   Data Review   CBC w Diff:  Lab Results  Component Value Date   WBC 6.5 11/30/2019   HGB 14.8 11/30/2019   HCT 47.2 (H) 11/30/2019   PLT 365 11/30/2019   LYMPHOPCT 26 11/30/2019   MONOPCT 9 11/30/2019   EOSPCT 0 11/30/2019   BASOPCT 0 11/30/2019    CMP:  Lab Results  Component Value Date   NA 142 11/30/2019   K 4.1 11/30/2019   CL 104 11/30/2019   CO2 32 11/30/2019   BUN 23 11/30/2019   CREATININE 0.73 11/30/2019   PROT 6.1 (L) 11/30/2019   ALBUMIN 3.0 (L)  11/30/2019   BILITOT 0.9 11/30/2019   ALKPHOS 55 11/30/2019   AST 11 (L) 11/30/2019   ALT 15 11/30/2019  .   Total Time in preparing paper work, data evaluation and todays exam - 38 minutes  [name] M.D on 11/30/2019 at 9:36 AM  [facility]   Office  [phone]

## 2019-11-30 NOTE — Progress Notes (Signed)
PT Cancellation Note  Patient Details Name: Leah Lawson MRN: [id number] DOB: 1956/06/09   Cancelled Treatment:    Reason Eval/Treat Not Completed: Patient declined, no reason specified;Other (comment)(getting ready to go home, expresses no concerns)  Patient dressed, ready for discharge. She denies having any concerns about her mobility for discharge home. Education on safety with oxygen tube management. Patient steady on feet short distance ambulation in room.  Time spent: 5 minutes nonbillable  Birdie Hopes 11/30/2019, 2:19 PM

## 2019-11-30 NOTE — Discharge Instructions (Signed)
Follow with Primary MD [facility] in 7 days   Get CBC, CMP, 2 view Chest X ray -  checked next visit within 1 week by Primary MD   Activity: As tolerated with Full fall precautions use walker/cane & assistance as needed  Disposition Home   Diet: Heart Healthy Low Carb  Special Instructions: If you have smoked or chewed Tobacco  in the last 2 yrs please stop smoking, stop any regular Alcohol  and or any Recreational drug use.  On your next visit with your primary care physician please Get Medicines reviewed and adjusted.  Please request your Prim.MD to go over all Hospital Tests and Procedure/Radiological results at the follow up, please get all Hospital records sent to your Prim MD by signing hospital release before you go home.  If you experience worsening of your admission symptoms, develop shortness of breath, life threatening emergency, suicidal or homicidal thoughts you must seek medical attention immediately by calling 911 or calling your MD immediately  if symptoms less severe.  You Must read complete instructions/literature along with all the possible adverse reactions/side effects for all the Medicines you take and that have been prescribed to you. Take any new Medicines after you have completely understood and accpet all the possible adverse reactions/side effects.       Person Under Monitoring Name: Leah Lawson  Location: [address] [place] [address]   Infection Prevention Recommendations for Individuals Confirmed to have, or Being Evaluated for, 2019 Novel Coronavirus (COVID-19) Infection Who Receive Care at Home  Individuals who are confirmed to have, or are being evaluated for, COVID-19 should follow the prevention steps below until a healthcare provider or local or state health department says they can return to normal activities.  Stay home except to get medical care You should restrict activities outside your home,  except for getting medical care. Do not go to work, school, or public areas, and do not use public transportation or taxis.  Call ahead before visiting your doctor Before your medical appointment, call the healthcare provider and tell them that you have, or are being evaluated for, COVID-19 infection. This will help the healthcare provider's office take steps to keep other people from getting infected. Ask your healthcare provider to call the local or state health department.  Monitor your symptoms Seek prompt medical attention if your illness is worsening (e.g., difficulty breathing). Before going to your medical appointment, call the healthcare provider and tell them that you have, or are being evaluated for, COVID-19 infection. Ask your healthcare provider to call the local or state health department.  Wear a facemask You should wear a facemask that covers your nose and mouth when you are in the same room with other people and when you visit a healthcare provider. People who live with or visit you should also wear a facemask while they are in the same room with you.  Separate yourself from other people in your home As much as possible, you should stay in a different room from other people in your home. Also, you should use a separate bathroom, if available.  Avoid sharing household items You should not share dishes, drinking glasses, cups, eating utensils, towels, bedding, or other items with other people in your home. After using these items, you should wash them thoroughly with soap and water.  Cover your coughs and sneezes Cover your mouth and nose with a tissue when you cough or sneeze, or you can  cough or sneeze into your sleeve. Throw used tissues in a lined trash can, and immediately wash your hands with soap and water for at least 20 seconds or use an alcohol-based hand rub.  Wash your [facility] your hands often and thoroughly with soap and water for at least 20 seconds.  You can use an alcohol-based hand sanitizer if soap and water are not available and if your hands are not visibly dirty. Avoid touching your eyes, nose, and mouth with unwashed hands.   Prevention Steps for Caregivers and Household Members of Individuals Confirmed to have, or Being Evaluated for, COVID-19 Infection Being Cared for in the Home  If you live with, or provide care at home for, a person confirmed to have, or being evaluated for, COVID-19 infection please follow these guidelines to prevent infection:  Follow healthcare provider's instructions Make sure that you understand and can help the patient follow any healthcare provider instructions for all care.  Provide for the patient's basic needs You should help the patient with basic needs in the home and provide support for getting groceries, prescriptions, and other personal needs.  Monitor the patient's symptoms If they are getting sicker, call his or her medical provider and tell them that the patient has, or is being evaluated for, COVID-19 infection. This will help the healthcare provider's office take steps to keep other people from getting infected. Ask the healthcare provider to call the local or state health department.  Limit the number of people who have contact with the patient  If possible, have only one caregiver for the patient.  Other household members should stay in another home or place of residence. If this is not possible, they should stay  in another room, or be separated from the patient as much as possible. Use a separate bathroom, if available.  Restrict visitors who do not have an essential need to be in the home.  Keep older adults, very young children, and other sick people away from the patient Keep older adults, very young children, and those who have compromised immune systems or chronic health conditions away from the patient. This includes people with chronic heart, lung, or kidney conditions,  diabetes, and cancer.  Ensure good ventilation Make sure that shared spaces in the home have good air flow, such as from an air conditioner or an opened window, weather permitting.  Wash your hands often  Wash your hands often and thoroughly with soap and water for at least 20 seconds. You can use an alcohol based hand sanitizer if soap and water are not available and if your hands are not visibly dirty.  Avoid touching your eyes, nose, and mouth with unwashed hands.  Use disposable paper towels to dry your hands. If not available, use dedicated cloth towels and replace them when they become wet.  Wear a facemask and gloves  Wear a disposable facemask at all times in the room and gloves when you touch or have contact with the patient's blood, body fluids, and/or secretions or excretions, such as sweat, saliva, sputum, nasal mucus, vomit, urine, or feces.  Ensure the mask fits over your nose and mouth tightly, and do not touch it during use.  Throw out disposable facemasks and gloves after using them. Do not reuse.  Wash your hands immediately after removing your facemask and gloves.  If your personal clothing becomes contaminated, carefully remove clothing and launder. Wash your hands after handling contaminated clothing.  Place all used disposable facemasks, gloves, and  other waste in a lined container before disposing them with other household waste.  Remove gloves and wash your hands immediately after handling these items.  Do not share dishes, glasses, or other household items with the patient  Avoid sharing household items. You should not share dishes, drinking glasses, cups, eating utensils, towels, bedding, or other items with a patient who is confirmed to have, or being evaluated for, COVID-19 infection.  After the person uses these items, you should wash them thoroughly with soap and water.  Wash laundry thoroughly  Immediately remove and wash clothes or bedding that have  blood, body fluids, and/or secretions or excretions, such as sweat, saliva, sputum, nasal mucus, vomit, urine, or feces, on them.  Wear gloves when handling laundry from the patient.  Read and follow directions on labels of laundry or clothing items and detergent. In general, wash and dry with the warmest temperatures recommended on the label.  Clean all areas the individual has used often  Clean all touchable surfaces, such as counters, tabletops, doorknobs, bathroom fixtures, toilets, phones, keyboards, tablets, and bedside tables, every day. Also, clean any surfaces that may have blood, body fluids, and/or secretions or excretions on them.  Wear gloves when cleaning surfaces the patient has come in contact with.  Use a diluted bleach solution (e.g., dilute bleach with 1 part bleach and 10 parts water) or a household disinfectant with a label that says EPA-registered for coronaviruses. To make a bleach solution at home, add 1 tablespoon of bleach to 1 quart (4 cups) of water. For a larger supply, add  cup of bleach to 1 gallon (16 cups) of water.  Read labels of cleaning products and follow recommendations provided on product labels. Labels contain instructions for safe and effective use of the cleaning product including precautions you should take when applying the product, such as wearing gloves or eye protection and making sure you have good ventilation during use of the product.  Remove gloves and wash hands immediately after cleaning.  Monitor yourself for signs and symptoms of illness Caregivers and household members are considered close contacts, should monitor their health, and will be asked to limit movement outside of the home to the extent possible. Follow the monitoring steps for close contacts listed on the symptom monitoring form.   ? If you have additional questions, contact your local health department or call the epidemiologist on call at [phone] (available 24/7). ?  This guidance is subject to change. For the most up-to-date guidance from [facility], please refer to their website: YouBlogs.pl

## 2019-11-30 NOTE — Care Management (Addendum)
Pt deemed stable for discharge home today. [facility] accepted pt for charity oxygen.  Pt will need to remain in [facility] facility until home equipment is confirmed to be delivered as pt can not receive a POC with charity.   Update:  Medication reconciliation complete - CM signing off

## 2020-03-13 ENCOUNTER — Encounter (HOSPITAL_COMMUNITY): Payer: Self-pay

## 2020-03-13 ENCOUNTER — Other Ambulatory Visit: Payer: Self-pay

## 2020-03-13 ENCOUNTER — Ambulatory Visit (HOSPITAL_COMMUNITY)
Admission: EM | Admit: 2020-03-13 | Discharge: 2020-03-13 | Disposition: A | Discharge status: Home / Self Care | Payer: Medicare Other | Attending: Family Medicine | Admitting: Family Medicine

## 2020-03-13 DIAGNOSIS — B9689 Other specified bacterial agents as the cause of diseases classified elsewhere: Secondary | ICD-10-CM | POA: Insufficient documentation

## 2020-03-13 DIAGNOSIS — J209 Acute bronchitis, unspecified: Secondary | ICD-10-CM | POA: Insufficient documentation

## 2020-03-13 DIAGNOSIS — Z8701 Personal history of pneumonia (recurrent): Secondary | ICD-10-CM | POA: Insufficient documentation

## 2020-03-13 DIAGNOSIS — I10 Essential (primary) hypertension: Secondary | ICD-10-CM | POA: Insufficient documentation

## 2020-03-13 DIAGNOSIS — Z7952 Long term (current) use of systemic steroids: Secondary | ICD-10-CM | POA: Diagnosis not present

## 2020-03-13 DIAGNOSIS — E119 Type 2 diabetes mellitus without complications: Secondary | ICD-10-CM | POA: Insufficient documentation

## 2020-03-13 DIAGNOSIS — Z8616 Personal history of COVID-19: Secondary | ICD-10-CM | POA: Insufficient documentation

## 2020-03-13 DIAGNOSIS — Z888 Allergy status to other drugs, medicaments and biological substances status: Secondary | ICD-10-CM | POA: Diagnosis not present

## 2020-03-13 DIAGNOSIS — Z6841 Body Mass Index (BMI) 40.0 and over, adult: Secondary | ICD-10-CM | POA: Diagnosis not present

## 2020-03-13 DIAGNOSIS — Z20822 Contact with and (suspected) exposure to covid-19: Secondary | ICD-10-CM | POA: Insufficient documentation

## 2020-03-13 DIAGNOSIS — H9203 Otalgia, bilateral: Secondary | ICD-10-CM | POA: Insufficient documentation

## 2020-03-13 DIAGNOSIS — Z87891 Personal history of nicotine dependence: Secondary | ICD-10-CM | POA: Diagnosis not present

## 2020-03-13 DIAGNOSIS — J449 Chronic obstructive pulmonary disease, unspecified: Secondary | ICD-10-CM | POA: Diagnosis not present

## 2020-03-13 DIAGNOSIS — J028 Acute pharyngitis due to other specified organisms: Secondary | ICD-10-CM | POA: Insufficient documentation

## 2020-03-13 DIAGNOSIS — Z7984 Long term (current) use of oral hypoglycemic drugs: Secondary | ICD-10-CM | POA: Insufficient documentation

## 2020-03-13 DIAGNOSIS — Z79899 Other long term (current) drug therapy: Secondary | ICD-10-CM | POA: Insufficient documentation

## 2020-03-13 MED ORDER — PREDNISONE 20 MG PO TABS
20.0000 mg | ORAL_TABLET | Freq: Every day | ORAL | 0 refills | Status: AC
Start: 2020-03-13 — End: 2020-03-16

## 2020-03-13 MED ORDER — BENZONATATE 100 MG PO CAPS: 100.0000 mg | ORAL_CAPSULE | Freq: Three times a day (TID) | ORAL | 0 refills | Status: DC | PRN

## 2020-03-13 MED ORDER — AMOXICILLIN-POT CLAVULANATE 875-125 MG PO TABS
1.0000 | ORAL_TABLET | Freq: Two times a day (BID) | ORAL | 0 refills | Status: DC
Start: 2020-03-13 — End: 2020-11-21

## 2020-03-13 NOTE — Discharge Instructions (Addendum)
[facility]  [address], [place], [place] [address]. [phone]

## 2020-03-13 NOTE — ED Provider Notes (Signed)
Mor[facility]    CSN: [id number] Arrival date & time: 03/13/20  1554      History   Chief Complaint Chief Complaint  Patient presents with  . Sore Throat    HPI Crystall Hebb is a 64 y.o. female.   HPI  Patient presents with URI symptoms concerning for a infection with COVID-19. Symptoms include: Cough (occasionally productive), sore throat, and dysphagia x 2 days. Afebrile present and no known exposure to any one with a confirmed positive COVID-19 test. Taking otc medication for symptoms. She has a prior of COVID-19 and is currently fully vaccinated.  Past Medical History:  Diagnosis Date  . Asthma   . COPD (chronic obstructive pulmonary disease) ([place])   . Diabetes mellitus without complication ([place])    "pre-diabetic"  . Hypertension     Patient Active Problem List   Diagnosis Date Noted  . Pneumonia due to COVID-19 virus 11/25/2019  . Obesity, Class III, BMI 40-49.9 (morbid obesity) ([place]) 11/25/2019  . Tobacco abuse 05/08/2018  . COPD exacerbation ([place]) 05/08/2018  . Acute on chronic respiratory failure with hypoxia ([place]) 05/08/2018  . Hypertension   . Diabetes mellitus without complication [facility])     Past Surgical History:  Procedure Laterality Date  . BREAST SURGERY      OB History   No obstetric history on file.      Home Medications    Prior to Admission medications   Medication Sig Start Date End Date Taking? Authorizing Provider  albuterol (PROVENTIL HFA;VENTOLIN HFA) 108 (90 Base) MCG/ACT inhaler Inhale 1 puff into the lungs every 6 (six) hours as needed for wheezing or shortness of breath.    [provider]  amLODipine (NORVASC) 5 MG tablet Take 5 mg by mouth daily. 10/01/19   [provider]  hydrochlorothiazide (HYDRODIURIL) 25 MG tablet Take 25 mg by mouth daily. 04/08/18   [provider]  ipratropium-albuterol (DUONEB) 0.5-2.5 (3) MG/3ML SOLN Use twice a day scheduled and every 4 hours as needed for shortness  of breath and wheezing 05/10/18   Thurnell Lose, MD  metFORMIN (GLUCOPHAGE) 500 MG tablet Take 500 mg by mouth every evening. 10/13/19   [provider]  Multiple Vitamins-Minerals (MULTIVITAMIN WITH MINERALS) tablet Take 1 tablet by mouth daily.    [provider]  naproxen sodium (ALEVE) 220 MG tablet Take 220 mg by mouth daily as needed (pain).    [provider]  Phenyleph-Doxylamine-DM-APAP (ALKA-SELTZER PLS NIGHT CLD/FLU PO) Take 2 tablets by mouth as needed (cold syptoms).    [provider]    Family History History reviewed. No pertinent family history.  Social History Social History   Tobacco Use  . Smoking status: Former Smoker    Packs/day: 0.50  . Smokeless tobacco: Never Used  Substance Use Topics  . Alcohol use: Not Currently  . Drug use: Never     Allergies   Mucinex [guaifenesin er] and Singulair [montelukast sodium] Review of Systems Review of Systems Pertinent negatives listed in HPI  Physical Exam Triage Vital Signs ED Triage Vitals  Enc Vitals Group     BP 03/13/20 1814 137/76     Pulse Rate 03/13/20 1814 97     Resp 03/13/20 1814 18     Temp 03/13/20 1814 98.8 F (37.1 C)     Temp Source 03/13/20 1814 Oral     SpO2 03/13/20 1814 97 %     Weight 03/13/20 1815 240 lb (108.9 kg)  Height 03/13/20 1815 5' 1.5" (1.562 m)     Head Circumference --      Peak Flow --      Pain Score 03/13/20 1814 8     Pain Loc --      Pain Edu? --      Excl. in [place]? --    No data found.  Updated Vital Signs BP 137/76   Pulse 97   Temp 98.8 F (37.1 C) (Oral)   Resp 18   Ht 5' 1.5" (1.562 m)   Wt 240 lb (108.9 kg)   SpO2 97%   BMI 44.61 kg/m   Visual Acuity Right Eye Distance:   Left Eye Distance:   Bilateral Distance:    Right Eye Near:   Left Eye Near:    Bilateral Near:     Physical Exam General Appearance:    Alert, cooperative, no distress  HENT:   bilateral TM normal without fluid or infection, throat  normal without erythema or exudate and nasal mucosa congested  Eyes:    PERRL, conjunctiva/corneas clear, EOM's intact       Lungs:     Clear to auscultation bilaterally, respirations unlabored  Heart:    Regular rate and rhythm  Neurologic:   Awake, alert, oriented x 3. No apparent focal neurological           defect.        UC Treatments / Results  Labs (all labs ordered are listed, but only abnormal results are displayed) Labs Reviewed  SARS CORONAVIRUS 2 (TAT 6-24 HRS)    EKG   Radiology No results found.  Procedures Procedures (including critical care time)  Medications Ordered in UC Medications - No data to display  Initial Impression / Assessment and Plan / UC Course  I have reviewed the triage vital signs and the nursing notes.  Pertinent labs & imaging results that were available during my care of the patient were reviewed by me and considered in my medical decision making (see chart for details).    Treating for acute bronchitis , pharyngitis, and bilateral ears. No fever, minimal risk for COVID-19 given recent infection and recent immunization. See medications orders:  An After Visit Summary was printed and given to the patient/family. Precautions discussed. Red flags discussed. Questions invited and answered. They voiced understanding and agreement.  Final Clinical Impressions(s) / UC Diagnoses   Final diagnoses:  Bacterial pharyngitis  Otalgia of both ears  Acute bronchitis, unspecified organism     Discharge Instructions     [facility]  [address], [place],  [address]. [phone]    ED Prescriptions    Medication Sig Dispense Auth. Provider   amoxicillin-clavulanate (AUGMENTIN) 875-125 MG tablet Take 1 tablet by mouth 2 (two) times daily. 20 tablet Scot Jun, FNP   benzonatate (TESSALON) 100 MG capsule Take 1-2 capsules (100-200 mg total) by mouth 3 (three) times daily as needed for cough. 40 capsule Scot Jun, FNP   predniSONE (DELTASONE) 20 MG tablet Take 1 tablet (20 mg total) by mouth daily with breakfast for 3 days. 3 tablet Scot Jun, FNP     PDMP not reviewed this encounter.   Scot Jun, [place] 03/16/20 2336

## 2020-03-13 NOTE — ED Triage Notes (Signed)
Pt c/o sore throat, productive cough w/yellow or white phlegmx2 days. Pt denies dysphagia. Pt states has ear pain bilat and states her feet are swollenx2 days. Pt has 2+ edema of feet bilat.

## 2020-03-14 LAB — SARS CORONAVIRUS 2 (TAT 6-24 HRS): SARS Coronavirus 2: NEGATIVE

## 2020-05-15 ENCOUNTER — Other Ambulatory Visit: Payer: Self-pay | Admitting: Nurse Practitioner

## 2020-05-18 ENCOUNTER — Other Ambulatory Visit: Payer: Self-pay | Admitting: Nurse Practitioner

## 2020-05-18 DIAGNOSIS — R911 Solitary pulmonary nodule: Secondary | ICD-10-CM

## 2020-06-05 ENCOUNTER — Ambulatory Visit
Admission: RE | Admit: 2020-06-05 | Discharge: 2020-06-05 | Disposition: A | Discharge status: Home / Self Care | Payer: Medicare Other | Source: Ambulatory Visit | Attending: Nurse Practitioner | Admitting: Nurse Practitioner

## 2020-06-05 DIAGNOSIS — R911 Solitary pulmonary nodule: Secondary | ICD-10-CM

## 2020-07-05 ENCOUNTER — Other Ambulatory Visit: Payer: Self-pay | Admitting: Nurse Practitioner

## 2020-07-05 DIAGNOSIS — E041 Nontoxic single thyroid nodule: Secondary | ICD-10-CM

## 2020-07-24 ENCOUNTER — Ambulatory Visit
Admission: RE | Admit: 2020-07-24 | Discharge: 2020-07-24 | Disposition: A | Discharge status: Home / Self Care | Payer: Medicare Other | Source: Ambulatory Visit | Attending: Nurse Practitioner | Admitting: Nurse Practitioner

## 2020-07-24 DIAGNOSIS — E041 Nontoxic single thyroid nodule: Secondary | ICD-10-CM

## 2020-11-21 ENCOUNTER — Other Ambulatory Visit: Payer: Self-pay

## 2020-11-21 ENCOUNTER — Encounter: Payer: Self-pay | Admitting: Pulmonary Disease

## 2020-11-21 ENCOUNTER — Ambulatory Visit: Payer: Medicare Other | Admitting: Pulmonary Disease

## 2020-11-21 VITALS — BP 144/98 | HR 78 | Temp 98.1°F | Ht 61.5 in | Wt 241.6 lb

## 2020-11-21 DIAGNOSIS — J441 Chronic obstructive pulmonary disease with (acute) exacerbation: Secondary | ICD-10-CM | POA: Diagnosis not present

## 2020-11-21 DIAGNOSIS — J9621 Acute and chronic respiratory failure with hypoxia: Secondary | ICD-10-CM

## 2020-11-21 MED ORDER — ALBUTEROL SULFATE HFA 108 (90 BASE) MCG/ACT IN AERS
2.0000 | INHALATION_SPRAY | Freq: Four times a day (QID) | RESPIRATORY_TRACT | 6 refills | Status: DC | PRN
Start: 2020-11-21 — End: 2022-03-18

## 2020-11-21 NOTE — Assessment & Plan Note (Signed)
She denies choking or gasping episodes in her sleep or witnessed apneas.  Snoring is present. If lung function is normal we may investigate for sleep disordered breathing

## 2020-11-21 NOTE — Patient Instructions (Addendum)
Schedule  PFTs Rx for albuterol MDI to use as needed

## 2020-11-21 NOTE — Progress Notes (Signed)
Subjective:    Patient ID: Leah Lawson, female    DOB: 05-Oct-1955, 65 y.o.   MRN: [id number]  HPI   Chief Complaint  Patient presents with  . Consult    Intermittent productive cough with white phlegm, shortness of breath with activity since last April 5570   65 year old ex-smoker presents for evaluation of shortness of breath and wheezing. She carries a diagnosis of COPD.  She smoked about a pack per day for 40 years until she quit in 2019.  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 She reports asthma and bronchitis episodes about twice a year as a young adult She had COVID infection 11/2019 and was hospitalized, since then she reports increased shortness of breath and intermittent wheezing.  She also reports a dry cough, very occasionally productive of minimal white phlegm She denies frequent chest colds, she denies nocturnal wheezing. She was maintained on Symbicort for many years, there was a.  She was off medications and now she is back on [place].  She does feel that medication helps somewhat. She also has albuterol MDI which she is not using and a nebulizer with DuoNeb prescription which she has not had to use.  She denies ED visits or hospitalizations      Significant tests/ events reviewed  CT chest WO con 06/2020 emphysema , thyroid nodule   Past Medical History:  Diagnosis Date  . Asthma   . COPD (chronic obstructive pulmonary disease) ([place])   . Diabetes mellitus without complication ([place])    "pre-diabetic"  . Hypertension    Past Surgical History:  Procedure Laterality Date  . BREAST SURGERY      Allergies  Allergen Reactions  . Mucinex [Guaifenesin Er] Diarrhea  . Singulair [Montelukast Sodium] Other (See Comments)    headaches    Social History   Socioeconomic History  . Marital status: Single    Spouse name: Not on file  . Number of  children: Not on file  . Years of education: Not on file  . Highest education level: Not on file  Occupational History  . Not on file  Tobacco Use  . Smoking status: Former Smoker    Packs/day: 0.50    Years: 45.00    Pack years: 22.50    Types: Cigarettes    Quit date: 05/05/2018    Years since quitting: 2.5  . Smokeless tobacco: Never Used  Vaping Use  . Vaping Use: Never used  Substance and Sexual Activity  . Alcohol use: Not Currently  . Drug use: Never  . Sexual activity: Not on file  Other Topics Concern  . Not on file  Social History Narrative  . Not on file   Social Determinants of Health   Financial Resource Strain: Not on file  Food Insecurity: Not on file  Transportation Needs: Not on file  Physical Activity: Not on file  Stress: Not on file  Social Connections: Not on file  Intimate Partner Violence: Not on file     History reviewed. No pertinent family history. Mom- lung cancer, obesity, on O2    Review of Systems Constitutional: negative for anorexia, fevers and sweats  Eyes: negative for irritation, redness and visual disturbance  Ears, nose, mouth, throat, and face: negative for earaches, epistaxis, nasal congestion and sore throat   Cardiovascular: negative for chest pain,  orthopnea, palpitations and syncope  Gastrointestinal: negative for abdominal pain, constipation,  diarrhea, melena, nausea and vomiting  Genitourinary:negative for dysuria, frequency and hematuria  Hematologic/lymphatic: negative for bleeding, easy bruising and lymphadenopathy  Musculoskeletal:negative for arthralgias, muscle weakness and stiff joints  Neurological: negative for coordination problems, gait problems, headaches and weakness  Endocrine: negative for diabetic symptoms including polydipsia, polyuria and weight loss     Objective:   Physical Exam  Gen. Pleasant, obese, in no distress, normal affect ENT - no pallor,icterus, no post nasal drip, class 2-3  airway Neck: No JVD, no thyromegaly, no carotid bruits Lungs: no use of accessory muscles, no dullness to percussion, decreased without rales or rhonchi  Cardiovascular: Rhythm regular, heart sounds  normal, no murmurs or gallops, no peripheral edema Abdomen: soft and non-tender, no hepatosplenomegaly, BS normal. Musculoskeletal: No deformities, no cyanosis or clubbing Neuro:  alert, non focal, no tremors       Assessment & Plan:

## 2020-11-21 NOTE — Assessment & Plan Note (Addendum)
She does carry a diagnosis of COPD.  We will schedule PFTs to confirm. Not sure what is making her breathing worse.  She has quit smoking 2 years ago If PFTs show poor lung function, we will step up from [facility] to triple therapy If on the other hand lung function is preserved, then we will have to investigate other etiologies including cardiac and deconditioning We will also enroll her in lung cancer screening program with her first low-dose CT planned for November 2022

## 2020-11-22 ENCOUNTER — Other Ambulatory Visit (HOSPITAL_COMMUNITY)
Admission: RE | Admit: 2020-11-22 | Discharge: 2020-11-22 | Disposition: A | Discharge status: Home / Self Care | Payer: Medicare Other | Source: Ambulatory Visit | Attending: Pulmonary Disease | Admitting: Pulmonary Disease

## 2020-11-22 DIAGNOSIS — Z20822 Contact with and (suspected) exposure to covid-19: Secondary | ICD-10-CM | POA: Insufficient documentation

## 2020-11-22 DIAGNOSIS — Z01812 Encounter for preprocedural laboratory examination: Secondary | ICD-10-CM | POA: Insufficient documentation

## 2020-11-22 LAB — SARS CORONAVIRUS 2 (TAT 6-24 HRS): SARS Coronavirus 2: NEGATIVE

## 2020-11-24 ENCOUNTER — Other Ambulatory Visit: Payer: Self-pay

## 2020-11-24 ENCOUNTER — Ambulatory Visit (INDEPENDENT_AMBULATORY_CARE_PROVIDER_SITE_OTHER): Payer: Medicare Other | Admitting: Pulmonary Disease

## 2020-11-24 DIAGNOSIS — J9621 Acute and chronic respiratory failure with hypoxia: Secondary | ICD-10-CM | POA: Diagnosis not present

## 2020-11-24 LAB — PULMONARY FUNCTION TEST
DL/VA % pred: 58 %
DL/VA: 2.48 ml/min/mmHg/L
DLCO cor % pred: 53 %
DLCO cor: 9.73 ml/min/mmHg
DLCO unc % pred: 53 %
DLCO unc: 9.73 ml/min/mmHg
FEF 25-75 Post: 0.53 L/sec
FEF 25-75 Pre: 0.44 L/sec
FEF2575-%Change-Post: 20 %
FEF2575-%Pred-Post: 30 %
FEF2575-%Pred-Pre: 25 %
FEV1-%Change-Post: 3 %
FEV1-%Pred-Post: 70 %
FEV1-%Pred-Pre: 68 %
FEV1-Post: 1.23 L
FEV1-Pre: 1.19 L
FEV1FVC-%Change-Post: 4 %
FEV1FVC-%Pred-Pre: 72 %
FEV6-%Change-Post: 2 %
FEV6-%Pred-Post: 93 %
FEV6-%Pred-Pre: 90 %
FEV6-Post: 2 L
FEV6-Pre: 1.96 L
FEV6FVC-%Change-Post: 3 %
FEV6FVC-%Pred-Post: 101 %
FEV6FVC-%Pred-Pre: 97 %
FVC-%Change-Post: -1 %
FVC-%Pred-Post: 91 %
FVC-%Pred-Pre: 92 %
FVC-Post: 2.05 L
FVC-Pre: 2.08 L
Post FEV1/FVC ratio: 60 %
Post FEV6/FVC ratio: 98 %
Pre FEV1/FVC ratio: 57 %
Pre FEV6/FVC Ratio: 94 %
RV % pred: 153 %
RV: 3 L
TLC % pred: 109 %
TLC: 5.12 L

## 2020-11-24 NOTE — Progress Notes (Signed)
Full PFT completed today ? ?

## 2020-12-12 NOTE — Progress Notes (Signed)
Called and left message on voicemail to please return phone call to go over PFT results. Contact number provided. Letter sent to please call to go over result also due to multiple attempts to reach patient.

## 2020-12-13 ENCOUNTER — Telehealth: Payer: Self-pay | Admitting: Pulmonary Disease

## 2020-12-13 NOTE — Progress Notes (Signed)
Went over PFT results with patient. See telephone note from today 12/13/2020.

## 2020-12-13 NOTE — Telephone Encounter (Signed)
Called and spoke with patient to go over PFT results per Dr Elsworth Soho.    Rigoberto Noel, MD  11/29/2020 1:29 PM EDT      PFTs confirms moderate COPD, lung capacity is at 70%. If she is still short of breath after taking Dulera, suggest that we increase to triple therapy, either Breztri 2 puffs twice daily or Trelegy 1 puff once daily whichever is covered by her insurance   Went over PFT results per Dr Elsworth Soho with patient and patient expressed full understanding of results. Patient stated that the Ruthe Mannan is working well for her and does not feel another inhaler therapy is needed at this time. Advised patient to please feel free to call office back if she feels she needs more or anything else. Patient expressed understanding. Nothing further needed at this time.

## 2020-12-13 NOTE — Telephone Encounter (Signed)
Called and left message on voicemail to please return phone call. Contact number provided. 

## 2021-04-19 ENCOUNTER — Other Ambulatory Visit: Payer: Self-pay | Admitting: Family Medicine

## 2021-04-19 DIAGNOSIS — Z780 Asymptomatic menopausal state: Secondary | ICD-10-CM

## 2021-04-19 DIAGNOSIS — Z1231 Encounter for screening mammogram for malignant neoplasm of breast: Secondary | ICD-10-CM

## 2021-05-25 ENCOUNTER — Ambulatory Visit
Admission: RE | Admit: 2021-05-25 | Discharge: 2021-05-25 | Disposition: A | Discharge status: Home / Self Care | Payer: Medicare Other | Source: Ambulatory Visit | Attending: Family Medicine | Admitting: Family Medicine

## 2021-05-25 ENCOUNTER — Other Ambulatory Visit: Payer: Self-pay

## 2021-05-25 DIAGNOSIS — Z1231 Encounter for screening mammogram for malignant neoplasm of breast: Secondary | ICD-10-CM

## 2021-10-03 ENCOUNTER — Ambulatory Visit
Admission: RE | Admit: 2021-10-03 | Discharge: 2021-10-03 | Disposition: A | Discharge status: Home / Self Care | Payer: Medicare Other | Source: Ambulatory Visit | Attending: Family Medicine | Admitting: Family Medicine

## 2021-10-03 DIAGNOSIS — Z780 Asymptomatic menopausal state: Secondary | ICD-10-CM

## 2021-10-18 ENCOUNTER — Encounter: Payer: Self-pay | Admitting: Family Medicine

## 2021-10-18 ENCOUNTER — Ambulatory Visit (INDEPENDENT_AMBULATORY_CARE_PROVIDER_SITE_OTHER): Payer: Medicare Other | Admitting: Family Medicine

## 2021-10-18 ENCOUNTER — Other Ambulatory Visit: Payer: Self-pay

## 2021-10-18 ENCOUNTER — Other Ambulatory Visit: Payer: Self-pay | Admitting: Family Medicine

## 2021-10-18 ENCOUNTER — Ambulatory Visit (INDEPENDENT_AMBULATORY_CARE_PROVIDER_SITE_OTHER): Payer: Medicare Other

## 2021-10-18 VITALS — BP 144/92 | HR 81 | Temp 98.3°F | Resp 16 | Ht 61.0 in | Wt 250.8 lb

## 2021-10-18 DIAGNOSIS — E042 Nontoxic multinodular goiter: Secondary | ICD-10-CM | POA: Insufficient documentation

## 2021-10-18 DIAGNOSIS — E559 Vitamin D deficiency, unspecified: Secondary | ICD-10-CM

## 2021-10-18 DIAGNOSIS — I10 Essential (primary) hypertension: Secondary | ICD-10-CM | POA: Diagnosis not present

## 2021-10-18 DIAGNOSIS — J439 Emphysema, unspecified: Secondary | ICD-10-CM | POA: Insufficient documentation

## 2021-10-18 DIAGNOSIS — E119 Type 2 diabetes mellitus without complications: Secondary | ICD-10-CM

## 2021-10-18 DIAGNOSIS — M8580 Other specified disorders of bone density and structure, unspecified site: Secondary | ICD-10-CM | POA: Diagnosis not present

## 2021-10-18 DIAGNOSIS — J4489 Other specified chronic obstructive pulmonary disease: Secondary | ICD-10-CM | POA: Insufficient documentation

## 2021-10-18 DIAGNOSIS — I700 Atherosclerosis of aorta: Secondary | ICD-10-CM

## 2021-10-18 LAB — COMPREHENSIVE METABOLIC PANEL
ALT: 9 U/L (ref 0–35)
AST: 14 U/L (ref 0–37)
Albumin: 4.2 g/dL (ref 3.5–5.2)
Alkaline Phosphatase: 87 U/L (ref 39–117)
BUN: 17 mg/dL (ref 6–23)
CO2: 33 mEq/L — ABNORMAL HIGH (ref 19–32)
Calcium: 9.3 mg/dL (ref 8.4–10.5)
Chloride: 100 mEq/L (ref 96–112)
Creatinine, Ser: 0.73 mg/dL (ref 0.40–1.20)
GFR: 85.58 mL/min (ref >60.00)
Glucose, Bld: 100 mg/dL — ABNORMAL HIGH (ref 70–99)
Potassium: 4 mEq/L (ref 3.5–5.1)
Sodium: 141 mEq/L (ref 135–145)
Total Bilirubin: 0.6 mg/dL (ref 0.2–1.2)
Total Protein: 7.3 g/dL (ref 6.0–8.3)

## 2021-10-18 LAB — CBC WITH DIFFERENTIAL/PLATELET
Basophils Absolute: 0 10*3/uL (ref 0.0–0.1)
Basophils Relative: 0.7 % (ref 0.0–3.0)
Eosinophils Absolute: 0.1 10*3/uL (ref 0.0–0.7)
Eosinophils Relative: 1.8 % (ref 0.0–5.0)
HCT: 48.8 % — ABNORMAL HIGH (ref 36.0–46.0)
Hemoglobin: 16.1 g/dL — ABNORMAL HIGH (ref 12.0–15.0)
Lymphocytes Relative: 25 % (ref 12.0–46.0)
Lymphs Abs: 1.5 10*3/uL (ref 0.7–4.0)
MCHC: 33 g/dL (ref 30.0–36.0)
MCV: 90.8 fl (ref 78.0–100.0)
Monocytes Absolute: 0.4 10*3/uL (ref 0.1–1.0)
Monocytes Relative: 7.6 % (ref 3.0–12.0)
Neutro Abs: 3.8 10*3/uL (ref 1.4–7.7)
Neutrophils Relative %: 64.9 % (ref 43.0–77.0)
Platelets: 237 10*3/uL (ref 150.0–400.0)
RBC: 5.37 Mil/uL — ABNORMAL HIGH (ref 3.87–5.11)
RDW: 14.1 % (ref 11.5–15.5)
WBC: 5.9 10*3/uL (ref 4.0–10.5)

## 2021-10-18 LAB — LIPID PANEL
Cholesterol: 171 mg/dL (ref 0–200)
HDL: 42.4 mg/dL (ref >39.00)
LDL Cholesterol: 103 mg/dL — ABNORMAL HIGH (ref 0–99)
NonHDL: 128.38
Total CHOL/HDL Ratio: 4
Triglycerides: 125 mg/dL (ref 0.0–149.0)
VLDL: 25 mg/dL (ref 0.0–40.0)

## 2021-10-18 LAB — VITAMIN D 25 HYDROXY (VIT D DEFICIENCY, FRACTURES): VITD: 23.26 ng/mL — ABNORMAL LOW (ref 30.00–100.00)

## 2021-10-18 LAB — TSH: TSH: 1.12 u[IU]/mL (ref 0.35–5.50)

## 2021-10-18 LAB — T4, FREE: Free T4: 1.08 ng/dL (ref 0.60–1.60)

## 2021-10-18 LAB — HEMOGLOBIN A1C: Hgb A1c MFr Bld: 7.4 % — ABNORMAL HIGH (ref 4.6–6.5)

## 2021-10-18 MED ORDER — ROSUVASTATIN CALCIUM 5 MG PO TABS: 5.0000 mg | ORAL_TABLET | Freq: Every day | ORAL | 1 refills | Status: DC

## 2021-10-18 MED ORDER — VITAMIN D (ERGOCALCIFEROL) 1.25 MG (50000 UNIT) PO CAPS: 50000.0000 [IU] | ORAL_CAPSULE | ORAL | 0 refills | Status: DC

## 2021-10-18 MED ORDER — DAPAGLIFLOZIN PROPANEDIOL 5 MG PO TABS: 5.0000 mg | ORAL_TABLET | Freq: Every day | ORAL | 0 refills | Status: DC

## 2021-10-18 NOTE — Patient Instructions (Signed)
Go to lab and radiology downstairs before you leave.  ? ?You should hear from the Hypertension clinic and lung specialist soon.  ? ?Use your inhaler as needed and Duoneb.  ? ?We will be in touch with your results.  ?

## 2021-10-18 NOTE — Progress Notes (Signed)
? ?Subjective:  ? ? Patient ID: Leah Lawson, female    DOB: [birth year], 66 y.o.   MRN: [id number] ? ?HPI ?Chief Complaint  ?Patient presents with  ? Establish Care  ?  Would like to get her BP controlled and also would like to discuss her pre-diabetes and medication (check A1c). ? ?Also mentions bilateral thigh pain that keeps her up at night.  ? ?She is new to the practice and here to establish care.  ? ?Other providers: ?Pulmonologist  ?Eyes- Dr. Idolina Primer ? ?She has multiple concerns. Uncontrolled HTN, DM, COPD, weight gain.  ? ?HTN- reports taking losartan/HCTZ 100-25 daily. States she sopped clonidine 2 weeks ago because it was making her tired. Energy improved since stopping it.  ?Amlodipine causes leg swelling  ?Hydralazine - she did not like how she felt on it.  ?She does not want to start on a new HTN medication today. States she has tried many of them and they did not work or made her feel bad.  ? ?Diabetes- A1c 7.2% in 11/2019  ?States metformin caused her to have abdominal pain. Stopped it 2-3 months ago.  ? ??glaucoma but no retinopathy per patient.  ? ?Obesity- states she has an issue with losing weight, keeps gaining  ? ?Vitamin D def- is not on a supplement  ? ?COPD- emphysema seen on CT. States she has not been using albuterol, did not know to use it but does have one. She is using Duoneb. Has not seen her pulmonologist in several months.  ?Checks pulse ox at home and usually in low 90s, high 80s.  ?Feels short of breath often. Coughs up thick white mucus.  ?CT also showed ?early [place] ? ?Hx of pneumonia due to Covid in 2021  ? ?Smoker- stopped 4 years ago  ? ?Sleep apnea- never been tested  ? ?Thyroid nodules on [place] in 07/2020 and recommended annual follow up.  ? ?Social history: Lives alone, single with 4 kids, retired Pharmacist, hospital, substitute  ?Denies drinking alcohol, drug use  ? ? ?Health maintenance:   ?Mammogram: 10/22 neg ?DEXA: 10/2021 osteopenia T score -1.4 ? ? ?Reviewed allergies, medications, past  medical, surgical, family, and social history. ? ? ?Review of Systems ?Pertinent positives and negatives in the history of present illness. ? ?   ?Objective:  ? Physical Exam ?BP (!) 144/92 (BP Location: Left Arm, Patient Position: Sitting, Cuff Size: Large)   Pulse 81   Temp 98.3 ?F (36.8 ?C) (Oral)   Resp 16   Ht '5\' 1"'$  (1.549 m)   Wt 250 lb 12.8 oz (113.8 kg)   SpO2 93%   BMI 47.39 kg/m?  ? ?Alert and in no distress. Neck is supple without adenopathy or thyromegaly, unable to palpate nodules on exam. Cardiac exam shows a regular sinus rhythm without murmurs or gallops. Lungs are clear to auscultation. Extremities without edema. Skin is warm and dry. Normal mood.  ? ? ? ?   ?Assessment & Plan:  ?Uncontrolled hypertension - Plan: Ambulatory referral to [facility] [place], CBC with Differential/Platelet, Comprehensive metabolic panel, Comprehensive metabolic panel, CBC with Differential/Platelet ?-She is new to the practice and here to establish care.  Apparently her blood pressure has been uncontrolled for quite some time.  She has been on several different hypertensive medications and did not tolerate many of them.  She will continue losartan/HCTZ for now.  Referral to hypertension clinic for evaluation and further assistance with medication.  Counseled on low-sodium diet. ? ?  Diabetes mellitus without complication ([place]) - Plan: Comprehensive metabolic panel, Hemoglobin A1c, Lipid panel, Lipid panel, Hemoglobin A1c, Comprehensive metabolic panel ?-She was unaware that she had diabetes, thought she was in the prediabetic range.  A1c 7.2% in 2021.  Currently appears asymptomatic in regards to diabetes.  I will check her A1c and other labs and start her on medication as indicated. ? ?Osteopenia, unspecified location ?-Reviewed recent DEXA.  T score -1.4 ?Check vitamin D level and supplement as appropriate.  Recommend calcium in diet and weightbearing exercise. ? ?Multiple thyroid  nodules - Plan: T4, free, TSH, T3, T3, TSH, T4, free ?-Review of her chart showed thyroid ultrasound with nodules recommended for annual follow-up.  We discussed this and will move forward with ultrasound at her next visit.  Currently asymptomatic ? ?Aortic atherosclerosis ([facility]) - Plan: Lipid panel, Lipid panel ?-Discussed significance of atherosclerosis.  Check lipid panel and start on statin as indicated. ? ?Pulmonary emphysema, unspecified emphysema type ([place]) - Plan: Ambulatory referral to Pulmonology, DG Chest 2 View, CBC with Differential/Platelet, Comprehensive metabolic panel, Comprehensive metabolic panel, CBC with Differential/Platelet ?-No acute distress.  Counseling on using inhaler and nebulizer.  Referral back to her pulmonologist. ? ?Obesity, Class III, BMI 40-49.9 (morbid obesity) ([place]) - Plan: Lipid panel, Lipid panel ?-Recommend healthy diet and increasing physical activity as tolerated for weight loss.  We also discussed starting on diabetes medication that will also help with weight loss. ? ?Vitamin D deficiency - Plan: VITAMIN D 25 Hydroxy (Vit-D Deficiency, Fractures), VITAMIN D 25 Hydroxy (Vit-D Deficiency, Fractures) ?-Follow-up pending vitamin D level ? ? ?

## 2021-10-19 LAB — T3: T3, Total: 120 ng/dL (ref 76–181)

## 2021-10-26 ENCOUNTER — Encounter: Payer: Self-pay | Admitting: Family Medicine

## 2021-10-26 DIAGNOSIS — H4010X0 Unspecified open-angle glaucoma, stage unspecified: Secondary | ICD-10-CM

## 2021-10-26 DIAGNOSIS — H35039 Hypertensive retinopathy, unspecified eye: Secondary | ICD-10-CM

## 2021-10-26 HISTORY — DX: Unspecified open-angle glaucoma, stage unspecified: H40.10X0

## 2021-10-26 HISTORY — DX: Hypertensive retinopathy, unspecified eye: H35.039

## 2021-11-08 ENCOUNTER — Encounter: Payer: Self-pay | Admitting: Pulmonary Disease

## 2021-11-08 ENCOUNTER — Ambulatory Visit: Payer: Medicare Other | Admitting: Pulmonary Disease

## 2021-11-08 VITALS — BP 134/70 | HR 88 | Temp 98.6°F | Ht 61.0 in | Wt 250.0 lb

## 2021-11-08 DIAGNOSIS — J432 Centrilobular emphysema: Secondary | ICD-10-CM

## 2021-11-08 DIAGNOSIS — G4733 Obstructive sleep apnea (adult) (pediatric): Secondary | ICD-10-CM

## 2021-11-08 NOTE — Patient Instructions (Addendum)
?  X Sample & Rx for Anoro 1 puff daily INSTEAD of dulera ? ?X refer to pulm rehab ? ?X home sleep  test ?Epworth score ?

## 2021-11-08 NOTE — Assessment & Plan Note (Signed)
Switch to Anoro from [facility] ?Refer to pulmonary rehab ?

## 2021-11-08 NOTE — Progress Notes (Signed)
? ?  Subjective:  ? ? Patient ID: Leah Lawson, female    DOB: [birth year], 66 y.o.   MRN: [id number] ? ?HPI ? ?66 yo ex-smoker for follow-up of COPD.   ?She smoked about a pack per day for 40 years until she quit in 2019.  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 ?PMH -COVID infection 03/2021 ? ?Chief Complaint  ?Patient presents with  ? Follow-up  ?  Pt is here for overdue followup. Patient states that she gets sob when she walks long distances and going up stairs. But she states over all her breathing is ok.   ? ?She complains of dyspnea on exertion. ?Weight has increased by 10 pounds to 250 ?She has difficulty remembering to take the [facility] in the evening.  She reports congestion in the mornings, clear sputum. ?Blood pressure is controlled. ? ?Epworth Sleepiness Scale is 8 and she reports some sleepiness as a passenger in a car or sitting quietly after lunch. ?Bedtime is around 11:30 PM after [employer] show , sleeps on 1 pillow, denies nocturnal awakenings, occasional nocturia and is out of bed by 8 AM feeling tired without dryness of mouth or headaches. ?No bed partner history is available. ? ? ? ?Significant tests/ events reviewed ? ?PFTs 12/2020 moderate airway obstruction, ratio 57, FEV1 68%, FVC 92%, TLC 109%, DLCO 9.7/53% ? ?CT chest WO con 06/2020 emphysema , thyroid nodule ? ? ?Review of Systems ?neg for any significant sore throat, dysphagia, itching, sneezing, nasal congestion or excess/ purulent secretions, fever, chills, sweats, unintended wt loss, pleuritic or exertional cp, hempoptysis, orthopnea pnd or change in chronic leg swelling. Also denies presyncope, palpitations, heartburn, abdominal pain, nausea, vomiting, diarrhea or change in bowel or urinary habits, dysuria,hematuria, rash, arthralgias, visual complaints, headache, numbness weakness or ataxia. ? ?   ?Objective:  ? Physical Exam ? ?Gen. Pleasant, obese, in no distress ?ENT - no lesions, no  post nasal drip ?Neck: No JVD, no thyromegaly, no carotid bruits ?Lungs: no use of accessory muscles, no dullness to percussion, decreased without rales or rhonchi  ?Cardiovascular: Rhythm regular, heart sounds  normal, no murmurs or gallops, no peripheral edema ?Musculoskeletal: No deformities, no cyanosis or clubbing , no tremors ? ? ? ?   ?Assessment & Plan:  ? ?Possible OSA -new problem  ?she has gained weight, has nonrestorative sleep and hypersomnolence in the daytime.  No bed partner history is available today. ?We will proceed with a home sleep test to investigate ? ? ?The pathophysiology of obstructive sleep apnea , it's cardiovascular consequences & modes of treatment including CPAP were discused with the patient in detail & they evidenced understanding. ? ? ?

## 2021-11-15 ENCOUNTER — Ambulatory Visit: Payer: Medicare Other | Admitting: Family Medicine

## 2021-11-15 ENCOUNTER — Encounter: Payer: Self-pay | Admitting: Family Medicine

## 2021-11-15 VITALS — BP 142/86 | HR 85 | Temp 98.2°F | Ht 61.0 in | Wt 249.0 lb

## 2021-11-15 DIAGNOSIS — H35039 Hypertensive retinopathy, unspecified eye: Secondary | ICD-10-CM | POA: Diagnosis not present

## 2021-11-15 DIAGNOSIS — E559 Vitamin D deficiency, unspecified: Secondary | ICD-10-CM

## 2021-11-15 DIAGNOSIS — I700 Atherosclerosis of aorta: Secondary | ICD-10-CM

## 2021-11-15 DIAGNOSIS — E669 Obesity, unspecified: Secondary | ICD-10-CM

## 2021-11-15 DIAGNOSIS — I10 Essential (primary) hypertension: Secondary | ICD-10-CM | POA: Diagnosis not present

## 2021-11-15 DIAGNOSIS — E042 Nontoxic multinodular goiter: Secondary | ICD-10-CM

## 2021-11-15 DIAGNOSIS — E1169 Type 2 diabetes mellitus with other specified complication: Secondary | ICD-10-CM

## 2021-11-15 DIAGNOSIS — Z5320 Procedure and treatment not carried out because of patient's decision for unspecified reasons: Secondary | ICD-10-CM

## 2021-11-15 NOTE — Patient Instructions (Addendum)
Dermatology offices ? ?[facility]: Phone #: [phone] ?Address: [address] [place], [place] [address] ? ?[facility]: Phone: [phone] ? Address: [address] [place], [place] [address] ? ?[facility]: [phone] ?Address: [address] ?[place], [place] [address] ? ?[facility] ?Address: [place], [place], [place] [address] ?Phone: [phone]  ? ? ?Diabetes Mellitus and Nutrition, Adult ?When you have diabetes, or diabetes mellitus, it is very important to have healthy eating habits because your blood sugar (glucose) levels are greatly affected by what you eat and drink. Eating healthy foods in the right amounts, at about the same times every day, can help you: ?Manage your blood glucose. ?Lower your risk of heart disease. ?Improve your blood pressure. ?Reach or maintain a healthy weight. ?What can affect my meal plan? ?Every person with diabetes is different, and each person has different needs for a meal plan. Your health care provider may recommend that you work with a dietitian to make a meal plan that is best for you. Your meal plan may vary depending on factors such as: ?The calories you need. ?The medicines you take. ?Your weight. ?Your blood glucose, blood pressure, and cholesterol levels. ?Your activity level. ?Other health conditions you have, such as heart or kidney disease. ?How do carbohydrates affect me? ?Carbohydrates, also called carbs, affect your blood glucose level more than any other type of food. Eating carbs raises the amount of glucose in your blood. ?It is important to know how many carbs you can safely have in each meal. This is different for every person. Your dietitian can help you calculate how many carbs you should have at each meal and for each snack. ?How does alcohol affect me? ?Alcohol can cause a decrease in blood glucose (hypoglycemia), especially if you use insulin or take certain diabetes medicines by  mouth. Hypoglycemia can be a life-threatening condition. Symptoms of hypoglycemia, such as sleepiness, dizziness, and confusion, are similar to symptoms of having too much alcohol. ?Do not drink alcohol if: ?Your health care provider tells you not to drink. ?You are pregnant, may be pregnant, or are planning to become pregnant. ?If you drink alcohol: ?Limit how much you have to: ?0-1 drink a day for women. ?0-2 drinks a day for men. ?Know how much alcohol is in your drink. In the [place] one drink equals one 12 oz bottle of beer (355 mL), one 5 oz glass of wine (148 mL), or one 1? oz glass of hard liquor (44 mL). ?Keep yourself hydrated with water, diet soda, or unsweetened iced tea. Keep in mind that regular soda, juice, and other mixers may contain a lot of sugar and must be counted as carbs. ?What are tips for following this plan? ?Reading food labels ?Start by checking the serving size on the Nutrition Facts label of packaged foods and drinks. The number of calories and the amount of carbs, fats, and other nutrients listed on the label are based on one serving of the item. Many items contain more than one serving per package. ?Check the total grams (g) of carbs in one serving. ?Check the number of grams of saturated fats and trans fats in one serving. Choose foods that have a low amount or none of these fats. ?Check the number of milligrams (mg) of salt (sodium) in one serving. Most people should limit total sodium intake to less than 2,300 mg per day. ?Always check the nutrition information of foods labeled as "low-fat" or "nonfat." These foods may  be higher in added sugar or refined carbs and should be avoided. ?Talk to your dietitian to identify your daily goals for nutrients listed on the label. ?Shopping ?Avoid buying canned, pre-made, or processed foods. These foods tend to be high in fat, sodium, and added sugar. ?Shop around the outside edge of the grocery store. This is where you will most often find  fresh fruits and vegetables, bulk grains, fresh meats, and fresh dairy products. ?Cooking ?Use low-heat cooking methods, such as baking, instead of high-heat cooking methods, such as deep frying. ?Cook using healthy oils, such as olive, canola, or sunflower oil. ?Avoid cooking with butter, cream, or high-fat meats. ?Meal planning ?Eat meals and snacks regularly, preferably at the same times every day. Avoid going long periods of time without eating. ?Eat foods that are high in fiber, such as fresh fruits, vegetables, beans, and whole grains. ?Eat 4-6 oz (112-168 g) of lean protein each day, such as lean meat, chicken, fish, eggs, or tofu. One ounce (oz) (28 g) of lean protein is equal to: ?1 oz (28 g) of meat, chicken, or fish. ?1 egg. ?? cup (62 g) of tofu. ?Eat some foods each day that contain healthy fats, such as avocado, nuts, seeds, and fish. ?What foods should I eat? ?Fruits ?Berries. Apples. Oranges. Peaches. Apricots. Plums. Grapes. Mangoes. Papayas. Pomegranates. Kiwi. Cherries. ?Vegetables ?Leafy greens, including lettuce, spinach, kale, chard, collard greens, mustard greens, and cabbage. Beets. Cauliflower. Broccoli. Carrots. Green beans. Tomatoes. Peppers. Onions. Cucumbers. Brussels sprouts. ?Grains ?Whole grains, such as whole-wheat or whole-grain bread, crackers, tortillas, cereal, and pasta. Unsweetened oatmeal. Quinoa. Brown or wild rice. ?Meats and other proteins ?Seafood. Poultry without skin. Lean cuts of poultry and beef. Tofu. Nuts. Seeds. ?Dairy ?Low-fat or fat-free dairy products such as milk, yogurt, and cheese. ?The items listed above may not be a complete list of foods and beverages you can eat and drink. Contact a dietitian for more information. ?What foods should I avoid? ?Fruits ?Fruits canned with syrup. ?Vegetables ?Canned vegetables. Frozen vegetables with butter or cream sauce. ?Grains ?Refined white flour and flour products such as bread, pasta, snack foods, and cereals. Avoid all  processed foods. ?Meats and other proteins ?Fatty cuts of meat. Poultry with skin. Breaded or fried meats. Processed meat. Avoid saturated fats. ?Dairy ?Full-fat yogurt, cheese, or milk. ?Beverages ?Sweetened drinks, such as soda or iced tea. ?The items listed above may not be a complete list of foods and beverages you should avoid. Contact a dietitian for more information. ?Questions to ask a health care provider ?Do I need to meet with a certified diabetes care and education specialist? ?Do I need to meet with a dietitian? ?What number can I call if I have questions? ?When are the best times to check my blood glucose? ?Where to find more information: ?American Diabetes Association: diabetes.org ?Academy of Nutrition and Dietetics: eatright.org ?[employer] of Diabetes and Digestive and Kidney Diseases: AmenCredit.is ?Association of Diabetes Care & Education Specialists: diabeteseducator.org ?Summary ?It is important to have healthy eating habits because your blood sugar (glucose) levels are greatly affected by what you eat and drink. It is important to use alcohol carefully. ?A healthy meal plan will help you manage your blood glucose and lower your risk of heart disease. ?Your health care provider may recommend that you work with a dietitian to make a meal plan that is best for you. ?This information is not intended to replace advice given to you by your health care provider. Make sure  you discuss any questions you have with your health care provider. ?Document Revised: 02/23/2020 Document Reviewed: 02/23/2020 ?Elsevier Patient Education ? [place]. ? ?

## 2021-11-15 NOTE — Progress Notes (Signed)
? ?Subjective:  ? ? Patient ID: Leah Lawson, female    DOB: 08/07/1955, 66 y.o.   MRN: [id number] ? ?HPI ?Chief Complaint  ?Patient presents with  ? Follow-up  ?  Would like to discuss her medications. She is not taking cholesterol medication because she doesn't like the way it makes her feel and some of her other medications are too expensive.  ? ?She is here to follow-up on several medical conditions including uncontrolled hypertension, diabetes, hyperlipidemia, vitamin D deficiency.   ?She also has a history of multiple thyroid nodules which also need follow-up  ? ?HTN- states she is taking losartan-HCTZ and no concerns with the medication.  States her blood pressure seems to be improving. ? ?DM- recent hemoglobin A1c of 7.4%.  I prescribed [name] and provided her with samples and a 30-day free voucher.  Reports taking the medication daily and no side effects. ?She has not tolerated metformin in the past. ?Does not check her blood sugars and does not want to. ? ?HL- LDL 103 and aortic atherosclerosis ?Refuses to take statin ? ?Vitamin D def- started on prescription vitamin D once weekly.  ? ?States she has recurrent growth on her genitalia and would like to see a dermatologist for this.  States she had something cut off there in the past and she is not sure if it grew back or if this is something new. ? ?Thyroid nodules which need follow up US. She would like to hold off on this for now.  ? ?She is under the care of her pulmonologist for lung disease.  She is now taking once daily Anoro and likes the medication. ? ?She has concerns regarding whether she can afford her medications going forward. ?Reports being on a fixed income. ? ?Denies fever, chills, dizziness, chest pain, palpitations, shortness of breath, abdominal pain, N/V/D, urinary symptoms. ? ?Reviewed allergies, medications, past medical, surgical, family, and social history. ? ? ? ?Review of Systems ?Pertinent positives and negatives in the history  of present illness. ? ?   ?Objective:  ? Physical Exam ?BP (!) 142/86 (BP Location: Left Arm, Patient Position: Sitting, Cuff Size: Large)   Pulse 85   Temp 98.2 ?F (36.8 ?C) (Temporal)   Ht '5\' 1"'$  (1.549 m)   Wt 249 lb (112.9 kg)   SpO2 90%   BMI 47.05 kg/m?  ? ?Alert and oriented and in no acute distress.  Respirations unlabored.  Not otherwise examined. ? ? ?   ?Assessment & Plan:  ?Uncontrolled hypertension ?-Reviewed medical records from previous PCP which showed that she did not tolerate amlodipine, hydralazine and beta-blockers contraindicated due to her COPD.  She will continue losartan HCTZ and follow-up with the hypertension clinic as scheduled.  She was referred at her previous visit. ? ? ?Aortic atherosclerosis ([place]) ?-Discussed increased risk for heart disease and stroke and encouraged statin therapy.  She declines. ? ?Hypertensive retinopathy, unspecified laterality ?-She is aware that good blood pressure control is needed to prevent worsening ? ?Multiple thyroid nodules ?-Declines work-up for now.  She is due for follow-up ultrasound and we will address this when she returns in June. ? ?Vitamin D deficiency ?-Reports taking once weekly prescription vitamin D. ? ?Statin declined ?-She is adamant that she does not want to take statin but cannot give me a definitive reason. ? 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 ?Counseling on potential long-term health risk associated with uncontrolled diabetes.  Discussed  diabetes spectrum and the need for statin therapy. ?Offered to send in monitor and testing supplies if she would like to start checking her blood sugar and she declines. ?She will follow-up in June for her 40-monthdiabetes visit. ? ? ?

## 2021-11-20 ENCOUNTER — Telehealth: Payer: Self-pay | Admitting: Pulmonary Disease

## 2021-11-21 ENCOUNTER — Other Ambulatory Visit: Payer: Self-pay | Admitting: Family Medicine

## 2021-11-21 DIAGNOSIS — E119 Type 2 diabetes mellitus without complications: Secondary | ICD-10-CM

## 2021-11-21 MED ORDER — ANORO ELLIPTA 62.5-25 MCG/ACT IN AEPB: 1.0000 | INHALATION_SPRAY | Freq: Every day | RESPIRATORY_TRACT | 3 refills | Status: DC

## 2021-11-21 NOTE — Telephone Encounter (Signed)
Called and spoke with patient. She stated that the anoro samples have been working and that RA told her we would send a prescription in if it was helping her.  ? ?Patient verified her pharmacy. Prescription of anoro was sent in. Nothing further needed. ?

## 2021-12-19 ENCOUNTER — Ambulatory Visit: Payer: Medicare Other

## 2021-12-19 DIAGNOSIS — G4733 Obstructive sleep apnea (adult) (pediatric): Secondary | ICD-10-CM

## 2021-12-25 ENCOUNTER — Telehealth: Payer: Self-pay | Admitting: Pulmonary Disease

## 2021-12-25 DIAGNOSIS — G4733 Obstructive sleep apnea (adult) (pediatric): Secondary | ICD-10-CM | POA: Diagnosis not present

## 2021-12-25 NOTE — Telephone Encounter (Signed)
Called and went over results with patient.  She is agreeable to both suggestions.    She requested that it be sent to her Leah Lawson so her daughter can read through results.  Nothing further needed.

## 2021-12-25 NOTE — Telephone Encounter (Signed)
HST showed severe  OSA with AHI 62/ hr ,lowest desat 58%  !! Suggest CPAP titration study  Please also send in Rx for autoCPAP  5-15 cm, mask of choice OV with me/APP in 6 wks

## 2022-01-09 ENCOUNTER — Encounter: Payer: Self-pay | Admitting: Cardiovascular Disease

## 2022-01-31 ENCOUNTER — Encounter: Payer: Self-pay | Admitting: Family Medicine

## 2022-01-31 ENCOUNTER — Ambulatory Visit (INDEPENDENT_AMBULATORY_CARE_PROVIDER_SITE_OTHER): Payer: Medicare Other | Admitting: Family Medicine

## 2022-01-31 VITALS — BP 130/90 | HR 82 | Temp 97.8°F | Ht 61.0 in | Wt 248.0 lb

## 2022-01-31 DIAGNOSIS — Z5320 Procedure and treatment not carried out because of patient's decision for unspecified reasons: Secondary | ICD-10-CM

## 2022-01-31 DIAGNOSIS — E559 Vitamin D deficiency, unspecified: Secondary | ICD-10-CM | POA: Diagnosis not present

## 2022-01-31 DIAGNOSIS — E785 Hyperlipidemia, unspecified: Secondary | ICD-10-CM

## 2022-01-31 DIAGNOSIS — N898 Other specified noninflammatory disorders of vagina: Secondary | ICD-10-CM | POA: Insufficient documentation

## 2022-01-31 DIAGNOSIS — E1169 Type 2 diabetes mellitus with other specified complication: Secondary | ICD-10-CM | POA: Insufficient documentation

## 2022-01-31 DIAGNOSIS — J439 Emphysema, unspecified: Secondary | ICD-10-CM

## 2022-01-31 DIAGNOSIS — L259 Unspecified contact dermatitis, unspecified cause: Secondary | ICD-10-CM

## 2022-01-31 DIAGNOSIS — D751 Secondary polycythemia: Secondary | ICD-10-CM | POA: Insufficient documentation

## 2022-01-31 DIAGNOSIS — E669 Obesity, unspecified: Secondary | ICD-10-CM | POA: Diagnosis not present

## 2022-01-31 LAB — COMPREHENSIVE METABOLIC PANEL
ALT: 8 U/L (ref 0–35)
AST: 13 U/L (ref 0–37)
Albumin: 4.2 g/dL (ref 3.5–5.2)
Alkaline Phosphatase: 77 U/L (ref 39–117)
BUN: 22 mg/dL (ref 6–23)
CO2: 30 mEq/L (ref 19–32)
Calcium: 9.7 mg/dL (ref 8.4–10.5)
Chloride: 104 mEq/L (ref 96–112)
Creatinine, Ser: 0.76 mg/dL (ref 0.40–1.20)
GFR: 81.38 mL/min (ref >60.00)
Glucose, Bld: 116 mg/dL — ABNORMAL HIGH (ref 70–99)
Potassium: 4.2 mEq/L (ref 3.5–5.1)
Sodium: 142 mEq/L (ref 135–145)
Total Bilirubin: 0.5 mg/dL (ref 0.2–1.2)
Total Protein: 7 g/dL (ref 6.0–8.3)

## 2022-01-31 LAB — CBC WITH DIFFERENTIAL/PLATELET
Basophils Absolute: 0 10*3/uL (ref 0.0–0.1)
Basophils Relative: 0.7 % (ref 0.0–3.0)
Eosinophils Absolute: 0.1 10*3/uL (ref 0.0–0.7)
Eosinophils Relative: 1.9 % (ref 0.0–5.0)
HCT: 49.9 % — ABNORMAL HIGH (ref 36.0–46.0)
Hemoglobin: 16.3 g/dL — ABNORMAL HIGH (ref 12.0–15.0)
Lymphocytes Relative: 25.6 % (ref 12.0–46.0)
Lymphs Abs: 1.3 10*3/uL (ref 0.7–4.0)
MCHC: 32.6 g/dL (ref 30.0–36.0)
MCV: 89.6 fl (ref 78.0–100.0)
Monocytes Absolute: 0.4 10*3/uL (ref 0.1–1.0)
Monocytes Relative: 7.2 % (ref 3.0–12.0)
Neutro Abs: 3.2 10*3/uL (ref 1.4–7.7)
Neutrophils Relative %: 64.6 % (ref 43.0–77.0)
Platelets: 239 10*3/uL (ref 150.0–400.0)
RBC: 5.57 Mil/uL — ABNORMAL HIGH (ref 3.87–5.11)
RDW: 15.2 % (ref 11.5–15.5)
WBC: 5 10*3/uL (ref 4.0–10.5)

## 2022-01-31 LAB — HEMOGLOBIN A1C: Hgb A1c MFr Bld: 7.5 % — ABNORMAL HIGH (ref 4.6–6.5)

## 2022-01-31 LAB — VITAMIN D 25 HYDROXY (VIT D DEFICIENCY, FRACTURES): VITD: 45.35 ng/mL (ref 30.00–100.00)

## 2022-01-31 MED ORDER — TRIAMCINOLONE ACETONIDE 0.1 % EX CREA: 1.0000 | TOPICAL_CREAM | Freq: Two times a day (BID) | CUTANEOUS | 0 refills | Status: DC

## 2022-01-31 MED ORDER — FLUCONAZOLE 150 MG PO TABS: 150.0000 mg | ORAL_TABLET | Freq: Once | ORAL | 0 refills | Status: AC

## 2022-01-31 NOTE — Patient Instructions (Addendum)
Please go to the lab on the first floor before you leave today.  Use the steroid cream on your hands as prescribed for the next 1 to 2 weeks and then stop.  I recommend that you take over-the-counter Zyrtec 1-2 times daily for the hand itching.  Look for triggers that could be causing your hands to itch.  Make sure you are limiting your carbohydrates such as potatoes, bread, pasta, rice and sugar.  Continue taking [place].  Let me know if it becomes too expensive or if you continue having vaginal itching.  Take the Diflucan pill and if you are still having vaginal itching by Monday, let us know.

## 2022-01-31 NOTE — Progress Notes (Signed)
Subjective:     Patient ID: Leah Lawson, female    DOB: 1955/10/22, 66 y.o.   MRN: [id number]  Chief Complaint  Patient presents with   Vaginal Itching    Has been going on for about 2 months   hand itching    For about 3 weeks    HPI Patient is in today for follow up on DM, HTN, vitamin D def, HL.  New concerns today includes bilateral palms itching and vaginal itching.   HTN- has not taken her medication today. Does not check BP at home.   DM- taking [place] daily. Does not check BS   HL- states she is not going to take statin.   Complains of a 3 wk hx of hands are itching and peeling.  Changed dishwashing liquids.    Vitamin D def- took the prescription   States she stopped smoking 4 years ago   Denies fever, chills, dizziness, chest pain, palpitations, shortness of breath, abdominal pain, N/V/D,  LE edema.    Health Maintenance Due  Topic Date Due   FOOT EXAM  Never done   TETANUS/TDAP  Never done   COLONOSCOPY (Pts 45-4yr Insurance coverage will need to be confirmed)  Never done   Zoster Vaccines- Shingrix (1 of 2) Never done   Pneumonia Vaccine 66 Years old (2 - PCV) 05/11/2019    Past Medical History:  Diagnosis Date   Asthma    COPD (chronic obstructive pulmonary disease) ([place]    Diabetes mellitus without complication ([place]    "pre-diabetic"   Hypertension    Hypertensive retinopathy 10/26/2021   Open-angle glaucoma 10/26/2021    Past Surgical History:  Procedure Laterality Date   BREAST SURGERY      Family History  Problem Relation Age of Onset   Arthritis Mother    Diabetes Mother    Heart disease Mother    High blood pressure Mother    Cancer Father    COPD Father    High Cholesterol Father    High blood pressure Father    Diabetes Brother    High blood pressure Brother    Stroke Brother    Cancer Maternal Grandfather    Breast cancer Cousin    Miscarriages / [place]aughter     Social History   Socioeconomic History    Marital status: Single    Spouse name: Not on file   Number of children: Not on file   Years of education: Not on file   Highest education level: Not on file  Occupational History   Not on file  Tobacco Use   Smoking status: Former    Packs/day: 0.50    Years: 45.00    Total pack years: 22.50    Types: Cigarettes    Quit date: 05/05/2018    Years since quitting: 3.7   Smokeless tobacco: Never  Vaping Use   Vaping Use: Never used  Substance and Sexual Activity   Alcohol use: Not Currently   Drug use: Never   Sexual activity: Not on file  Other Topics Concern   Not on file  Social History Narrative   Not on file   Social Determinants of Health   Financial Resource Strain: Not on file  Food Insecurity: Not on file  Transportation Needs: Not on file  Physical Activity: Not on file  Stress: Not on file  Social Connections: Not on file  Intimate Partner Violence: Not on file    Outpatient Medications Prior to Visit  Medication Sig Dispense Refill   albuterol (VENTOLIN HFA) 108 (90 Base) MCG/ACT inhaler Inhale 2 puffs into the lungs every 6 (six) hours as needed for wheezing or shortness of breath. 8 g 6   COD LIVER OIL PO Take by mouth.     dapagliflozin propanediol (FARXIGA) 5 MG TABS tablet TAKE 1 TABLET BY MOUTH ONCE DAILY BEFORE BREAKFAST 30 tablet 3   losartan-hydrochlorothiazide (HYZAAR) 100-25 MG tablet Take 1 tablet by mouth daily.     rosuvastatin (CRESTOR) 5 MG tablet Take 1 tablet (5 mg total) by mouth daily. 90 tablet 1   umeclidinium-vilanterol (ANORO ELLIPTA) 62.5-25 MCG/ACT AEPB Inhale 1 puff into the lungs daily. 60 each 3   Vitamin D, Ergocalciferol, (DRISDOL) 1.25 MG (50000 UNIT) CAPS capsule Take 1 capsule (50,000 Units total) by mouth every 7 (seven) days. (Patient not taking: Reported on 01/31/2022) 12 capsule 0   umeclidinium-vilanterol (ANORO ELLIPTA) 62.5-25 MCG/ACT AEPB Inhale 1 puff into the lungs daily.     No facility-administered medications  prior to visit.    Allergies  Allergen Reactions   Lisinopril Cough   Singulair [Montelukast Sodium] Other (See Comments)    headaches   Mucinex [Guaifenesin Er] Diarrhea and Rash    ROS Pertinent positives and negatives in the history of present illness.     Objective:    Physical Exam  BP 130/90 (BP Location: Right Arm, Patient Position: Sitting, Cuff Size: Large)   Pulse 82   Temp 97.8 F (36.6 C) (Temporal)   Ht '5\' 1"'$  (1.549 m)   Wt 248 lb (112.5 kg)   SpO2 93%   BMI 46.86 kg/m  Wt Readings from Last 3 Encounters:  01/31/22 248 lb (112.5 kg)  11/15/21 249 lb (112.9 kg)  11/08/21 250 lb (113.4 kg)   Bilateral hands with dryness and peeling on bilateral palms. No sign of infection.     Assessment & Plan:   Problem List Items Addressed This Visit       Respiratory   Pulmonary emphysema ([place])    Seeing pulmonologist         Endocrine   Diabetes mellitus type 2 in obese [facility])   Relevant Orders   Comprehensive metabolic panel (Completed)   Hemoglobin A1c (Completed)   CBC with Differential/Platelet (Completed)   Hyperlipidemia associated with type 2 diabetes mellitus ([place])    Refuses to take statin and is aware of potential health consequences due to uncontrolled cholesterol and aortic atherosclerosis         Musculoskeletal and Integument   Contact dermatitis - Primary    Possible trigger is dishwasher detergent. Look for triggers and avoid. Treat with triamcinolone. Follow up if worsening or not improving      Relevant Medications   triamcinolone cream (KENALOG) 0.1 %     Genitourinary   Vaginal itching    [name] is probable cause. Treat with diflucan and follow up if not improving        Other   Obesity, Class III, BMI 40-49.9 (morbid obesity) (HCC)   Polycythemia   Relevant Orders   Iron, TIBC and Ferritin Panel (Completed)   Statin declined   Vitamin D deficiency    Check vitamin D level and adjust dose as appropriate      Relevant  Orders   VITAMIN D 25 Hydroxy (Vit-D Deficiency, Fractures) (Completed)    I am having Leah Lawson start on triamcinolone cream and fluconazole. I am also having her maintain her albuterol, losartan-hydrochlorothiazide, COD LIVER OIL  PO, rosuvastatin, Vitamin D (Ergocalciferol), Anoro Ellipta, and Farxiga.  Meds ordered this encounter  Medications   triamcinolone cream (KENALOG) 0.1 %    Sig: Apply 1 Application topically 2 (two) times daily.    Dispense:  30 g    Refill:  0    Order Specific Question:   Supervising Provider    Answer:   Pricilla Holm A [[id number]]   fluconazole (DIFLUCAN) 150 MG tablet    Sig: Take 1 tablet (150 mg total) by mouth once for 1 dose.    Dispense:  1 tablet    Refill:  0    Order Specific Question:   Supervising Provider    Answer:   Pricilla Holm A [[id number]]

## 2022-02-01 DIAGNOSIS — L259 Unspecified contact dermatitis, unspecified cause: Secondary | ICD-10-CM | POA: Insufficient documentation

## 2022-02-01 LAB — IRON,TIBC AND FERRITIN PANEL
%SAT: 18 % (calc) (ref 16–45)
Ferritin: 25 ng/mL (ref 16–288)
Iron: 62 ug/dL (ref 45–160)
TIBC: 350 mcg/dL (calc) (ref 250–450)

## 2022-02-01 NOTE — Assessment & Plan Note (Signed)
Check vitamin D level and adjust dose as appropriate

## 2022-02-01 NOTE — Assessment & Plan Note (Signed)
Leah Lawson is probable cause. Treat with diflucan and follow up if not improving

## 2022-02-01 NOTE — Assessment & Plan Note (Signed)
Refuses to take statin and is aware of potential health consequences due to uncontrolled cholesterol and aortic atherosclerosis

## 2022-02-01 NOTE — Assessment & Plan Note (Signed)
Seeing pulmonologist 

## 2022-02-01 NOTE — Assessment & Plan Note (Signed)
Possible trigger is dishwasher detergent. Look for triggers and avoid. Treat with triamcinolone. Follow up if worsening or not improving

## 2022-02-11 ENCOUNTER — Ambulatory Visit (HOSPITAL_BASED_OUTPATIENT_CLINIC_OR_DEPARTMENT_OTHER): Payer: Medicare Other | Admitting: Cardiovascular Disease

## 2022-02-11 ENCOUNTER — Encounter (HOSPITAL_BASED_OUTPATIENT_CLINIC_OR_DEPARTMENT_OTHER): Payer: Self-pay | Admitting: Cardiovascular Disease

## 2022-02-11 VITALS — BP 148/92 | HR 86 | Ht 61.0 in | Wt 251.0 lb

## 2022-02-11 DIAGNOSIS — Z5181 Encounter for therapeutic drug level monitoring: Secondary | ICD-10-CM

## 2022-02-11 DIAGNOSIS — E1169 Type 2 diabetes mellitus with other specified complication: Secondary | ICD-10-CM | POA: Diagnosis not present

## 2022-02-11 DIAGNOSIS — E669 Obesity, unspecified: Secondary | ICD-10-CM

## 2022-02-11 DIAGNOSIS — I700 Atherosclerosis of aorta: Secondary | ICD-10-CM | POA: Diagnosis not present

## 2022-02-11 DIAGNOSIS — I10 Essential (primary) hypertension: Secondary | ICD-10-CM | POA: Diagnosis not present

## 2022-02-11 MED ORDER — SPIRONOLACTONE 25 MG PO TABS: 25.0000 mg | ORAL_TABLET | Freq: Every day | ORAL | 3 refills | Status: DC

## 2022-02-11 NOTE — Assessment & Plan Note (Signed)
She is currently tolerating [place].  Previously did not tolerate metformin.  Working on diet and exercise as above.  We did discuss Ozempic or other GLP-1 agonists.  She wants to think about this and will discuss with her PCP.  We will refer for for diabetes nutritionist.

## 2022-02-11 NOTE — Progress Notes (Signed)
[facility] Initial Assessment:    Date:  02/11/2022   ID:  Leah Lawson, DOB Feb 02, 1956, MRN [id number]  PCP:  Girtha Rm, NP-C  Cardiologist:  None   Referring MD: Girtha Rm, NP-C   CC: Hypertension  History of Present Illness:    Leah Lawson is a 66 y.o. female with a hx of hypertension, hyperlipidemia, diabetes, COPD, asthma, and hypertensive retinopathy, here to establish care in the [facility]. She saw her PCP on 10/18/21 and her blood pressure was 144/92. Her blood pressure had been uncontrolled for quite some time. She has tried several antihypertensives but did not tolerate most of them. She was kept on losartan/HCTZ and referred to the [facility]. She followed up with her PCP on 01/31/2022 and her blood pressure was 130/90. It was noted that she did not wish to start any statin medications.  Today, she states her blood pressure has always been borderline until last year. She had been controlled on HCTZ for the past 10 years. At one time this had been stopped with subsequent increased blood pressures. After resuming her meds her blood pressure remained uncontrolled since then. She now has tried multiple medications that were ineffective. At home her blood pressure is often in the 130's/80-90's. Occasionally she has a sudden sensation in her chest which she is not sure if related to her heart or lungs. She pauses very briefly to catch her breath. This may occur randomly such as during a conversation. She used her inhaler prior to today's appointment. At this time she complains of a slight headache. She is also frustrated with weight loss struggles. She regained about 3 lbs in the past few days. She endorses fluid retention. Sometimes she feels tightness in her fingers when waking up in the morning. Until the hot weather recently she was walking routinely for exercise. Normally she can walk around a track for 4 laps. Now she  has noticed only completed 2 laps with shortness of breath.Typically she drinks 1 cup of coffee in the morning. Lately she started drinking one soda a day for up to 3 days a week. Normally she cooks at home, and will add salt to her meals. Frequently she uses an air fryer and 100% whole wheat bread. She will try using Mrs. Dash or other low-sodium seasonings. For pain management she may take Aleve about twice a month for myalgias. She is a former smoker; she quit 4 years ago. She stopped drinking alcohol about 15 years ago. A month ago she obtained her CPAP. Previously she had COVID twice. During her initial infection she was in the hospital for 3-4 days. Her second infection was last August, but she did not need to go to the hospital. Additionally she recently had a mechanical fall resulting in a minor injury to her LLE. She denies any lightheadedness, syncope, orthopnea, or PND.  Previous antihypertensives: Lisinopril Hydralazine Amlodipine The patient's  Past Medical History:  Diagnosis Date   Asthma    COPD (chronic obstructive pulmonary disease) ([place])    Diabetes mellitus without complication ([place])    "pre-diabetic"   Hypertension    Hypertensive retinopathy 10/26/2021   Open-angle glaucoma 10/26/2021    Past Surgical History:  Procedure Laterality Date   BREAST SURGERY      Current Medications: Current Meds  Medication Sig   albuterol (VENTOLIN HFA) 108 (90 Base) MCG/ACT inhaler Inhale 2 puffs into the lungs every 6 (six) hours as needed for wheezing or  shortness of breath.   COD LIVER OIL PO Take by mouth.   dapagliflozin propanediol (FARXIGA) 5 MG TABS tablet TAKE 1 TABLET BY MOUTH ONCE DAILY BEFORE BREAKFAST   losartan-hydrochlorothiazide (HYZAAR) 100-25 MG tablet Take 1 tablet by mouth daily.   spironolactone (ALDACTONE) 25 MG tablet Take 1 tablet (25 mg total) by mouth daily.   triamcinolone cream (KENALOG) 0.1 % Apply 1 Application topically 2 (two) times daily.    umeclidinium-vilanterol (ANORO ELLIPTA) 62.5-25 MCG/ACT AEPB Inhale 1 puff into the lungs daily.     Allergies:   Hydralazine, Lisinopril, Singulair [montelukast sodium], and Mucinex [guaifenesin er]   Social History   Socioeconomic History   Marital status: Single    Spouse name: Not on file   Number of children: Not on file   Years of education: Not on file   Highest education level: Not on file  Occupational History   Not on file  Tobacco Use   Smoking status: Former    Packs/day: 0.50    Years: 45.00    Total pack years: 22.50    Types: Cigarettes    Quit date: 05/05/2018    Years since quitting: 3.7   Smokeless tobacco: Never  Vaping Use   Vaping Use: Never used  Substance and Sexual Activity   Alcohol use: Not Currently   Drug use: Never   Sexual activity: Not on file  Other Topics Concern   Not on file  Social History Narrative   Not on file   Social Determinants of Health   Financial Resource Strain: Not on file  Food Insecurity: Not on file  Transportation Needs: Not on file  Physical Activity: Not on file  Stress: Not on file  Social Connections: Not on file     Family History: The patient's family history includes Arthritis in her mother; Breast cancer in her cousin; COPD in her father; Cancer in her father and maternal grandfather; Dementia in her father; Diabetes in her brother and mother; Heart failure in her mother; High Cholesterol in her father; High blood pressure in her brother, father, and mother; Miscarriages / [place] in her daughter; Stroke in her brother.  ROS:   Please see the history of present illness.    (+) Headache (+) Exertional shortness of breath (+) Mechanical fall (+) Chest discomfort (+) Myalgias All other systems reviewed and are negative.  EKGs/Labs/Other Studies Reviewed:    CT Chest  06/05/2020: FINDINGS: Cardiovascular: No significant coronary artery calcification. Global cardiac size within normal limits. No  pericardial effusion. Central pulmonary arteries are enlarged in keeping with changes of pulmonary arterial hypertension. This has progressed slightly since prior examination. Mild atherosclerotic calcification is seen within the thoracic aorta. No aortic aneurysm.   Mediastinum/Nodes: 21 mm nodule is seen within the left thyroid lobe, enlarged since prior examination. No pathologic thoracic adenopathy. The esophagus is unremarkable.   Lungs/Pleura: Moderate emphysematous changes are again identified within the lungs. Superimposed scattered areas of parenchymal scarring are stable since prior examination. No focal pulmonary nodules or confluent pulmonary infiltrates are identified. No pneumothorax or pleural effusion. The central airways are widely patent.   Upper Abdomen: 9 mm nonobstructing calculus is seen within the visualized upper pole of the right kidney. Small hiatal hernia noted.   Musculoskeletal: No lytic or blastic bone lesion identified. Osseous structures are age-appropriate.   IMPRESSION: Stable changes of emphysema with superimposed scattered areas of parenchymal scarring. No focal pulmonary nodule identified.   Morphologic changes of pulmonary arterial hypertension, slightly  progressed since prior examination.   21 mm left thyroid nodule. A underlying benign etiology is suspected given its relatively slow interval growth since remote prior examination. This is, however, not definitively characterized on this examination and would meet criteria for dedicated thyroid sonography if clinically indicated. (ref: J Am Coll Radiol. 2015 Feb;12(2): 143-50).   Nonobstructing right nephrolithiasis, incompletely evaluated.   Aortic Atherosclerosis (ICD10-I70.0) and Emphysema (ICD10-J43.9).   EKG:  EKG is personally reviewed. 02/11/2022: Sinus rhythm. Rate 86 bpm.  Recent Labs: 10/18/2021: TSH 1.12 01/31/2022: ALT 8; BUN 22; Creatinine, Ser 0.76; Hemoglobin 16.3;  Platelets 239.0; Potassium 4.2; Sodium 142   Recent Lipid Panel    Component Value Date/Time   CHOL 171 10/18/2021 1127   TRIG 125.0 10/18/2021 1127   HDL 42.40 10/18/2021 1127   CHOLHDL 4 10/18/2021 1127   VLDL 25.0 10/18/2021 1127   LDLCALC 103 (H) 10/18/2021 1127    Physical Exam:    VS:  BP (!) 148/92 (BP Location: Right Arm, Patient Position: Sitting, Cuff Size: Large)   Pulse 86   Ht '5\' 1"'$  (1.549 m)   Wt 251 lb (113.9 kg)   BMI 47.43 kg/m  , BMI Body mass index is 47.43 kg/m. GENERAL:  Well appearing HEENT: Pupils equal round and reactive, fundi not visualized, oral mucosa unremarkable NECK:  No jugular venous distention, waveform within normal limits, carotid upstroke brisk and symmetric, no bruits, no thyromegaly LYMPHATICS:  No cervical adenopathy LUNGS:  Clear to auscultation bilaterally HEART:  RRR.  PMI not displaced or sustained,S1 and S2 within normal limits, no S3, no S4, no clicks, no rubs, no murmurs ABD:  Flat, positive bowel sounds normal in frequency in pitch, no bruits, no rebound, no guarding, no midline pulsatile mass, no hepatomegaly, no splenomegaly EXT:  2 plus pulses throughout, no edema, no cyanosis no clubbing SKIN:  No rashes no nodules, Ecchymosis of left shin NEURO:  Cranial nerves II through XII grossly intact, motor grossly intact throughout PSYCH:  Cognitively intact, oriented to person place and time   ASSESSMENT/PLAN:    Uncontrolled hypertension Blood pressure poorly controlled.  She has gained 40lb and is not very active.  She is interested in the PREP program at the [facility].  We will refer her.  She is doing a good job of eating at home and limiting her sodium intake.  We discussed limiting caffeine intake.  For now we will need to add an additional medication.  Add spironolactone 25 mg daily.  Check a BMP in a week.  Continue losartan/HCTZ.  She is not interested in our remote patient monitoring study.  Diabetes mellitus type 2 in obese  [facility]) She is currently tolerating [place].  Previously did not tolerate metformin.  Working on diet and exercise as above.  We did discuss Ozempic or other GLP-1 agonists.  She wants to think about this and will discuss with her PCP.  We will refer for for diabetes nutritionist.  Aortic atherosclerosis ([place]) Ideally her LDL should be less than 70.  She has been hesitant to start statins in the past.  For now we will work on building rapport and getting her blood pressure under control.  Continue these discussions in the future.   Screening for Secondary Hypertension:     02/11/2022    3:56 PM  Causes  Drugs/Herbals Screened     - Comments 1-2 caffeinated drink daily.  Limits salt.  Rare Aleve.  Renovascular HTN N/A  Sleep Apnea Screened     -  Comments Uses CPAP  Thyroid Disease Screened  Hyperaldosteronism N/A  Pheochromocytoma N/A    Relevant Labs/Studies:    Latest Ref Rng & Units 01/31/2022   11:14 AM 10/18/2021   11:27 AM 11/30/2019    6:58 AM  Basic Labs  Sodium 135 - 145 mEq/L 142  141  142   Potassium 3.5 - 5.1 mEq/L 4.2  4.0  4.1   Creatinine 0.40 - 1.20 mg/dL 0.76  0.73  0.73        Latest Ref Rng & Units 10/18/2021   11:27 AM 08/28/2010    9:20 PM  Thyroid   TSH 0.35 - 5.50 uIU/mL 1.12  1.144                  Disposition:    FU with APP/PharmD in 1 month for the next 3 months.   FU with Renu Asby C. Oval Linsey, MD, [facility] in 4 months.   Medication Adjustments/Labs and Tests Ordered: Current medicines are reviewed at length with the patient today.  Concerns regarding medicines are outlined above.   Orders Placed This Encounter  Procedures   Basic metabolic panel   Amb Referral To Provider Referral Exercise Program (P.R.E.P)   Ambulatory referral to Nutrition and Diabetic Education   EKG 12-Lead   Meds ordered this encounter  Medications   spironolactone (ALDACTONE) 25 MG tablet    Sig: Take 1 tablet (25 mg total) by mouth daily.    Dispense:  90 tablet     Refill:  3    I,Mathew Stumpf,acting as a scribe for Skeet Latch, MD.,have documented all relevant documentation on the behalf of Skeet Latch, MD,as directed by  Skeet Latch, MD while in the presence of Skeet Latch, MD.  I, [place] Oval Linsey, MD have reviewed all documentation for this visit.  The documentation of the exam, diagnosis, procedures, and orders on 02/11/2022 are all accurate and complete.   Signed, Skeet Latch, MD  02/11/2022 5:00 PM    [place]

## 2022-02-11 NOTE — Assessment & Plan Note (Addendum)
Blood pressure poorly controlled.  She has gained 40lb and is not very active.  She is interested in the PREP program at the [facility].  We will refer her.  She is doing a good job of eating at home and limiting her sodium intake.  We discussed limiting caffeine intake.  For now we will need to add an additional medication.  Add spironolactone 25 mg daily.  Check a BMP in a week.  Continue losartan/HCTZ.  She is not interested in our remote patient monitoring study.

## 2022-02-11 NOTE — Assessment & Plan Note (Signed)
Ideally her LDL should be less than 70.  She has been hesitant to start statins in the past.  For now we will work on building rapport and getting her blood pressure under control.  Continue these discussions in the future.

## 2022-02-11 NOTE — Patient Instructions (Addendum)
Medication Instructions:  START SPIRONOLACTONE 25 MG DAILY    Labwork: BMET IN 1 WEEK    Testing/Procedures: NONE   Follow-Up: 03/14/2022 11:30 am with Pharm D at the [facility] office  You will receive a phone call from the PREP exercise and nutrition program to schedule an initial assessment.  Referrals:  TO NUTRITION   Ambulatory referral to Nutrition and Diabetic Education Where: [facility] Address: [place] [address] Phone: [phone] IF YOU DO  NOT HEAR FROM THEM IN 2 WEEKS YOU CAN CALL THEM DIRECTLY AT NUMBER LISTED ABOVE  Special Instructions:  MONITOR YOUR BLOOD PRESSURE TWICE A DAY, LOG IN THE BOOK PROVIDED. BRING THE BOOK AND YOUR BLOOD PRESSURE MACHINE TO YOUR FOLLOW UP IN 1 MONTH    DASH Eating Plan DASH stands for "Dietary Approaches to Stop Hypertension." The DASH eating plan is a healthy eating plan that has been shown to reduce high blood pressure (hypertension). It may also reduce your risk for type 2 diabetes, heart disease, and stroke. The DASH eating plan may also help with weight loss. What are tips for following this plan?  General guidelines Avoid eating more than 2,300 mg (milligrams) of salt (sodium) a day. If you have hypertension, you may need to reduce your sodium intake to 1,500 mg a day. Limit alcohol intake to no more than 1 drink a day for nonpregnant women and 2 drinks a day for men. One drink equals 12 oz of beer, 5 oz of wine, or 1 oz of hard liquor. Work with your health care provider to maintain a healthy body weight or to lose weight. Ask what an ideal weight is for you. Get at least 30 minutes of exercise that causes your heart to beat faster (aerobic exercise) most days of the week. Activities may include walking, swimming, or biking. Work with your health care provider or diet and nutrition specialist (dietitian) to adjust your eating plan to your individual calorie  needs. Reading food labels  Check food labels for the amount of sodium per serving. Choose foods with less than 5 percent of the Daily Value of sodium. Generally, foods with less than 300 mg of sodium per serving fit into this eating plan. To find whole grains, look for the word "whole" as the first word in the ingredient list. Shopping Buy products labeled as "low-sodium" or "no salt added." Buy fresh foods. Avoid canned foods and premade or frozen meals. Cooking Avoid adding salt when cooking. Use salt-free seasonings or herbs instead of table salt or sea salt. Check with your health care provider or pharmacist before using salt substitutes. Do not fry foods. Cook foods using healthy methods such as baking, boiling, grilling, and broiling instead. Cook with heart-healthy oils, such as olive, canola, soybean, or sunflower oil. Meal planning Eat a balanced diet that includes: 5 or more servings of fruits and vegetables each day. At each meal, try to fill half of your plate with fruits and vegetables. Up to 6-8 servings of whole grains each day. Less than 6 oz of lean meat, poultry, or fish each day. A 3-oz serving of meat is about the same size as a deck of cards. One egg equals 1 oz. 2 servings of low-fat dairy each day. A serving of nuts, seeds, or beans 5 times each week. Heart-healthy fats. Healthy fats called Omega-3 fatty acids are found in foods such as flaxseeds and coldwater fish, like sardines, salmon, and mackerel. Limit  how much you eat of the following: Canned or prepackaged foods. Food that is high in trans fat, such as fried foods. Food that is high in saturated fat, such as fatty meat. Sweets, desserts, sugary drinks, and other foods with added sugar. Full-fat dairy products. Do not salt foods before eating. Try to eat at least 2 vegetarian meals each week. Eat more home-cooked food and less restaurant, buffet, and fast food. When eating at a restaurant, ask that your  food be prepared with less salt or no salt, if possible. What foods are recommended? The items listed may not be a complete list. Talk with your dietitian about what dietary choices are best for you. Grains Whole-grain or whole-wheat bread. Whole-grain or whole-wheat pasta. Brown rice. [place]. Bulgur. Whole-grain and low-sodium cereals. Pita bread. Low-fat, low-sodium crackers. Whole-wheat flour tortillas. Vegetables Fresh or frozen vegetables (raw, steamed, roasted, or grilled). Low-sodium or reduced-sodium tomato and vegetable juice. Low-sodium or reduced-sodium tomato sauce and tomato paste. Low-sodium or reduced-sodium canned vegetables. Fruits All fresh, dried, or frozen fruit. Canned fruit in natural juice (without added sugar). Meat and other protein foods Skinless chicken or [place]. Ground chicken or [place]. Pork with fat trimmed off. Fish and seafood. Egg whites. Dried beans, peas, or lentils. Unsalted nuts, nut butters, and seeds. Unsalted canned beans. Lean cuts of beef with fat trimmed off. Low-sodium, lean deli meat. Dairy Low-fat (1%) or fat-free (skim) milk. Fat-free, low-fat, or reduced-fat cheeses. Nonfat, low-sodium ricotta or cottage cheese. Low-fat or nonfat yogurt. Low-fat, low-sodium cheese. Fats and oils Soft margarine without trans fats. Vegetable oil. Low-fat, reduced-fat, or light mayonnaise and salad dressings (reduced-sodium). Canola, safflower, olive, soybean, and sunflower oils. Avocado. Seasoning and other foods Herbs. Spices. Seasoning mixes without salt. Unsalted popcorn and pretzels. Fat-free sweets. What foods are not recommended? The items listed may not be a complete list. Talk with your dietitian about what dietary choices are best for you. Grains Baked goods made with fat, such as croissants, muffins, or some breads. Dry pasta or rice meal packs. Vegetables Creamed or fried vegetables. Vegetables in a cheese sauce. Regular canned vegetables (not  low-sodium or reduced-sodium). Regular canned tomato sauce and paste (not low-sodium or reduced-sodium). Regular tomato and vegetable juice (not low-sodium or reduced-sodium). Angie Fava. Olives. Fruits Canned fruit in a light or heavy syrup. Fried fruit. Fruit in cream or butter sauce. Meat and other protein foods Fatty cuts of meat. Ribs. Fried meat. Berniece Salines. Sausage. Bologna and other processed lunch meats. Salami. Fatback. Hotdogs. Bratwurst. Salted nuts and seeds. Canned beans with added salt. Canned or smoked fish. Whole eggs or egg yolks. Chicken or [place] with skin. Dairy Whole or 2% milk, cream, and half-and-half. Whole or full-fat cream cheese. Whole-fat or sweetened yogurt. Full-fat cheese. Nondairy creamers. Whipped toppings. Processed cheese and cheese spreads. Fats and oils Butter. Stick margarine. Lard. Shortening. Ghee. Bacon fat. Tropical oils, such as coconut, palm kernel, or palm oil. Seasoning and other foods Salted popcorn and pretzels. Onion salt, garlic salt, seasoned salt, table salt, and sea salt. Worcestershire sauce. Tartar sauce. Barbecue sauce. Teriyaki sauce. Soy sauce, including reduced-sodium. Steak sauce. Canned and packaged gravies. Fish sauce. Oyster sauce. Cocktail sauce. Horseradish that you find on the shelf. Ketchup. Mustard. Meat flavorings and tenderizers. Bouillon cubes. Hot sauce and Tabasco sauce. Premade or packaged marinades. Premade or packaged taco seasonings. Relishes. Regular salad dressings. Where to find more information: National Heart, Lung, and [place]: https://wilson-eaton.com/ American Heart Association: www.heart.org Summary The DASH eating plan  is a healthy eating plan that has been shown to reduce high blood pressure (hypertension). It may also reduce your risk for type 2 diabetes, heart disease, and stroke. With the DASH eating plan, you should limit salt (sodium) intake to 2,300 mg a day. If you have hypertension, you may need to reduce your  sodium intake to 1,500 mg a day. When on the DASH eating plan, aim to eat more fresh fruits and vegetables, whole grains, lean proteins, low-fat dairy, and heart-healthy fats. Work with your health care provider or diet and nutrition specialist (dietitian) to adjust your eating plan to your individual calorie needs. This information is not intended to replace advice given to you by your health care provider. Make sure you discuss any questions you have with your health care provider. Document Released: 07/11/2011 Document Revised: 07/04/2017 Document Reviewed: 07/15/2016 Elsevier Patient Education  2020 [employer].

## 2022-02-15 ENCOUNTER — Ambulatory Visit (HOSPITAL_BASED_OUTPATIENT_CLINIC_OR_DEPARTMENT_OTHER): Payer: Medicare Other | Admitting: Cardiovascular Disease

## 2022-02-15 ENCOUNTER — Telehealth: Payer: Self-pay

## 2022-02-15 NOTE — Telephone Encounter (Signed)
Called to discuss PREP program she wants to attend [facility] class on 8/8, every T/Th 10-11:15; will contact end of July to set up assessment visit.

## 2022-02-22 LAB — BASIC METABOLIC PANEL
BUN/Creatinine Ratio: 27 (ref 12–28)
BUN: 23 mg/dL (ref 8–27)
CO2: 23 mmol/L (ref 20–29)
Calcium: 10 mg/dL (ref 8.7–10.3)
Chloride: 100 mmol/L (ref 96–106)
Creatinine, Ser: 0.84 mg/dL (ref 0.57–1.00)
Glucose: 119 mg/dL — ABNORMAL HIGH (ref 70–99)
Potassium: 4.7 mmol/L (ref 3.5–5.2)
Sodium: 140 mmol/L (ref 134–144)
eGFR: 77 mL/min/{1.73_m2} (ref >59)

## 2022-02-25 ENCOUNTER — Telehealth: Payer: Self-pay | Admitting: Pulmonary Disease

## 2022-02-25 NOTE — Telephone Encounter (Signed)
Called and spoke to patient and advised her that the samples would be upfront for her. And that we closed at 5pm today. Nothing further needed

## 2022-02-28 ENCOUNTER — Ambulatory Visit (HOSPITAL_BASED_OUTPATIENT_CLINIC_OR_DEPARTMENT_OTHER): Payer: Medicare Other | Attending: Pulmonary Disease | Admitting: Pulmonary Disease

## 2022-02-28 DIAGNOSIS — G4733 Obstructive sleep apnea (adult) (pediatric): Secondary | ICD-10-CM | POA: Diagnosis present

## 2022-03-01 DIAGNOSIS — G4733 Obstructive sleep apnea (adult) (pediatric): Secondary | ICD-10-CM

## 2022-03-01 NOTE — Procedures (Signed)
Patient Name: Leah Lawson, Leah Lawson Date: 02/28/2022 Gender: Female D.O.B: 08-04-1956 Age (years): 20 Referring Provider: Kara Mead MD, ABSM Height (inches): 62 Interpreting Physician: Kara Mead MD, ABSM Weight (lbs): 249 RPSGT: Jorge Ny BMI: 46 MRN: [id number] Neck Size: 15.00 <br> <br> CLINICAL INFORMATION The patient is referred for a CPAP titration to treat sleep apnea.    HST 12/2021 showed severe  OSA with AHI 62/ hr ,lowest desat 58%   SLEEP STUDY TECHNIQUE As per the AASM Manual for the Scoring of Sleep and Associated Events v2.3 (April 2016) with a hypopnea requiring 4% desaturations.  The channels recorded and monitored were frontal, central and occipital EEG, electrooculogram (EOG), submentalis EMG (chin), nasal and oral airflow, thoracic and abdominal wall motion, anterior tibialis EMG, snore microphone, electrocardiogram, and pulse oximetry. Continuous positive airway pressure (CPAP) was initiated at the beginning of the study and titrated to treat sleep-disordered breathing.  MEDICATIONS Medications self-administered by patient taken the night of the study : N/A  TECHNICIAN COMMENTS Comments added by technician: Patient had difficulty initiating sleep. Patient was restless all through the night. O2 initiated due to low sats.  RESPIRATORY PARAMETERS Optimal PAP Pressure (cm): 7 AHI at Optimal Pressure (/hr): 0 Overall Minimal O2 (%): 85.0 Supine % at Optimal Pressure (%): 95  Minimal O2 at Optimal Pressure (%): 85.0   SLEEP ARCHITECTURE The study was initiated at 10:14:20 PM and ended at 5:16:56 AM.  Sleep onset time was 49.8 minutes and the sleep efficiency was 64.1%%. The total sleep time was 270.8 minutes.  The patient spent 10.9%% of the night in stage N1 sleep, 80.4%% in stage N2 sleep, 0.0%% in stage N3 and 8.7% in REM.Stage REM latency was 235.5 minutes  Wake after sleep onset was 102.0. Alpha intrusion was absent. Supine sleep was  11.10%.  CARDIAC DATA The 2 lead EKG demonstrated sinus rhythm. The mean heart rate was 68.0 beats per minute. Other EKG findings include: None.   LEG MOVEMENT DATA The total Periodic Limb Movements of Sleep (PLMS) were 0. The PLMS index was 0.0. A PLMS index of <15 is considered normal in adults.  IMPRESSIONS - An optimal PAP pressure of 7 cm  was  selected for this patient based on the available study data. - Severe oxygen desaturations were observed during this titration > O2 was added & titrated to 4 L - The patient snored with soft snoring volume during this titration study. - No cardiac abnormalities were observed during this study. - Clinically significant periodic limb movements were not noted during this study. Arousals associated with PLMs were significant.   DIAGNOSIS - Obstructive Sleep Apnea (G47.33)   RECOMMENDATIONS - Recommend a trial of CPAP 7 cm with 4 L O2 blended in with medium full face mask - Avoid alcohol, sedatives and other CNS depressants that may worsen sleep apnea and disrupt normal sleep architecture. - Sleep hygiene should be reviewed to assess factors that may improve sleep quality. - Weight management and regular exercise should be initiated or continued. - Return to [facility] Center for re-evaluation after 4 weeks of therapy   Kara Mead MD Board Certified in [place]

## 2022-03-04 ENCOUNTER — Ambulatory Visit (HOSPITAL_BASED_OUTPATIENT_CLINIC_OR_DEPARTMENT_OTHER): Payer: Medicare Other | Admitting: Cardiovascular Disease

## 2022-03-05 ENCOUNTER — Encounter: Payer: Self-pay | Admitting: Pulmonary Disease

## 2022-03-05 ENCOUNTER — Ambulatory Visit: Payer: Medicare Other | Admitting: Pulmonary Disease

## 2022-03-05 DIAGNOSIS — G4733 Obstructive sleep apnea (adult) (pediatric): Secondary | ICD-10-CM

## 2022-03-05 DIAGNOSIS — J432 Centrilobular emphysema: Secondary | ICD-10-CM

## 2022-03-05 NOTE — Assessment & Plan Note (Signed)
We provided her with samples of Anoro and I have referred her to patient assistance program. She will continue to use albuterol as needed. We discussed COPD action plan and signs and symptoms of COPD exacerbation

## 2022-03-05 NOTE — Progress Notes (Signed)
   Subjective:    Patient ID: Leah Lawson, female    DOB: 1955-08-15, 66 y.o.   MRN: [id number]  HPI  66 yo ex-smoker for follow-up of COPD & OSA.   She smoked about a pack per day for 40 years until she quit in 2019.  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  We started on CPAP and she has settled down somewhat using a fullface mask.  She underwent CPAP titration study which surprisingly showed that she required up to 4 L of oxygen due to severe desaturations She is worried about cost when she wants to know why she needs oxygen. She is unable to afford Anoro    Significant tests/ events reviewed  CPAP titration >> 7 cm + 4 L O2 HST 12/2021 AHI 62/h, lowest desat 58% PFTs 12/2020 moderate airway obstruction, ratio 57, FEV1 68%, FVC 92%, TLC 109%, DLCO 9.7/53%   CT chest WO con 06/2020 emphysema , thyroid nodule   Review of Systems neg for any significant sore throat, dysphagia, itching, sneezing, nasal congestion or excess/ purulent secretions, fever, chills, sweats, unintended wt loss, pleuritic or exertional cp, hempoptysis, orthopnea pnd or change in chronic leg swelling. Also denies presyncope, palpitations, heartburn, abdominal pain, nausea, vomiting, diarrhea or change in bowel or urinary habits, dysuria,hematuria, rash, arthralgias, visual complaints, headache, numbness weakness or ataxia.      Objective:   Physical Exam  Gen. Pleasant, obese, in no distress ENT - no lesions, no post nasal drip Neck: No JVD, no thyromegaly, no carotid bruits Lungs: no use of accessory muscles, no dullness to percussion, decreased without rales or rhonchi  Cardiovascular: Rhythm regular, heart sounds  normal, no murmurs or gallops, no peripheral edema Musculoskeletal: No deformities, no  cyanosis or clubbing , no tremors         Assessment & Plan:

## 2022-03-05 NOTE — Patient Instructions (Signed)
  X Assistance with Anoro  CPAP is working well We decided to hold off on oxygen

## 2022-03-05 NOTE — Assessment & Plan Note (Signed)
CPAP download was reviewed which shows good compliance about 4.5 hours on average on auto settings 5 to 15 cm with average pressure of 13 and maximum pressure 14 cm with mild leak. She is very compliant and CPAP is only helped improve her daytime somnolence and fatigue  Surprisingly she required 4 L oxygen during the study but would like to hold off due to cost issues.  I explained importance of oxygen but can consider repeating nocturnal oximetry on CPAP/room air after few weeks of CPAP use  Weight loss encouraged, compliance with goal of at least 4-6 hrs every night is the expectation. Advised against medications with sedative side effects Cautioned against driving when sleepy - understanding that sleepiness will vary on a day to day basis

## 2022-03-06 NOTE — Progress Notes (Signed)
[facility] PREP Evaluation  Patient Details  Name: Leah Lawson MRN: [id number] Date of Birth: Feb 22, 1956 Age: 66 y.o. PCP: Girtha Rm, NP-C  Vitals:   03/06/22 1057  BP: 110/70  Pulse: (!) 101  Weight: 248 lb 6.4 oz (112.7 kg)     [facility] Eval - 03/06/22 1000       [facility] "PREP" Location   [facility] "PREP" Location [facility]      Referral    Referring Provider Elsworth Soho    Reason for referral Inactivity;Obesitity/Overweight;Hypertension    Program Start Date 03/12/22      Measurement   Waist Circumference 53.5 inches    Hip Circumference 55 inches      Information for Trainer   Goals --   Lose 10 lbs by end of program; Consistent BP <120/80   Current Exercise walking    Orthopedic Concerns --   pinched nerve in back   Pertinent Medical History --   OSA, COPD, HTN, diabetes   Medications that affect exercise Asthma inhaler      Timed Up and Go (TUGS)   Timed Up and Go Moderate risk 10-12 seconds      Mobility and Daily Activities   I find it easy to walk up or down two or more flights of stairs. 2    I have no trouble taking out the trash. 2    I do housework such as vacuuming and dusting on my own without difficulty. 3    I can easily lift a gallon of milk (8lbs). 4    I can easily walk a mile. 1    I have no trouble reaching into high cupboards or reaching down to pick up something from the floor. 1    I do not have trouble doing out-door work such as Armed forces logistics/support/administrative officer, raking leaves, or gardening. 2      Mobility and Daily Activities   I feel younger than my age. 3    I feel independent. 4    I feel energetic. 3    I live an active life.  4    I feel strong. 4    I feel healthy. 3    I feel active as other people my age. 4      How fit and strong are you.   Fit and Strong Total Score 40            Past Medical History:  Diagnosis Date   Asthma    COPD (chronic obstructive pulmonary disease) ([place])    Diabetes mellitus without complication ([place])     "pre-diabetic"   Hypertension    Hypertensive retinopathy 10/26/2021   Open-angle glaucoma 10/26/2021   Past Surgical History:  Procedure Laterality Date   BREAST SURGERY     Social History   Tobacco Use  Smoking Status Former   Packs/day: 0.50   Years: 45.00   Total pack years: 22.50   Types: Cigarettes   Quit date: 05/05/2018   Years since quitting: 3.8  Smokeless Tobacco Never  To begin PREP class 8/8 at [facility], every T/Th 10-11:15 At today's visit, sats were in high 80's/low 90's; encouraged to cough and deep breath with sats rising to the mid 90's; she has IS at home, encouraged her to remember to do breathing exercises throughout her day  Zacarias Pontes B Chaya Dehaan 03/06/2022, 11:06 AM

## 2022-03-12 ENCOUNTER — Other Ambulatory Visit: Payer: Self-pay | Admitting: Family Medicine

## 2022-03-12 DIAGNOSIS — E119 Type 2 diabetes mellitus without complications: Secondary | ICD-10-CM

## 2022-03-12 NOTE — Progress Notes (Signed)
[facility] PREP Weekly Session  Patient Details  Name: Natallie Ravenscroft MRN: [id number] Date of Birth: March 16, 1956 Age: 66 y.o. PCP: Girtha Rm, NP-C  There were no vitals filed for this visit.   [facility] Weekly seesion - 03/12/22 1100       [facility] "PREP" Location   [facility] "PREP" Location [facility]      Weekly Session   Topic Discussed Goal setting and welcome to the program   Introductions, review of workbook, tour of facility, offered option to exercise on cardio machine   Classes attended to date [place] 03/12/2022, 11:50 AM

## 2022-03-13 LAB — HEMOGLOBIN A1C: Hemoglobin A1C: 7.3

## 2022-03-14 ENCOUNTER — Ambulatory Visit: Payer: Medicare Other | Admitting: Pharmacist Clinician (PhC)/ Clinical Pharmacy Specialist

## 2022-03-14 ENCOUNTER — Encounter: Payer: Self-pay | Admitting: Pharmacist Clinician (PhC)/ Clinical Pharmacy Specialist

## 2022-03-14 DIAGNOSIS — I10 Essential (primary) hypertension: Secondary | ICD-10-CM

## 2022-03-14 NOTE — Patient Instructions (Signed)
Return for a a follow up appointment in Tuesday November 14 at 10:30 am   Check your blood pressure at home once or twice daily and keep record of the readings.  Take your BP meds as follows:  Continue with your current medications  Bring all of your meds, your BP cuff and your record of home blood pressures to your next appointment.  Exercise as you're able, try to walk approximately 30 minutes per day.  Keep salt intake to a minimum, especially watch canned and prepared boxed foods.  Eat more fresh fruits and vegetables and fewer canned items.  Avoid eating in fast food restaurants.    HOW TO TAKE YOUR BLOOD PRESSURE: Rest 5 minutes before taking your blood pressure.  Don't smoke or drink caffeinated beverages for at least 30 minutes before. Take your blood pressure before (not after) you eat. Sit comfortably with your back supported and both feet on the floor (don't cross your legs). Elevate your arm to heart level on a table or a desk. Use the proper sized cuff. It should fit smoothly and snugly around your bare upper arm. There should be enough room to slip a fingertip under the cuff. The bottom edge of the cuff should be 1 inch above the crease of the elbow. Ideally, take 3 measurements at one sitting and record the average.

## 2022-03-14 NOTE — Assessment & Plan Note (Signed)
Patient with essential hypertension, now doing well on combination of losartan hctz and spironolactone.  Diastolic readings still slightly elevated, but will work on consistency of how and when she takes her BP at home for now.  Reviewed information on technique and asked that she continue to check 3-4 times per week as able.  Reviewed information on salt intake and ways to start decreasing the amount in her diet.  She should continue with her current medications and we will see her back in 3 months for follow up.

## 2022-03-14 NOTE — Progress Notes (Signed)
03/14/2022 Leah Lawson Jun 13, 1956 [id number]   HPI:  Leah Lawson is a 66 y.o. female patient of Dr Oval Linsey, with a [place] below who presents today for hypertension clinic evaluation.  She was referred by her PCP due to uncontrolled hypertension and intolerance to multiple medications.  When she saw Dr. Oval Linsey last month her pressure was 148/92 and she noted that BP was uncontrolled for past year.  Previously she had done well with HCTZ, but his was stopped.  Her pressure went up and since then has been poorly controlled despite restarting medication.  Dr. Oval Linsey started spironolactone 25 mg daily and referred her to the PREP exercise program.  Today she returns for follow up.  She is just into her second week of the PREP program.  Feeling well overall, just had a birthday last week.  She isn't sure her home meter is working correctly, and brought it to the office today to be checked.  No concerns about the spironolactone.    Past Medical History: hyperlipidemia 3/23 LDL 103  DM2 6/23 A1c 7.5 - On Farxiga, did not tolerate metformin  Asthma/COPD On Anoro Ellipta, albuterol inhalers     Blood Pressure Goal:  130/80  Current Medications: losartan hctz 100/25, spironolactone 25 mg  Family Hx:  mother had CHF, died from infection; brother had stroke; 4 kids, no hypertension  Social Hx: no, quit 4 years ago; no alcohol; coffee in the morning 1 cup; recently stopped lipton green tea and diet coke - heard about cancer scare with aspartame; some Coke  Diet: mostly home cooked, is trying to cut back on salt   Exercise: started PREP last week  Home BP readings: home meter Omron 3 series - read within 10 points of office cuff  17 AM readings average 124/84 HR 88, (range 96-146/77-97)  23 PM readings average 126/84 HR 92, (range 106-147/77-92)  Intolerances: lisinopril - cough; hydralazine - LEE  Labs: 02/21/22:  Na 140, K 4.7, Glu 119, BUN 23, SCr 0.84, GFR 77   Wt Readings from  Last 3 Encounters:  03/06/22 248 lb 6.4 oz (112.7 kg)  03/05/22 250 lb 9.6 oz (113.7 kg)  02/28/22 249 lb (112.9 kg)   BP Readings from Last 3 Encounters:  03/14/22 118/81  03/06/22 110/70  03/05/22 122/84   Pulse Readings from Last 3 Encounters:  03/14/22 76  03/06/22 (!) 101  03/05/22 89    Current Outpatient Medications  Medication Sig Dispense Refill   albuterol (VENTOLIN HFA) 108 (90 Base) MCG/ACT inhaler Inhale 2 puffs into the lungs every 6 (six) hours as needed for wheezing or shortness of breath. 8 g 6   COD LIVER OIL PO Take by mouth.     dapagliflozin propanediol (FARXIGA) 5 MG TABS tablet TAKE 1 TABLET BY MOUTH ONCE DAILY BEFORE BREAKFAST 30 tablet 3   losartan-hydrochlorothiazide (HYZAAR) 100-25 MG tablet Take 1 tablet by mouth daily.     spironolactone (ALDACTONE) 25 MG tablet Take 1 tablet (25 mg total) by mouth daily. 90 tablet 3   triamcinolone cream (KENALOG) 0.1 % Apply 1 Application topically 2 (two) times daily. 30 g 0   umeclidinium-vilanterol (ANORO ELLIPTA) 62.5-25 MCG/ACT AEPB Inhale 1 puff into the lungs daily. 60 each 3   No current facility-administered medications for this visit.    Allergies  Allergen Reactions   Hydralazine Swelling    In feet and ankle   Lisinopril Cough   Singulair [Montelukast Sodium] Other (See Comments)    headaches  Mucinex [Guaifenesin Er] Diarrhea and Rash    Past Medical History:  Diagnosis Date   Asthma    COPD (chronic obstructive pulmonary disease) ([place])    Diabetes mellitus without complication ([place])    "pre-diabetic"   Hypertension    Hypertensive retinopathy 10/26/2021   Open-angle glaucoma 10/26/2021    Blood pressure 118/81, pulse 76.  Uncontrolled hypertension Patient with essential hypertension, now doing well on combination of losartan hctz and spironolactone.  Diastolic readings still slightly elevated, but will work on consistency of how and when she takes her BP at home for now.  Reviewed  information on technique and asked that she continue to check 3-4 times per week as able.  Reviewed information on salt intake and ways to start decreasing the amount in her diet.  She should continue with her current medications and we will see her back in 3 months for follow up.    Tommy Medal PharmD CPP [facility] [address] [place], [place] [address] [phone]

## 2022-03-18 ENCOUNTER — Other Ambulatory Visit: Payer: Self-pay | Admitting: Pulmonary Disease

## 2022-03-19 NOTE — Progress Notes (Signed)
[facility] PREP Weekly Session  Patient Details  Name: Leah Lawson MRN: [id number] Date of Birth: [birth year] Age: 66 y.o. PCP: Girtha Rm, NP-C  Vitals:   03/19/22 1337  Weight: 248 lb 3.2 oz (112.6 kg)     [facility] Weekly seesion - 03/19/22 1300       [facility] "PREP" Location   [facility] "PREP" Location [facility]      Weekly Session   Topic Discussed Other ways to be active;Importance of resistance training   Goal: 150 min/cardio each wk; strength training 2-3 times a wk for 20-40 minutes   Minutes exercised this week 150 minutes    Classes attended to date 3             [place] 03/19/2022, 1:39 PM

## 2022-03-26 NOTE — Progress Notes (Signed)
[facility] PREP Weekly Session  Patient Details  Name: Leah Lawson MRN: [id number] Date of Birth: August 15, 1955 Age: 66 y.o. PCP: Girtha Rm, NP-C  There were no vitals filed for this visit.   [facility] Weekly seesion - 03/26/22 1100       [facility] "PREP" Location   [facility] "PREP" Location [facility]      Weekly Session   Topic Discussed Healthy eating tips   Salt 1500-2300 mg/day, 24 gms added sugar, introduced Yuka app; avoid enriched/processed products, avoid frying, enc to eat the rainbow of colors   Minutes exercised this week 140 minutes    Classes attended to date [place] 03/26/2022, 11:41 AM

## 2022-04-02 NOTE — Progress Notes (Signed)
[facility] PREP Weekly Session  Patient Details  Name: Leah Lawson MRN: [id number] Date of Birth: Aug 21, 1955 Age: 66 y.o. PCP: Girtha Rm, NP-C  There were no vitals filed for this visit.   [facility] Weekly seesion - 04/02/22 1100       [facility] "PREP" Location   [facility] "PREP" Location [facility]      Weekly Session   Topic Discussed Health habits   Sugar demo, limit added sugars 24gms/day; YUKA app; water: 1/2 body in oz or 64 oz/day   Minutes exercised this week 200 minutes    Classes attended to date [place] 04/02/2022, 11:41 AM

## 2022-04-09 ENCOUNTER — Encounter: Payer: Self-pay | Admitting: Family Medicine

## 2022-04-10 ENCOUNTER — Telehealth: Payer: Self-pay | Admitting: *Deleted

## 2022-04-10 NOTE — Telephone Encounter (Signed)
Matter was addressed with Maryann Conners in office. Looked at patients charts and she was wanting samples of Anoro and we are currently out of samples at 04/10/22. Then she asked for [facility] and we do not have any samples of dulera.   Patient did drop of [place] paperwork. Nothing further needed

## 2022-04-11 ENCOUNTER — Other Ambulatory Visit: Payer: Self-pay | Admitting: Family Medicine

## 2022-04-11 DIAGNOSIS — Z1231 Encounter for screening mammogram for malignant neoplasm of breast: Secondary | ICD-10-CM

## 2022-04-12 NOTE — Telephone Encounter (Signed)
Noted that forms were dropped in Dr Angus Palms box. Nothing further needed

## 2022-04-12 NOTE — Telephone Encounter (Signed)
[facility] for anoro assistance forms faxed

## 2022-04-16 ENCOUNTER — Encounter: Payer: Self-pay | Admitting: *Deleted

## 2022-04-16 NOTE — Progress Notes (Signed)
[facility] PREP Weekly Session  Patient Details  Name: Leah Lawson MRN: [id number] Date of Birth: Aug 07, 1955 Age: 66 y.o. PCP: Girtha Rm, NP-C  Vitals:   04/16/22 1149  Weight: 248 lb (112.5 kg)     [facility] Weekly seesion - 04/16/22 1100       [facility] "PREP" Location   [facility] "PREP" Location [facility]      Weekly Session   Topic Discussed Stress management and problem solving   Importance of sleep, good sleep hygiene, introduction to guided meditation   Minutes exercised this week 220 minutes    Classes attended to date [place], [place] 04/16/2022, 11:55 AM

## 2022-04-23 ENCOUNTER — Encounter: Payer: Self-pay | Admitting: *Deleted

## 2022-04-23 NOTE — Progress Notes (Signed)
[facility] PREP Weekly Session  Patient Details  Name: Leah Lawson MRN: [id number] Date of Birth: 09-29-1955 Age: 66 y.o. PCP: Girtha Rm, NP-C  Vitals:   04/23/22 1201  Weight: 247 lb 12.8 oz (112.4 kg)     [facility] Weekly seesion - 04/23/22 1200       [facility] "PREP" Location   [facility] "PREP" Location [facility]      Weekly Session   Topic Discussed Expectations and non-scale victories   Half way through program, re-visiting goals, evaluating progress, staying positive.and managing challenges.   Minutes exercised this week 245 minutes    Classes attended to date [place], [place] 04/23/2022, 12:10 PM

## 2022-04-30 ENCOUNTER — Encounter: Payer: Self-pay | Admitting: *Deleted

## 2022-04-30 NOTE — Progress Notes (Signed)
[facility] PREP Weekly Session  Patient Details  Name: Leah Lawson MRN: [id number] Date of Birth: 1956-04-15 Age: 66 y.o. PCP: Girtha Rm, NP-C  There were no vitals filed for this visit.   [facility] Weekly seesion - 04/30/22 1200       [facility] "PREP" Location   [facility] "PREP" Location [facility]      Weekly Session   Topic Discussed Other   Portion control with visual aids, deceptive food labeling.   Minutes exercised this week 190 minutes    Classes attended to date [place], [place] 04/30/2022, 12:21 PM

## 2022-05-07 ENCOUNTER — Encounter: Payer: Self-pay | Admitting: *Deleted

## 2022-05-07 NOTE — Progress Notes (Signed)
[facility] PREP Weekly Session  Patient Details  Name: Jolaine Fryberger MRN: [id number] Date of Birth: 1956-05-11 Age: 66 y.o. PCP: Girtha Rm, NP-C  Vitals:   05/07/22 1202  Weight: 248 lb (112.5 kg)     [facility] Weekly seesion - 05/07/22 1200       [facility] "PREP" Location   [facility] "PREP" Location [facility]      Weekly Session   Topic Discussed Finding support   Accountability measures internal and external, accountabiity partners, identifying sabotaging behaviors.   Minutes exercised this week 160 minutes    Classes attended to date [place], [place] 05/07/2022, 12:04 PM

## 2022-05-14 ENCOUNTER — Encounter: Payer: Self-pay | Admitting: *Deleted

## 2022-05-14 NOTE — Progress Notes (Signed)
[facility] PREP Weekly Session  Patient Details  Name: Leah Lawson MRN: [id number] Date of Birth: 1955-10-02 Age: 66 y.o. PCP: Girtha Rm, NP-C  Vitals:   05/14/22 1211  Weight: 248 lb (112.5 kg)     [facility] Weekly seesion - 05/14/22 1200       [facility] "PREP" Location   [facility] "PREP" Location [facility]      Weekly Session   Topic Discussed Calorie breakdown   Review macronutrients,eating healthy on a budget.   Minutes exercised this week 160 minutes    Classes attended to date [place], [place] 05/14/2022, 12:13 PM

## 2022-05-21 ENCOUNTER — Encounter: Payer: Self-pay | Admitting: *Deleted

## 2022-05-21 NOTE — Progress Notes (Signed)
[facility] PREP Weekly Session  Patient Details  Name: Leah Lawson MRN: [id number] Date of Birth: [birth year] Age: 66 y.o. PCP: Girtha Rm, NP-C  Vitals:   05/21/22 1229  Weight: 247 lb 12.8 oz (112.4 kg)     [facility] Weekly seesion - 05/21/22 1200       [facility] "PREP" Location   [facility] "PREP" Location [facility]      Weekly Session   Topic Discussed Hitting roadblocks   Review. Strategies to improve success with continued exercise and healthy lifestyle changes.   Minutes exercised this week 260 minutes    Classes attended to date [place] 05/21/2022, 12:32 PM

## 2022-05-28 ENCOUNTER — Encounter: Payer: Self-pay | Admitting: *Deleted

## 2022-05-28 NOTE — Progress Notes (Signed)
[facility] PREP Weekly Session  Patient Details  Name: Leah Lawson MRN: [id number] Date of Birth: 14-Jan-1956 Age: 66 y.o. PCP: Girtha Rm, NP-C  Vitals:   05/28/22 1233  Weight: 246 lb 6.4 oz (111.8 kg)     [facility] Weekly seesion - 05/28/22 1200       [facility] "PREP" Location   [facility] "PREP" Location [facility]      Weekly Session   Topic Discussed Other   Final class, Fit and strong survey, FIT Testing, [employer] talk. Final questions.   Minutes exercised this week 240 minutes    Classes attended to date [place], [place] 05/28/2022, 12:35 PM

## 2022-05-30 ENCOUNTER — Other Ambulatory Visit: Payer: Self-pay | Admitting: Pulmonary Disease

## 2022-05-30 ENCOUNTER — Encounter: Payer: Self-pay | Admitting: *Deleted

## 2022-05-30 NOTE — Progress Notes (Signed)
[facility] PREP Evaluation  Patient Details  Name: Leah Lawson MRN: [id number] Date of Birth: Dec 14, 1955 Age: 66 y.o. PCP: Girtha Rm, NP-C  Vitals:   05/30/22 1015  BP: (!) 108/44  Pulse: 88  Resp: 20  SpO2: 94%  Weight: 247 lb 12.8 oz (112.4 kg)     [facility] Eval - 05/30/22 1015       [facility] "PREP" Location   [facility] "PREP" Location [facility]      Referral    Referring Provider Alva    Reason for referral Hypertension;Obesitity/Overweight;Inactivity   COPD   Program Start Date 03/12/22      Measurement   Waist Circumference 52.5 inches    Hip Circumference 54.5 inches      Mobility and Daily Activities   I find it easy to walk up or down two or more flights of stairs. 1    I have no trouble taking out the trash. 4    I do housework such as vacuuming and dusting on my own without difficulty. 4    I can easily lift a gallon of milk (8lbs). 4    I can easily walk a mile. 2    I have no trouble reaching into high cupboards or reaching down to pick up something from the floor. 4    I do not have trouble doing out-door work such as Armed forces logistics/support/administrative officer, raking leaves, or gardening. 1      Mobility and Daily Activities   I feel younger than my age. 3    I feel independent. 4    I feel energetic. 3    I live an active life.  3    I feel strong. 4    I feel healthy. 3    I feel active as other people my age. 4      How fit and strong are you.   Fit and Strong Total Score 44            Past Medical History:  Diagnosis Date   Asthma    COPD (chronic obstructive pulmonary disease) ([place])    Diabetes mellitus without complication ([place])    "pre-diabetic"   Hypertension    Hypertensive retinopathy 10/26/2021   Open-angle glaucoma 10/26/2021   Past Surgical History:  Procedure Laterality Date   BREAST SURGERY     Social History   Tobacco Use  Smoking Status Former   Packs/day: 0.50   Years: 45.00   Total pack years: 22.50   Types: Cigarettes   Quit date:  05/05/2018   Years since quitting: 4.0  Smokeless Tobacco Never    [place] 05/30/2022, 4:45 PM  Completed PREP. Attended 12 of 12 education classes and 10 of 11 exercise classes. Improved How Fit and Strong survey by 4 points. Reports continuing to struggle with making healthy dietary choices but is working on applying what she has learned in class. She has completed a detailed goal and activity plan which we reviewed together to continue her journey toward improved health.

## 2022-06-04 ENCOUNTER — Ambulatory Visit
Admission: RE | Admit: 2022-06-04 | Discharge: 2022-06-04 | Disposition: A | Discharge status: Home / Self Care | Payer: Medicare Other | Source: Ambulatory Visit | Attending: Family Medicine | Admitting: Family Medicine

## 2022-06-04 DIAGNOSIS — Z1231 Encounter for screening mammogram for malignant neoplasm of breast: Secondary | ICD-10-CM

## 2022-06-05 ENCOUNTER — Ambulatory Visit: Payer: Medicare Other

## 2022-06-05 ENCOUNTER — Encounter: Payer: Self-pay | Admitting: Adult Health

## 2022-06-05 ENCOUNTER — Ambulatory Visit (INDEPENDENT_AMBULATORY_CARE_PROVIDER_SITE_OTHER): Payer: Medicare Other | Admitting: Adult Health

## 2022-06-05 VITALS — BP 118/62 | HR 87 | Temp 98.4°F | Ht 61.5 in | Wt 247.4 lb

## 2022-06-05 DIAGNOSIS — J432 Centrilobular emphysema: Secondary | ICD-10-CM | POA: Diagnosis not present

## 2022-06-05 DIAGNOSIS — Z23 Encounter for immunization: Secondary | ICD-10-CM | POA: Diagnosis not present

## 2022-06-05 DIAGNOSIS — G4733 Obstructive sleep apnea (adult) (pediatric): Secondary | ICD-10-CM | POA: Diagnosis not present

## 2022-06-05 NOTE — Progress Notes (Signed)
$'@Patient'D$  ID: Leah Lawson, female    DOB: [birth year], 66 y.o.   MRN: [id number]  Chief Complaint  Patient presents with   Follow-up    Referring provider: Girtha Rm, NP-C  HPI: 66 year old female former smoker followed for COPD and obstructive sleep apnea  TEST/EVENTS :  CPAP titration >> 7 cm + 4 L O2 HST 12/2021 AHI 62/h, lowest desat 58% PFTs 12/2020 moderate airway obstruction, ratio 57, FEV1 68%, FVC 92%, TLC 109%, DLCO 9.7/53%   CT chest WO con 06/2020 emphysema , thyroid nodule  06/05/2022 Follow up: COPD and OSA  Patient presents for a 79-monthfollow-up.  Patient has underlying COPD.  She is on Anoro daily. Was Recently got Medicaid.  She says overall breathing is doing well.  She denies any flare of cough or wheezing.  We discussed getting flu shot today.  She denies any hemoptysis chest pain orthopnea PND or increased leg swelling.  Chest x-ray May 20, 2022 shows stable chronic emphysema.  Patient was diagnosed with severe sleep apnea with home sleep study May 2023 showing severe sleep apnea with AHI 62/hour, SPO2 low at 58%.  She underwent a CPAP titration study with optimal control at 7 cm H2O and 4 L of oxygen.  Patient has started on CPAP.  But says that she just absolutely does not like it.  Is very difficult to wear.  Patient is very frustrated says that the CPAP mask is making her miserable.  It leaks all the time she has tried to tighten it tried a smaller mask is not working.  She is very frustrated. She feels very claustrophobic and feels that this is causing her skin to be irritated and her eyes to be dried out.  CPAP download shows minimum usage.  Daily average usage at 4 hours.  Patient is on auto CPAP 5 to 15 cm H2O.  Daily average pressure at 13.3 cm H2O.  AHI 0.3.  Positive leaks.   Allergies  Allergen Reactions   Hydralazine Swelling    In feet and ankle   Lisinopril Cough   Singulair [Montelukast Sodium] Other (See Comments)    headaches    Mucinex [Guaifenesin Er] Diarrhea and Rash    Immunization History  Administered Date(s) Administered   Fluad Quad(high Dose 65+) 06/05/2022   Influenza,inj,Quad PF,6+ Mos 05/10/2018   Pneumococcal Polysaccharide-23 05/10/2018    Past Medical History:  Diagnosis Date   Asthma    COPD (chronic obstructive pulmonary disease) ([place]    Diabetes mellitus without complication ([place]    "pre-diabetic"   Hypertension    Hypertensive retinopathy 10/26/2021   Open-angle glaucoma 10/26/2021    Tobacco History: Social History   Tobacco Use  Smoking Status Former   Packs/day: 0.50   Years: 45.00   Total pack years: 22.50   Types: Cigarettes   Quit date: 05/05/2018   Years since quitting: 4.0  Smokeless Tobacco Never   Counseling given: Not Answered   Outpatient Medications Prior to Visit  Medication Sig Dispense Refill   albuterol (VENTOLIN HFA) 108 (90 Base) MCG/ACT inhaler INHALE 2 PUFFS BY MOUTH EVERY 6 HOURS AS NEEDED FOR WHEEZING FOR SHORTNESS OF BREATH 9 g 0   ANORO ELLIPTA 62.5-25 MCG/ACT AEPB Inhale 1 puff by mouth once daily 28 each 0   COD LIVER OIL PO Take by mouth.     dapagliflozin propanediol (FARXIGA) 5 MG TABS tablet TAKE 1 TABLET BY MOUTH ONCE DAILY BEFORE BREAKFAST 30 tablet 3   losartan-hydrochlorothiazide (HYZAAR)  100-25 MG tablet Take 1 tablet by mouth daily.     spironolactone (ALDACTONE) 25 MG tablet Take 1 tablet (25 mg total) by mouth daily. 90 tablet 3   triamcinolone cream (KENALOG) 0.1 % Apply 1 Application topically 2 (two) times daily. 30 g 0   No facility-administered medications prior to visit.     Review of Systems:   Constitutional:   No  weight loss, night sweats,  Fevers, chills, + fatigue, or  lassitude.  HEENT:   No headaches,  Difficulty swallowing,  Tooth/dental problems, or  Sore throat,                No sneezing, itching, ear ache, nasal congestion, post nasal drip,   CV:  No chest pain,  Orthopnea, PND, swelling in lower  extremities, anasarca, dizziness, palpitations, syncope.   GI  No heartburn, indigestion, abdominal pain, nausea, vomiting, diarrhea, change in bowel habits, loss of appetite, bloody stools.   Resp: .  No chest wall deformity  Skin: no rash or lesions.  GU: no dysuria, change in color of urine, no urgency or frequency.  No flank pain, no hematuria   MS:  No joint pain or swelling.  No decreased range of motion.  No back pain.    Physical Exam  BP 118/62 (BP Location: Right Arm, Cuff Size: Large)   Pulse 87   Temp 98.4 F (36.9 C) (Temporal)   Ht 5' 1.5" (1.562 m)   Wt 247 lb 6.4 oz (112.2 kg)   SpO2 93%   BMI 45.99 kg/m   GEN: A/Ox3; pleasant , NAD, well nourished    HEENT:  [place]/AT,   NOSE-clear, THROAT-clear, no lesions, no postnasal drip or exudate noted.  Class III MP airway  NECK:  Supple w/ fair ROM; no JVD; normal carotid impulses w/o bruits; no thyromegaly or nodules palpated; no lymphadenopathy.    RESP  Clear  P & A; w/o, wheezes/ rales/ or rhonchi. no accessory muscle use, no dullness to percussion  CARD:  RRR, no m/r/g, no peripheral edema, pulses intact, no cyanosis or clubbing.  GI:   Soft & nt; nml bowel sounds; no organomegaly or masses detected.   Musco: Warm bil, no deformities or joint swelling noted.   Neuro: alert, no focal deficits noted.    Skin: Warm, no lesions or rashes    Lab Results:    BMET   BNP   ProBNP No results found for: "PROBNP"  Imaging: No results found.       Latest Ref Rng & Units 11/24/2020    2:52 PM  PFT Results  FVC-Pre L 2.08   FVC-Predicted Pre % 92   FVC-Post L 2.05   FVC-Predicted Post % 91   Pre FEV1/FVC % % 57   Post FEV1/FCV % % 60   FEV1-Pre L 1.19   FEV1-Predicted Pre % 68   FEV1-Post L 1.23   DLCO uncorrected ml/min/mmHg 9.73   DLCO UNC% % 53   DLCO corrected ml/min/mmHg 9.73   DLCO COR %Predicted % 53   DLVA Predicted % 58   TLC L 5.12   TLC % Predicted % 109   RV % Predicted % 153      No results found for: "NITRICOXIDE"      Assessment & Plan:   OSA (obstructive sleep apnea) Severe sleep apnea-patient is having trouble tolerating CPAP.  We went over multiple different mask options.  Patient would like to try the DreamWear nasal mask. Helpful hints  discussed on CPAP tolerance.  Plan  Patient Instructions  Continue on ANORO 1 puff daily  Albuterol inhaler As needed   Flu shot today   Try the dream wear nasal mask .  Wear CPAP all night long .  Work on healthy weight loss  Do not drive if sleepy   Follow up with Dr. Elsworth Soho  in 3 months and As needed        Pulmonary emphysema ([place]) Compensated on present regimen.  Continue on Anoro.  Flu shot today.  Plan  Patient Instructions  Continue on ANORO 1 puff daily  Albuterol inhaler As needed   Flu shot today   Try the dream wear nasal mask .  Wear CPAP all night long .  Work on healthy weight loss  Do not drive if sleepy   Follow up with Dr. Elsworth Soho  in 3 months and As needed        Obesity, Class III, BMI 40-49.9 (morbid obesity) ([place]) Healthy weight loss discussed     Rexene Edison, NP 06/05/2022

## 2022-06-05 NOTE — Assessment & Plan Note (Signed)
Severe sleep apnea-patient is having trouble tolerating CPAP.  We went over multiple different mask options.  Patient would like to try the DreamWear nasal mask. Helpful hints discussed on CPAP tolerance.  Plan  Patient Instructions  Continue on ANORO 1 puff daily  Albuterol inhaler As needed   Flu shot today   Try the dream wear nasal mask .  Wear CPAP all night long .  Work on healthy weight loss  Do not drive if sleepy   Follow up with Dr. Elsworth Soho  in 3 months and As needed

## 2022-06-05 NOTE — Patient Instructions (Addendum)
Continue on ANORO 1 puff daily  Albuterol inhaler As needed   Flu shot today   Try the dream wear nasal mask .  Wear CPAP all night long .  Work on healthy weight loss  Do not drive if sleepy   Follow up with Dr. Elsworth Soho  in 3 months and As needed

## 2022-06-05 NOTE — Assessment & Plan Note (Signed)
Compensated on present regimen.  Continue on Anoro.  Flu shot today.  Plan  Patient Instructions  Continue on ANORO 1 puff daily  Albuterol inhaler As needed   Flu shot today   Try the dream wear nasal mask .  Wear CPAP all night long .  Work on healthy weight loss  Do not drive if sleepy   Follow up with Dr. Elsworth Soho  in 3 months and As needed

## 2022-06-05 NOTE — Assessment & Plan Note (Signed)
Healthy weight loss discussed 

## 2022-06-18 ENCOUNTER — Encounter: Payer: Self-pay | Admitting: Pharmacist Clinician (PhC)/ Clinical Pharmacy Specialist

## 2022-06-18 ENCOUNTER — Ambulatory Visit
Payer: Medicare Other | Attending: Internal Medicine | Admitting: Pharmacist Clinician (PhC)/ Clinical Pharmacy Specialist

## 2022-06-18 VITALS — BP 121/72 | HR 79

## 2022-06-18 DIAGNOSIS — I10 Essential (primary) hypertension: Secondary | ICD-10-CM | POA: Diagnosis not present

## 2022-06-18 NOTE — Patient Instructions (Signed)
Return for a a follow up appointment May 1 at 10:20 am with Dr. Oval Linsey at the [facility] office  Check your blood pressure at home 2-3 days each week and keep record of the readings.  Take your BP meds as follows:  Continue with your current medications  Bring all of your meds, your BP cuff and your record of home blood pressures to your next appointment.  Exercise as you're able, try to walk approximately 30 minutes per day.  Keep salt intake to a minimum, especially watch canned and prepared boxed foods.  Eat more fresh fruits and vegetables and fewer canned items.  Avoid eating in fast food restaurants.    HOW TO TAKE YOUR BLOOD PRESSURE: Rest 5 minutes before taking your blood pressure.  Don't smoke or drink caffeinated beverages for at least 30 minutes before. Take your blood pressure before (not after) you eat. Sit comfortably with your back supported and both feet on the floor (don't cross your legs). Elevate your arm to heart level on a table or a desk. Use the proper sized cuff. It should fit smoothly and snugly around your bare upper arm. There should be enough room to slip a fingertip under the cuff. The bottom edge of the cuff should be 1 inch above the crease of the elbow. Ideally, take 3 measurements at one sitting and record the average.

## 2022-06-18 NOTE — Assessment & Plan Note (Signed)
Patient with essential hypertension, now doing well on combination of spironolactone, losartan and hctz.  She has finished the PREP exercise program and now joined the [facility].  She should continue to monitor her home BP readings a few days each week.  Will have her follow up with Dr. Oval Linsey in 6 months.  She is aware to reach out to the office before then should she have any concerns.

## 2022-06-18 NOTE — Progress Notes (Signed)
06/18/2022 Leah Lawson 09/20/1955 [id number]   HPI:  Leah Lawson is a 66 y.o. female patient of Dr Oval Linsey, with a [place] below who presents today for hypertension clinic evaluation.  She was referred by her PCP due to uncontrolled hypertension and intolerance to multiple medications.  When she saw Dr. Oval Linsey last month her pressure was 148/92 and she noted that BP was uncontrolled for past year.  Previously she had done well with HCTZ, but his was stopped.  Her pressure went up and since then has been poorly controlled despite restarting medication.  Dr. Oval Linsey started spironolactone 25 mg daily and referred her to the PREP exercise program.  Since then her blood pressures have been doing much better and at her last visit the only concern was accuracy of the home cuff.  She brought it in and we found it to be within 10 points of the office reading.    Today she returns for follow up.  She has now finished the PREP exercise program and joined the [facility].  She will get three sessions with a trainer there, but has not had a chance to schedule those yet.    Past Medical History: hyperlipidemia 3/23 LDL 103  DM2 6/23 A1c 7.5 - On Farxiga, did not tolerate metformin  Asthma/COPD On Anoro Ellipta, albuterol inhalers     Blood Pressure Goal:  130/80  Current Medications: losartan hctz 100/25, spironolactone 25 mg  Family Hx:  mother had CHF, died from infection; brother had stroke; 4 kids, no hypertension  Social Hx: no tobacco, quit 4 years ago; no alcohol; coffee in the morning 1 cup; recently stopped lipton green tea and diet coke - heard about cancer scare with aspartame; some Coke  Diet: mostly home cooked, is trying to cut back on salt   Exercise: finished PREP and joined the [facility], but has not been in the past 2 weeks  Home BP readings: home meter Omron 3 series - read within 10 points of office cuff  13 AM readings average 118/79  HR 80   5  PM readings average 118/77  HR  86  Intolerances: lisinopril - cough; hydralazine - LEE  Labs: 02/21/22:  Na 140, K 4.7, Glu 119, BUN 23, SCr 0.84, GFR 77   Wt Readings from Last 3 Encounters:  06/05/22 247 lb 6.4 oz (112.2 kg)  05/30/22 247 lb 12.8 oz (112.4 kg)  05/28/22 246 lb 6.4 oz (111.8 kg)   BP Readings from Last 3 Encounters:  06/18/22 121/72  06/05/22 118/62  05/30/22 (!) 108/44   Pulse Readings from Last 3 Encounters:  06/18/22 79  06/05/22 87  05/30/22 88    Current Outpatient Medications  Medication Sig Dispense Refill   albuterol (VENTOLIN HFA) 108 (90 Base) MCG/ACT inhaler INHALE 2 PUFFS BY MOUTH EVERY 6 HOURS AS NEEDED FOR WHEEZING FOR SHORTNESS OF BREATH 9 g 0   ANORO ELLIPTA 62.5-25 MCG/ACT AEPB Inhale 1 puff by mouth once daily 28 each 0   COD LIVER OIL PO Take by mouth.     dapagliflozin propanediol (FARXIGA) 5 MG TABS tablet TAKE 1 TABLET BY MOUTH ONCE DAILY BEFORE BREAKFAST 30 tablet 3   losartan-hydrochlorothiazide (HYZAAR) 100-25 MG tablet Take 1 tablet by mouth daily.     spironolactone (ALDACTONE) 25 MG tablet Take 1 tablet (25 mg total) by mouth daily. 90 tablet 3   triamcinolone cream (KENALOG) 0.1 % Apply 1 Application topically 2 (two) times daily. 30 g 0  No current facility-administered medications for this visit.    Allergies  Allergen Reactions   Hydralazine Swelling    In feet and ankle   Lisinopril Cough   Singulair [Montelukast Sodium] Other (See Comments)    headaches   Mucinex [Guaifenesin Er] Diarrhea and Rash    Past Medical History:  Diagnosis Date   Asthma    COPD (chronic obstructive pulmonary disease) ([place])    Diabetes mellitus without complication ([place])    "pre-diabetic"   Hypertension    Hypertensive retinopathy 10/26/2021   Open-angle glaucoma 10/26/2021    Blood pressure 121/72, pulse 79.  Uncontrolled hypertension Patient with essential hypertension, now doing well on combination of spironolactone, losartan and hctz.  She has finished the  PREP exercise program and now joined the [facility].  She should continue to monitor her home BP readings a few days each week.  Will have her follow up with Dr. Oval Linsey in 6 months.  She is aware to reach out to the office before then should she have any concerns.      Tommy Medal PharmD CPP [facility] [address] [place], [place] [address] [phone]

## 2022-06-30 ENCOUNTER — Other Ambulatory Visit: Payer: Self-pay | Admitting: Family Medicine

## 2022-06-30 ENCOUNTER — Other Ambulatory Visit: Payer: Self-pay | Admitting: Adult Health

## 2022-06-30 DIAGNOSIS — E119 Type 2 diabetes mellitus without complications: Secondary | ICD-10-CM

## 2022-07-01 ENCOUNTER — Telehealth: Payer: Self-pay | Admitting: Family Medicine

## 2022-07-01 ENCOUNTER — Telehealth: Payer: Self-pay | Admitting: Cardiovascular Disease

## 2022-07-01 MED ORDER — LOSARTAN POTASSIUM-HCTZ 100-25 MG PO TABS: 1.0000 | ORAL_TABLET | Freq: Every day | ORAL | 1 refills | Status: DC

## 2022-07-01 NOTE — Telephone Encounter (Signed)
Caller & Relationship to patient: PT  Call back number: [phone]  Date of last office visit: 01/31/2022  Date of next office visit: 08/14/2022  Medication(s) to be refilled:  losartan-hydrochlorothiazide (HYZAAR) 100-25 MG tablet       Preferred Pharmacy:   [facility], [place].   PT stated that this medication was originally filled by former PCP and that they had her on a 90 refill cycle

## 2022-07-01 NOTE — Telephone Encounter (Signed)
*  STAT* If patient is at the pharmacy, call can be transferred to refill team.   1. Which medications need to be refilled? (please list name of each medication and dose if known)  losartan-hydrochlorothiazide (HYZAAR) 100-25 MG tablet   2. Which pharmacy/location (including street and city if local pharmacy) is medication to be sent to?  [facility], [place].    3. Do they need a 30 day or 90 day supply? 90 day  Patient has 2 tablets left.

## 2022-07-01 NOTE — Telephone Encounter (Signed)
1st fill with you.. ok to refill?  

## 2022-07-01 NOTE — Telephone Encounter (Signed)
Rx request sent to pharmacy.  

## 2022-07-01 NOTE — Telephone Encounter (Signed)
Called pt to advise that Vickie would like for her to reach out to [facility] clinic for refills as they are helping manage her BP. Pt instantly got angry and started yelling "I don't have time for that. She is my primary doctor. She needs to refill my meds" I relayed to the pt that the HTN clinic is managing these medications and tried to explain the importance of them being in charge of the dosage and management to which she continued yelling "it doesn't matter, they just changed some of my medicine and she can see that in there" and continued to argue. I again relayed the importance and tried to explain that due to the changes they are making it is important that they stick with managing this medication. Pt continued yelling and proceeded to say "if she is gonna be like this I'm finding another primary care" and hung up.

## 2022-07-01 NOTE — Telephone Encounter (Signed)
Pt called upset. Advised of Dr. Oval Linsey cardiology and of [facility]. Pt does not understand why her PP will not call in her medicine, stating the other provider says she is well under control now. Please advise.  [phone]

## 2022-07-10 ENCOUNTER — Ambulatory Visit (INDEPENDENT_AMBULATORY_CARE_PROVIDER_SITE_OTHER): Payer: Medicare Other

## 2022-07-10 ENCOUNTER — Encounter: Payer: Self-pay | Admitting: Family Medicine

## 2022-07-10 ENCOUNTER — Ambulatory Visit (INDEPENDENT_AMBULATORY_CARE_PROVIDER_SITE_OTHER): Payer: Medicare Other | Admitting: Family Medicine

## 2022-07-10 VITALS — BP 130/84 | HR 80 | Temp 97.5°F | Ht 61.5 in | Wt 246.0 lb

## 2022-07-10 DIAGNOSIS — R051 Acute cough: Secondary | ICD-10-CM

## 2022-07-10 DIAGNOSIS — J439 Emphysema, unspecified: Secondary | ICD-10-CM | POA: Diagnosis not present

## 2022-07-10 DIAGNOSIS — J22 Unspecified acute lower respiratory infection: Secondary | ICD-10-CM

## 2022-07-10 DIAGNOSIS — R5383 Other fatigue: Secondary | ICD-10-CM

## 2022-07-10 LAB — CBC WITH DIFFERENTIAL/PLATELET
Basophils Absolute: 0 10*3/uL (ref 0.0–0.1)
Basophils Relative: 0.6 % (ref 0.0–3.0)
Eosinophils Absolute: 0.2 10*3/uL (ref 0.0–0.7)
Eosinophils Relative: 2.7 % (ref 0.0–5.0)
HCT: 52.5 % — ABNORMAL HIGH (ref 36.0–46.0)
Hemoglobin: 17.7 g/dL — ABNORMAL HIGH (ref 12.0–15.0)
Lymphocytes Relative: 26.4 % (ref 12.0–46.0)
Lymphs Abs: 1.5 10*3/uL (ref 0.7–4.0)
MCHC: 33.7 g/dL (ref 30.0–36.0)
MCV: 92.2 fl (ref 78.0–100.0)
Monocytes Absolute: 0.4 10*3/uL (ref 0.1–1.0)
Monocytes Relative: 7.5 % (ref 3.0–12.0)
Neutro Abs: 3.5 10*3/uL (ref 1.4–7.7)
Neutrophils Relative %: 62.8 % (ref 43.0–77.0)
Platelets: 286 10*3/uL (ref 150.0–400.0)
RBC: 5.7 Mil/uL — ABNORMAL HIGH (ref 3.87–5.11)
RDW: 13.8 % (ref 11.5–15.5)
WBC: 5.6 10*3/uL (ref 4.0–10.5)

## 2022-07-10 LAB — POC COVID19 BINAXNOW: SARS Coronavirus 2 Ag: NEGATIVE

## 2022-07-10 MED ORDER — AZITHROMYCIN 250 MG PO TABS: ORAL_TABLET | ORAL | 0 refills | Status: AC

## 2022-07-10 MED ORDER — BENZONATATE 200 MG PO CAPS: 200.0000 mg | ORAL_CAPSULE | Freq: Two times a day (BID) | ORAL | 0 refills | Status: DC | PRN

## 2022-07-10 NOTE — Patient Instructions (Signed)
Please go downstairs for labs and a chest x-ray before you leave today.  Take the antibiotic as prescribed. Use Tessalon Perles for cough as needed.  Stay well-hydrated by drinking water.  Continue treating your emphysema with Anoro and use albuterol as needed for chest tightness, wheezing and coughing.  Follow-up if you are getting worse or not improving in the next 3 to 4 days.

## 2022-07-10 NOTE — Progress Notes (Signed)
Subjective:  Leah Lawson is a 66 y.o. female who presents for a 10-12 day hx of nasal congestion, post nasal drainage, chest congestion and cough. Having mild shortness of breath and dizziness.  Back started aching 2 days ago.   Denies fever, chills, palpitations, abdominal pain, N/V/D.   Taking Coricidin.   No other aggravating or relieving factors.  No other c/o.  ROS as in subjective.   Objective: Vitals:   07/10/22 0824  BP: 130/84  Pulse: 80  Temp: (!) 97.5 F (36.4 C)  SpO2: 93%    General appearance: Alert, WD/WN, no distress, mildly ill appearing                             Skin: warm, no rash                           Head: no sinus tenderness                            Eyes: conjunctiva normal, corneas clear, PERRLA                            Ears: pearly TMs, slight bulging of R TM, external ear canals normal                          Nose: septum midline, turbinates swollen, with erythema              Mouth/throat: MMM, tongue normal, mild pharyngeal erythema                           Neck: supple, no adenopathy, no thyromegaly, nontender                          Heart: RRR, normal S1, S2, no murmurs                         Lungs: faint exp wheezes, worse on right      Assessment: Lower respiratory infection - Plan: CBC with Differential/Platelet, DG Chest 2 View, azithromycin (ZITHROMAX) 250 MG tablet  Acute cough - Plan: CBC with Differential/Platelet, DG Chest 2 View, benzonatate (TESSALON) 200 MG capsule  Fatigue, unspecified type - Plan: CBC with Differential/Platelet, DG Chest 2 View  Pulmonary emphysema, unspecified emphysema type ([place]) - Plan: DG Chest 2 View   Plan: Negative Covid test.  CXR and CBC ordered. She has underlying emphysema.  Azithromycin and Tessalon ordered.   Suggested symptomatic OTC remedies. Nasal saline spray for congestion.  Tylenol prn. Continue Anoro daily and albuterol prn.  Open angle glaucoma so we will avoid  steroids if possible.  Follow up if worsening

## 2022-07-10 NOTE — Addendum Note (Signed)
Addended by: Rossie Muskrat on: 07/10/2022 11:29 AM   Modules accepted: Orders

## 2022-07-11 ENCOUNTER — Telehealth: Payer: Self-pay | Admitting: Pulmonary Disease

## 2022-07-11 NOTE — Progress Notes (Signed)
Please let her know that her body is making extra red blood cells to make up for the lack of oxygen due to sleep apnea and COPD. I strongly encourage her to start using a CPAP (if she is not currently) to help with her oxygen at night. I think she stopped smoking years ago? Please ask. Also, she can start donating blood every 3 months to help lower the excess of red blood cells she is making. Encourage her to follow up with pulmonology also if she is having issues with her breathing or using the CPAP.

## 2022-07-12 ENCOUNTER — Ambulatory Visit (HOSPITAL_BASED_OUTPATIENT_CLINIC_OR_DEPARTMENT_OTHER): Payer: Medicare Other | Admitting: Pulmonary Disease

## 2022-07-12 ENCOUNTER — Ambulatory Visit (INDEPENDENT_AMBULATORY_CARE_PROVIDER_SITE_OTHER): Payer: Medicare Other | Admitting: Pulmonary Disease

## 2022-07-12 ENCOUNTER — Encounter (HOSPITAL_BASED_OUTPATIENT_CLINIC_OR_DEPARTMENT_OTHER): Payer: Self-pay | Admitting: Pulmonary Disease

## 2022-07-12 VITALS — BP 116/64 | HR 78 | Temp 98.4°F | Ht 61.5 in | Wt 247.8 lb

## 2022-07-12 DIAGNOSIS — J9621 Acute and chronic respiratory failure with hypoxia: Secondary | ICD-10-CM | POA: Diagnosis not present

## 2022-07-12 DIAGNOSIS — G4733 Obstructive sleep apnea (adult) (pediatric): Secondary | ICD-10-CM | POA: Diagnosis not present

## 2022-07-12 DIAGNOSIS — J441 Chronic obstructive pulmonary disease with (acute) exacerbation: Secondary | ICD-10-CM

## 2022-07-12 DIAGNOSIS — G4734 Idiopathic sleep related nonobstructive alveolar hypoventilation: Secondary | ICD-10-CM | POA: Insufficient documentation

## 2022-07-12 NOTE — Assessment & Plan Note (Signed)
She is unable to tolerate the nasal mask, my impression is that she is getting mouth dryness and some abdominal bloating. I have asked her to revert back to a full facemask. Will refer her to mask fitting session We will also refer to weight and wellness clinic  Weight loss encouraged, compliance with goal of at least 4-6 hrs every night is the expectation. Advised against medications with sedative side effects Cautioned against driving when sleepy - understanding that sleepiness will vary on a day to day basis

## 2022-07-12 NOTE — Assessment & Plan Note (Signed)
Continue on Anoro. Use albuterol as needed.

## 2022-07-12 NOTE — Progress Notes (Signed)
     Subjective:    Patient ID: Leah Lawson, female    DOB: [birth year], 66 y.o.   MRN: [id number]  HPI  66 yo ex-smoker for follow-up of COPD & OSA with nocturnal hypoxia -on CPAP + 4 L O2 She smoked about a pack per day for 40 years until she quit in 2019.  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 Pt states she has a productive cough, SOB with exertion. Pt needs to talk about her CPAP.   She was started on CPAP with a fullface mask initially.  She did not want to start on oxygen due to cost issues.  She developed rash over her face and on a follow-up visit 11/1 she was provided with DreamWear nasal mask.  She uses a couple of times and it caused her tongue to be stuck and not be able to move in the morning, she also had bloating in the morning she was scared and stopped using her machine. She saw her PCP for bacterial infection and had fluid in her years, CBC showed polycythemia with hemoglobin 16-17 range, have reviewed CBC results.  She was advised phlebotomy.  She is very anxious about all these new diagnoses  She is compliant with Anoro she has developed a new cough Chest x-ray 12/6 reviewed shows mild hyperinflation, no new infiltrates  Significant tests/ events reviewed  CPAP titration >> 7 cm + 4 L O2 HST 12/2021 AHI 62/h, lowest desat 58% PFTs 12/2020 moderate airway obstruction, ratio 57, FEV1 68%, FVC 92%, TLC 109%, DLCO 9.7/53%   CT chest WO con 06/2020 emphysema , thyroid nodule  Review of Systems neg for any significant sore throat, dysphagia, itching, sneezing, nasal congestion or excess/ purulent secretions, fever, chills, sweats, unintended wt loss, pleuritic or exertional cp, hempoptysis, orthopnea pnd or change in chronic leg swelling. Also denies presyncope, palpitations,  heartburn, abdominal pain, nausea, vomiting, diarrhea or change in bowel or urinary habits, dysuria,hematuria, rash, arthralgias, visual complaints, headache, numbness weakness or ataxia.     Objective:   Physical Exam  Gen. Pleasant, obese, in no distress, normal affect ENT - no pallor,icterus, no post nasal drip, class 2-3 airway Neck: No JVD, no thyromegaly, no carotid bruits Lungs: no use of accessory muscles, no dullness to percussion, decreased without rales or rhonchi  Cardiovascular: Rhythm regular, heart sounds  normal, no murmurs or gallops, no peripheral edema Abdomen: soft and non-tender, no hepatosplenomegaly, BS normal. Musculoskeletal: No deformities, no cyanosis or clubbing Neuro:  alert, non focal, no tremors       Assessment & Plan:

## 2022-07-12 NOTE — Telephone Encounter (Signed)
Called patient back and she has an office visi twith Dr Elsworth Soho tomorrow and she states she will address her concerns with lab work with him tomorrow. Nothing further needed

## 2022-07-12 NOTE — Assessment & Plan Note (Signed)
She wanted to avoid nocturnal oxygen due to cost issues but now with the new finding of polycythemia, she is willing to start. Will send prescription to DME to blend in 4 L of oxygen into CPAP

## 2022-07-12 NOTE — Assessment & Plan Note (Signed)
Likely multifactorial but reversible factors include nocturnal hypoxia and OSA Hopefully we can impact this with CPAP and oxygen use. She should get hematology referral

## 2022-07-12 NOTE — Patient Instructions (Signed)
  Call [phone] and make appointment for mask fitting session  Meanwhile get back on the full facemask and use Vaseline  Continue on Anoro.  X Rx to DME for 4 L oxygen blended into CPAP

## 2022-07-17 ENCOUNTER — Telehealth: Payer: Self-pay | Admitting: Family Medicine

## 2022-07-17 NOTE — Telephone Encounter (Signed)
LVM for pt to rtn my call to schedule AWV-I with [facility] call back # [phone] 

## 2022-07-30 ENCOUNTER — Other Ambulatory Visit: Payer: Self-pay | Admitting: Adult Health

## 2022-08-13 ENCOUNTER — Ambulatory Visit (INDEPENDENT_AMBULATORY_CARE_PROVIDER_SITE_OTHER): Payer: BLUE CROSS/BLUE SHIELD

## 2022-08-13 VITALS — Ht 61.5 in | Wt 246.0 lb

## 2022-08-13 DIAGNOSIS — Z0000 Encounter for general adult medical examination without abnormal findings: Secondary | ICD-10-CM | POA: Diagnosis not present

## 2022-08-13 NOTE — Progress Notes (Signed)
Virtual Visit via Telephone Note  I connected with  Leah Lawson on 08/13/22 at  1:00 PM EST by telephone and verified that I am speaking with the correct person using two identifiers.  Location: Patient: Home Provider: [place] Persons participating in the virtual visit: [place]   I discussed the limitations, risks, security and privacy concerns of performing an evaluation and management service by telephone and the availability of in person appointments. The patient expressed understanding and agreed to proceed.  Interactive audio and video telecommunications were attempted between this nurse and patient, however failed, due to patient having technical difficulties OR patient did not have access to video capability.  We continued and completed visit with audio only.  Some vital signs may be absent or patient reported.   Sheral Flow, LPN  Subjective:   Leah Lawson is a 67 y.o. female who presents for an Initial Medicare Annual Wellness Visit.  Review of Systems     Cardiac Risk Factors include: advanced age (>44mn, >>44women);dyslipidemia;diabetes mellitus;hypertension;family history of premature cardiovascular disease;obesity (BMI >30kg/m2)     Objective:    Today's Vitals   08/13/22 1302  Weight: 246 lb (111.6 kg)  Height: 5' 1.5" (1.562 m)  PainSc: 0-No pain   Body mass index is 45.73 kg/m.     08/13/2022    1:05 PM 02/28/2022    8:25 PM 03/13/2020    6:17 PM 11/25/2019   10:47 PM 05/09/2018    4:27 AM 05/08/2018    5:09 PM  Advanced Directives  Does Patient Have a Medical Advance Directive? No No No No  No  Would patient like information on creating a medical advance directive? No - Patient declined No - Patient declined No - Patient declined No - Patient declined No - Patient declined     Current Medications (verified) Outpatient Encounter Medications as of 08/13/2022  Medication Sig   albuterol (VENTOLIN HFA) 108 (90 Base)  MCG/ACT inhaler INHALE 2 PUFFS BY MOUTH EVERY 6 HOURS AS NEEDED FOR WHEEZING FOR SHORTNESS OF BREATH   ANORO ELLIPTA 62.5-25 MCG/ACT AEPB Inhale 1 puff by mouth once daily   benzonatate (TESSALON) 200 MG capsule Take 1 capsule (200 mg total) by mouth 2 (two) times daily as needed for cough.   COD LIVER OIL PO Take by mouth.   dapagliflozin propanediol (FARXIGA) 5 MG TABS tablet TAKE 1 TABLET BY MOUTH ONCE DAILY BEFORE BREAKFAST   losartan-hydrochlorothiazide (HYZAAR) 100-25 MG tablet Take 1 tablet by mouth daily.   spironolactone (ALDACTONE) 25 MG tablet Take 1 tablet (25 mg total) by mouth daily.   triamcinolone cream (KENALOG) 0.1 % Apply 1 Application topically 2 (two) times daily.   No facility-administered encounter medications on file as of 08/13/2022.    Allergies (verified) Hydralazine, Lisinopril, Singulair [montelukast sodium], and Mucinex [guaifenesin er]   History: Past Medical History:  Diagnosis Date   Asthma    COPD (chronic obstructive pulmonary disease) ([facility]    Diabetes mellitus without complication ([facility]    "pre-diabetic"   Hypertension    Hypertensive retinopathy 10/26/2021   Open-angle glaucoma 10/26/2021   Past Surgical History:  Procedure Laterality Date   BREAST SURGERY     Family History  Problem Relation Age of Onset   Heart failure Mother    Arthritis Mother    Diabetes Mother    High blood pressure Mother    Cancer Father        prostate, lung   COPD Father  High Cholesterol Father    High blood pressure Father    Dementia Father    Diabetes Brother    High blood pressure Brother    Stroke Brother    Cancer Maternal Grandfather    Miscarriages / Stillbirths Daughter    Breast cancer Cousin    Social History   Socioeconomic History   Marital status: Single    Spouse name: Not on file   Number of children: Not on file   Years of education: Not on file   Highest education level: Not on file  Occupational History   Not on file  Tobacco  Use   Smoking status: Former    Packs/day: 0.50    Years: 45.00    Total pack years: 22.50    Types: Cigarettes    Quit date: 05/05/2018    Years since quitting: 4.2    Passive exposure: Past   Smokeless tobacco: Never  Vaping Use   Vaping Use: Never used  Substance and Sexual Activity   Alcohol use: Not Currently   Drug use: Never   Sexual activity: Not on file  Other Topics Concern   Not on file  Social History Narrative   Not on file   Social Determinants of Health   Financial Resource Strain: Not on file  Food Insecurity: Not on file  Transportation Needs: Not on file  Physical Activity: Not on file  Stress: Not on file  Social Connections: Not on file    Tobacco Counseling Counseling given: Not Answered   Clinical Intake:  Pre-visit preparation completed: Yes  Pain : No/denies pain Pain Score: 0-No pain     BMI - recorded: 45.73 Nutritional Risks: None Diabetes: No  How often do you need to have someone help you when you read instructions, pamphlets, or other written materials from your doctor or pharmacy?: 1 - Never What is the last grade level you completed in school?: Master's Degree 2016  Nutrition Risk Assessment:  Has the patient had any N/V/D within the last 2 months?  No  Does the patient have any non-healing wounds?  No  Has the patient had any unintentional weight loss or weight gain?  No   Diabetes:  Is the patient diabetic?  Yes  If diabetic, was a CBG obtained today?  No  Did the patient bring in their glucometer from home?  No  How often do you monitor your CBG's? No.   Financial Strains and Diabetes Management:  Are you having any financial strains with the device, your supplies or your medication? No .  Does the patient want to be seen by Chronic Care Management for management of their diabetes?  No  Would the patient like to be referred to a Nutritionist or for Diabetic Management?  No   Diabetic Exams:  Diabetic Eye Exam:  Completed 02/19/2022 Diabetic Foot Exam: Overdue, Pt has been advised about the importance in completing this exam. Pt is scheduled for diabetic foot exam on 08/14/2022.   Interpreter Needed?: No  Information entered by :: Lisette Abu, LPN.   Activities of Daily Living    08/13/2022    1:11 PM  In your present state of health, do you have any difficulty performing the following activities:  Hearing? 0  Vision? 0  Difficulty concentrating or making decisions? 0  Walking or climbing stairs? 0  Dressing or bathing? 0  Doing errands, shopping? 0  Preparing Food and eating ? N  Using the Toilet? N  In the  past six months, have you accidently leaked urine? N  Do you have problems with loss of bowel control? N  Managing your Medications? N  Managing your Finances? N  Housekeeping or managing your Housekeeping? N    Patient Care Team: Girtha Rm, NP-C as PCP - General (Family Medicine) Juluis Rainier as Consulting Physician (Optometry)  Indicate any recent Medical Services you may have received from other than [facility] providers in the past year (date may be approximate).     Assessment:   This is a routine wellness examination for Leah Lawson.  Hearing/Vision screen Hearing Screening - Comments:: Denies hearing difficulties   Vision Screening - Comments:: Wears rx glasses - up to date with routine eye exams with Dr. Alois Cliche   Dietary issues and exercise activities discussed: Current Exercise Habits: Home exercise routine, Type of exercise: walking, Time (Minutes): 60, Frequency (Times/Week): 5, Weekly Exercise (Minutes/Week): 300, Intensity: Moderate   Goals Addressed             This Visit's Progress    My goal for 2024 is to get back into the [facility].        Depression Screen    08/13/2022    1:10 PM 01/31/2022   10:28 AM 10/18/2021   10:31 AM  PHQ 2/9 Scores  PHQ - 2 Score 0 0 0    Fall Risk    08/13/2022    1:06 PM 01/31/2022   10:29 AM 10/18/2021   10:31 AM   [place] in the past year? 0 0 0  Number falls in past yr: 0 0 0  Injury with Fall? 0 0 0  Risk for fall due to : No Fall Risks No Fall Risks   Follow up Falls prevention discussed Falls evaluation completed     FALL RISK PREVENTION PERTAINING TO THE HOME:  Any stairs in or around the home? No  If so, are there any without handrails? No  Home free of loose throw rugs in walkways, pet beds, electrical cords, etc? No  Adequate lighting in your home to reduce risk of falls? Yes   ASSISTIVE DEVICES UTILIZED TO PREVENT FALLS:  Life alert? Yes  Use of a cane, walker or w/c? No  Grab bars in the bathroom? Yes  Shower chair or bench in shower? Yes  Elevated toilet seat or a handicapped toilet? Yes   TIMED UP AND GO:  Was the test performed? No . Phone Visit  Cognitive Function:        08/13/2022    1:11 PM  6CIT Screen  What Year? 0 points  What month? 0 points  What time? 0 points  Count back from 20 0 points  Months in reverse 0 points  Repeat phrase 0 points  Total Score 0 points    Immunizations Immunization History  Administered Date(s) Administered   Fluad Quad(high Dose 65+) 06/05/2022   Influenza,inj,Quad PF,6+ Mos 05/10/2018   PFIZER(Purple Top)SARS-COV-2 Vaccination 01/24/2020, 02/14/2020   Pfizer Covid-19 Vaccine Bivalent Booster 62yr & up 09/02/2020, 10/30/2021   Pneumococcal Polysaccharide-23 05/10/2018   Tdap 08/21/2007    TDAP status: Due, Education has been provided regarding the importance of this vaccine. Advised may receive this vaccine at local pharmacy or Health Dept. Aware to provide a copy of the vaccination record if obtained from local pharmacy or Health Dept. Verbalized acceptance and understanding.  Flu Vaccine status: Up to date  Pneumococcal vaccine status: Due, Education has been provided regarding the importance  of this vaccine. Advised may receive this vaccine at local pharmacy or Health Dept. Aware to provide a copy of the  vaccination record if obtained from local pharmacy or Health Dept. Verbalized acceptance and understanding.  Covid-19 vaccine status: Completed vaccines  Qualifies for Shingles Vaccine? Yes   Zostavax completed No   Shingrix Completed?: No.    Education has been provided regarding the importance of this vaccine. Patient has been advised to call insurance company to determine out of pocket expense if they have not yet received this vaccine. Advised may also receive vaccine at local pharmacy or Health Dept. Verbalized acceptance and understanding.  Screening Tests Health Maintenance  Topic Date Due   FOOT EXAM  Never done   Diabetic kidney evaluation - Urine ACR  Never done   COLONOSCOPY (Pts 45-81yr Insurance coverage will need to be confirmed)  Never done   DTaP/Tdap/Td (2 - Td or Tdap) 08/20/2017   Pneumonia Vaccine 68 Years old (2 - PCV) 05/11/2019   Lung Cancer Screening  06/05/2021   COVID-19 Vaccine (5 - 2023-24 season) 04/05/2022   Zoster Vaccines- Shingrix (1 of 2) 10/09/2022 (Originally 03/08/2005)   HEMOGLOBIN A1C  09/13/2022   OPHTHALMOLOGY EXAM  02/20/2023   Diabetic kidney evaluation - eGFR measurement  02/22/2023   MAMMOGRAM  06/04/2024   INFLUENZA VACCINE  Completed   DEXA SCAN  Completed   Hepatitis C Screening  Completed   HPV VACCINES  Aged Out    Health Maintenance  Health Maintenance Due  Topic Date Due   FOOT EXAM  Never done   Diabetic kidney evaluation - Urine ACR  Never done   COLONOSCOPY (Pts 45-448yrInsurance coverage will need to be confirmed)  Never done   DTaP/Tdap/Td (2 - Td or Tdap) 08/20/2017   Pneumonia Vaccine 6576Years old (2 - PCV) 05/11/2019   Lung Cancer Screening  06/05/2021   COVID-19 Vaccine (5 - 2023-24 season) 04/05/2022    Colorectal cancer screening: Patient stated Cologuard completed 1 year ago by previous pcp.  Mammogram status: Completed 06/04/2022. Repeat every year  Bone Density status: Completed 10/03/2021. Results  reflect: Bone density results: OSTEOPENIA. Repeat every 2-3 years.  Lung Cancer Screening: (Low Dose CT Chest recommended if Age 68-80ears, 30 pack-year currently smoking OR have quit w/in 15years.) does not qualify.   Lung Cancer Screening Referral: no  Additional Screening:  Hepatitis C Screening: does qualify; Completed 08/28/2010  Vision Screening: Recommended annual ophthalmology exams for early detection of glaucoma and other disorders of the eye. Is the patient up to date with their annual eye exam?  Yes  Who is the provider or what is the name of the office in which the patient attends annual eye exams? [facility]. If pt is not established with a provider, would they like to be referred to a provider to establish care? No .   Dental Screening: Recommended annual dental exams for proper oral hygiene  Community Resource Referral / Chronic Care Management: CRR required this visit?  No   CCM required this visit?  No      Plan:     I have personally reviewed and noted the following in the patient's chart:   Medical and social history Use of alcohol, tobacco or illicit drugs  Current medications and supplements including opioid prescriptions. Patient is not currently taking opioid prescriptions. Functional ability and status Nutritional status Physical activity Advanced directives List of other physicians Hospitalizations, surgeries, and ER visits in previous 12 months  Vitals Screenings to include cognitive, depression, and falls Referrals and appointments  In addition, I have reviewed and discussed with patient certain preventive protocols, quality metrics, and best practice recommendations. A written personalized care plan for preventive services as well as general preventive health recommendations were provided to patient.     Sheral Flow, LPN   04/05/2257   Nurse Notes: N/A

## 2022-08-13 NOTE — Patient Instructions (Addendum)
Leah Lawson , Thank you for taking time to come for your Medicare Wellness Visit. I appreciate your ongoing commitment to your health goals. Please review the following plan we discussed and let me know if I can assist you in the future.   These are the goals we discussed:  Goals      My goal for 2024 is to get back into the [facility].        This is a list of the screening recommended for you and due dates:  Health Maintenance  Topic Date Due   Complete foot exam   Never done   Yearly kidney health urinalysis for diabetes  Never done   Colon Cancer Screening  Never done   DTaP/Tdap/Td vaccine (2 - Td or Tdap) 08/20/2017   Pneumonia Vaccine (2 - PCV) 05/11/2019   Screening for Lung Cancer  06/05/2021   COVID-19 Vaccine (5 - 2023-24 season) 04/05/2022   Zoster (Shingles) Vaccine (1 of 2) 10/09/2022*   Hemoglobin A1C  09/13/2022   Eye exam for diabetics  02/20/2023   Yearly kidney function blood test for diabetes  02/22/2023   Mammogram  06/04/2024   Flu Shot  Completed   DEXA scan (bone density measurement)  Completed   Hepatitis C Screening: USPSTF Recommendation to screen - Ages 18-79 yo.  Completed   HPV Vaccine  Aged Out  *Topic was postponed. The date shown is not the original due date.    Advanced directives: No  Conditions/risks identified: Yes  Next appointment: Follow up in one year for your annual wellness visit.   Preventive Care 68 Years and Older, Female Preventive care refers to lifestyle choices and visits with your health care provider that can promote health and wellness. What does preventive care include? A yearly physical exam. This is also called an annual well check. Dental exams once or twice a year. Routine eye exams. Ask your health care provider how often you should have your eyes checked. Personal lifestyle choices, including: Daily care of your teeth and gums. Regular physical activity. Eating a healthy diet. Avoiding tobacco and drug  use. Limiting alcohol use. Practicing safe sex. Taking low-dose aspirin every day. Taking vitamin and mineral supplements as recommended by your health care provider. What happens during an annual well check? The services and screenings done by your health care provider during your annual well check will depend on your age, overall health, lifestyle risk factors, and family history of disease. Counseling  Your health care provider may ask you questions about your: Alcohol use. Tobacco use. Drug use. Emotional well-being. Home and relationship well-being. Sexual activity. Eating habits. History of falls. Memory and ability to understand (cognition). Work and work Statistician. Reproductive health. Screening  You may have the following tests or measurements: Height, weight, and BMI. Blood pressure. Lipid and cholesterol levels. These may be checked every 5 years, or more frequently if you are over 20 years old. Skin check. Lung cancer screening. You may have this screening every year starting at age 27 if you have a 30-pack-year history of smoking and currently smoke or have quit within the past 15 years. Fecal occult blood test (FOBT) of the stool. You may have this test every year starting at age 58. Flexible sigmoidoscopy or colonoscopy. You may have a sigmoidoscopy every 5 years or a colonoscopy every 10 years starting at age 70. Hepatitis C blood test. Hepatitis B blood test. Sexually transmitted disease (STD) testing. Diabetes screening. This is done by checking your  blood sugar (glucose) after you have not eaten for a while (fasting). You may have this done every 1-3 years. Bone density scan. This is done to screen for osteoporosis. You may have this done starting at age 35. Mammogram. This may be done every 1-2 years. Talk to your health care provider about how often you should have regular mammograms. Talk with your health care provider about your test results, treatment  options, and if necessary, the need for more tests. Vaccines  Your health care provider may recommend certain vaccines, such as: Influenza vaccine. This is recommended every year. Tetanus, diphtheria, and acellular pertussis (Tdap, Td) vaccine. You may need a Td booster every 10 years. Zoster vaccine. You may need this after age 4. Pneumococcal 13-valent conjugate (PCV13) vaccine. One dose is recommended after age 30. Pneumococcal polysaccharide (PPSV23) vaccine. One dose is recommended after age 52. Talk to your health care provider about which screenings and vaccines you need and how often you need them. This information is not intended to replace advice given to you by your health care provider. Make sure you discuss any questions you have with your health care provider. Document Released: 08/18/2015 Document Revised: 04/10/2016 Document Reviewed: 05/23/2015 Elsevier Interactive Patient Education  2017 [place] Prevention in the Home Falls can cause injuries. They can happen to people of all ages. There are many things you can do to make your home safe and to help prevent falls. What can I do on the outside of my home? Regularly fix the edges of walkways and driveways and fix any cracks. Remove anything that might make you trip as you walk through a door, such as a raised step or threshold. Trim any bushes or trees on the path to your home. Use bright outdoor lighting. Clear any walking paths of anything that might make someone trip, such as rocks or tools. Regularly check to see if handrails are loose or broken. Make sure that both sides of any steps have handrails. Any raised decks and porches should have guardrails on the edges. Have any leaves, snow, or ice cleared regularly. Use sand or salt on walking paths during winter. Clean up any spills in your garage right away. This includes oil or grease spills. What can I do in the bathroom? Use night lights. Install grab  bars by the toilet and in the tub and shower. Do not use towel bars as grab bars. Use non-skid mats or decals in the tub or shower. If you need to sit down in the shower, use a plastic, non-slip stool. Keep the floor dry. Clean up any water that spills on the floor as soon as it happens. Remove soap buildup in the tub or shower regularly. Attach bath mats securely with double-sided non-slip rug tape. Do not have throw rugs and other things on the floor that can make you trip. What can I do in the bedroom? Use night lights. Make sure that you have a light by your bed that is easy to reach. Do not use any sheets or blankets that are too big for your bed. They should not hang down onto the floor. Have a firm chair that has side arms. You can use this for support while you get dressed. Do not have throw rugs and other things on the floor that can make you trip. What can I do in the kitchen? Clean up any spills right away. Avoid walking on wet floors. Keep items that you use a lot in  easy-to-reach places. If you need to reach something above you, use a strong step stool that has a grab bar. Keep electrical cords out of the way. Do not use floor polish or wax that makes floors slippery. If you must use wax, use non-skid floor wax. Do not have throw rugs and other things on the floor that can make you trip. What can I do with my stairs? Do not leave any items on the stairs. Make sure that there are handrails on both sides of the stairs and use them. Fix handrails that are broken or loose. Make sure that handrails are as long as the stairways. Check any carpeting to make sure that it is firmly attached to the stairs. Fix any carpet that is loose or worn. Avoid having throw rugs at the top or bottom of the stairs. If you do have throw rugs, attach them to the floor with carpet tape. Make sure that you have a light switch at the top of the stairs and the bottom of the stairs. If you do not have them,  ask someone to add them for you. What else can I do to help prevent falls? Wear shoes that: Do not have high heels. Have rubber bottoms. Are comfortable and fit you well. Are closed at the toe. Do not wear sandals. If you use a stepladder: Make sure that it is fully opened. Do not climb a closed stepladder. Make sure that both sides of the stepladder are locked into place. Ask someone to hold it for you, if possible. Clearly mark and make sure that you can see: Any grab bars or handrails. First and last steps. Where the edge of each step is. Use tools that help you move around (mobility aids) if they are needed. These include: Canes. Walkers. Scooters. Crutches. Turn on the lights when you go into a dark area. Replace any light bulbs as soon as they burn out. Set up your furniture so you have a clear path. Avoid moving your furniture around. If any of your floors are uneven, fix them. If there are any pets around you, be aware of where they are. Review your medicines with your doctor. Some medicines can make you feel dizzy. This can increase your chance of falling. Ask your doctor what other things that you can do to help prevent falls. This information is not intended to replace advice given to you by your health care provider. Make sure you discuss any questions you have with your health care provider. Document Released: 05/18/2009 Document Revised: 12/28/2015 Document Reviewed: 08/26/2014 Elsevier Interactive Patient Education  2017 [employer].

## 2022-08-14 ENCOUNTER — Ambulatory Visit (INDEPENDENT_AMBULATORY_CARE_PROVIDER_SITE_OTHER): Payer: Medicare Other | Admitting: Family Medicine

## 2022-08-14 ENCOUNTER — Encounter: Payer: Self-pay | Admitting: Family Medicine

## 2022-08-14 ENCOUNTER — Ambulatory Visit (HOSPITAL_BASED_OUTPATIENT_CLINIC_OR_DEPARTMENT_OTHER): Payer: Medicare Other | Attending: Pulmonary Disease | Admitting: Radiology

## 2022-08-14 VITALS — BP 130/84 | HR 85 | Temp 97.6°F | Ht 61.5 in | Wt 249.0 lb

## 2022-08-14 DIAGNOSIS — Z1211 Encounter for screening for malignant neoplasm of colon: Secondary | ICD-10-CM

## 2022-08-14 DIAGNOSIS — Z5320 Procedure and treatment not carried out because of patient's decision for unspecified reasons: Secondary | ICD-10-CM

## 2022-08-14 DIAGNOSIS — E559 Vitamin D deficiency, unspecified: Secondary | ICD-10-CM

## 2022-08-14 DIAGNOSIS — M5136 Other intervertebral disc degeneration, lumbar region: Secondary | ICD-10-CM

## 2022-08-14 DIAGNOSIS — E1169 Type 2 diabetes mellitus with other specified complication: Secondary | ICD-10-CM | POA: Diagnosis not present

## 2022-08-14 DIAGNOSIS — E042 Nontoxic multinodular goiter: Secondary | ICD-10-CM

## 2022-08-14 DIAGNOSIS — G4733 Obstructive sleep apnea (adult) (pediatric): Secondary | ICD-10-CM | POA: Diagnosis not present

## 2022-08-14 DIAGNOSIS — D751 Secondary polycythemia: Secondary | ICD-10-CM | POA: Diagnosis not present

## 2022-08-14 DIAGNOSIS — E785 Hyperlipidemia, unspecified: Secondary | ICD-10-CM | POA: Diagnosis not present

## 2022-08-14 DIAGNOSIS — M5432 Sciatica, left side: Secondary | ICD-10-CM | POA: Diagnosis not present

## 2022-08-14 DIAGNOSIS — I700 Atherosclerosis of aorta: Secondary | ICD-10-CM | POA: Diagnosis not present

## 2022-08-14 DIAGNOSIS — E669 Obesity, unspecified: Secondary | ICD-10-CM | POA: Diagnosis not present

## 2022-08-14 LAB — COMPREHENSIVE METABOLIC PANEL
ALT: 10 U/L (ref 0–35)
AST: 11 U/L (ref 0–37)
Albumin: 4.1 g/dL (ref 3.5–5.2)
Alkaline Phosphatase: 72 U/L (ref 39–117)
BUN: 22 mg/dL (ref 6–23)
CO2: 34 mEq/L — ABNORMAL HIGH (ref 19–32)
Calcium: 9 mg/dL (ref 8.4–10.5)
Chloride: 101 mEq/L (ref 96–112)
Creatinine, Ser: 0.86 mg/dL (ref 0.40–1.20)
GFR: 69.9 mL/min (ref >60.00)
Glucose, Bld: 132 mg/dL — ABNORMAL HIGH (ref 70–99)
Potassium: 4.5 mEq/L (ref 3.5–5.1)
Sodium: 143 mEq/L (ref 135–145)
Total Bilirubin: 0.7 mg/dL (ref 0.2–1.2)
Total Protein: 6.7 g/dL (ref 6.0–8.3)

## 2022-08-14 LAB — CBC WITH DIFFERENTIAL/PLATELET
Basophils Absolute: 0 10*3/uL (ref 0.0–0.1)
Basophils Relative: 0.7 % (ref 0.0–3.0)
Eosinophils Absolute: 0.1 10*3/uL (ref 0.0–0.7)
Eosinophils Relative: 2 % (ref 0.0–5.0)
HCT: 49.7 % — ABNORMAL HIGH (ref 36.0–46.0)
Hemoglobin: 16.5 g/dL — ABNORMAL HIGH (ref 12.0–15.0)
Lymphocytes Relative: 27.3 % (ref 12.0–46.0)
Lymphs Abs: 1.6 10*3/uL (ref 0.7–4.0)
MCHC: 33.1 g/dL (ref 30.0–36.0)
MCV: 92.6 fl (ref 78.0–100.0)
Monocytes Absolute: 0.5 10*3/uL (ref 0.1–1.0)
Monocytes Relative: 8.6 % (ref 3.0–12.0)
Neutro Abs: 3.6 10*3/uL (ref 1.4–7.7)
Neutrophils Relative %: 61.4 % (ref 43.0–77.0)
Platelets: 260 10*3/uL (ref 150.0–400.0)
RBC: 5.37 Mil/uL — ABNORMAL HIGH (ref 3.87–5.11)
RDW: 14.2 % (ref 11.5–15.5)
WBC: 5.8 10*3/uL (ref 4.0–10.5)

## 2022-08-14 LAB — TSH: TSH: 1.07 u[IU]/mL (ref 0.35–5.50)

## 2022-08-14 LAB — MICROALBUMIN / CREATININE URINE RATIO
Creatinine,U: 141.5 mg/dL
Microalb Creat Ratio: 0.5 mg/g (ref 0.0–30.0)
Microalb, Ur: <0.7 mg/dL (ref 0.0–1.9)

## 2022-08-14 LAB — LIPID PANEL
Cholesterol: 168 mg/dL (ref 0–200)
HDL: 37.1 mg/dL — ABNORMAL LOW (ref >39.00)
LDL Cholesterol: 108 mg/dL — ABNORMAL HIGH (ref 0–99)
NonHDL: 131.18
Total CHOL/HDL Ratio: 5
Triglycerides: 115 mg/dL (ref 0.0–149.0)
VLDL: 23 mg/dL (ref 0.0–40.0)

## 2022-08-14 LAB — T4, FREE: Free T4: 0.88 ng/dL (ref 0.60–1.60)

## 2022-08-14 LAB — HEMOGLOBIN A1C: Hgb A1c MFr Bld: 7.7 % — ABNORMAL HIGH (ref 4.6–6.5)

## 2022-08-14 LAB — VITAMIN D 25 HYDROXY (VIT D DEFICIENCY, FRACTURES): VITD: 19.68 ng/mL — ABNORMAL LOW (ref 30.00–100.00)

## 2022-08-14 NOTE — Patient Instructions (Addendum)
Please go downstairs for labs and a urine check before you leave today.  You will hear from Ortho care regarding your sciatica and history of low back pain with degenerative disc disease.  You will hear from [facility] imaging to schedule your thyroid ultrasound.  You will hear from [facility] gastroenterology to schedule your colonoscopy.  I recommend that you see [facility] in March as scheduled.  We will be in touch with your lab results and recommendations.  Continue to follow-up with the hypertension clinic for your blood pressure medications.  Continue to follow-up with pulmonology for your inhalers and lung condition as well as sleep apnea.
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
## 2022-08-15 ENCOUNTER — Other Ambulatory Visit: Payer: Self-pay | Admitting: Family Medicine

## 2022-08-15 ENCOUNTER — Other Ambulatory Visit: Payer: Self-pay | Admitting: *Deleted

## 2022-08-15 DIAGNOSIS — E559 Vitamin D deficiency, unspecified: Secondary | ICD-10-CM

## 2022-08-15 DIAGNOSIS — D751 Secondary polycythemia: Secondary | ICD-10-CM

## 2022-08-15 MED ORDER — VITAMIN D (ERGOCALCIFEROL) 1.25 MG (50000 UNIT) PO CAPS: 50000.0000 [IU] | ORAL_CAPSULE | ORAL | 0 refills | Status: DC

## 2022-08-15 MED ORDER — ANORO ELLIPTA 62.5-25 MCG/ACT IN AEPB: 1.0000 | INHALATION_SPRAY | Freq: Every day | RESPIRATORY_TRACT | 5 refills | Status: DC

## 2022-08-15 NOTE — Telephone Encounter (Signed)
From: [name] To: Office of Rexene Edison, NP Sent: 08/14/2022 9:30 PM EST Subject: Medication Renewal Request  Refills have been requested for the following medications:   ANORO ELLIPTA 62.5-25 MCG/ACT AEPB [Tammy Parrett]  Preferred pharmacy: [facility], [place]. Delivery method: [employer]

## 2022-08-15 NOTE — Progress Notes (Signed)
Please let her know that I referred her to hematology because of her elevated hemoglobin. We discussed this at her visit. Also, I prescribed a high dose vitamin D for her to take once weekly for the next 12 weeks. Her diabetes is stable with a Hgb A1c 7.7%. ok to refill any medications at this point and I will see her back in 4 months for a diabetes check.

## 2022-08-16 ENCOUNTER — Other Ambulatory Visit: Payer: Self-pay | Admitting: Pulmonary Disease

## 2022-08-21 ENCOUNTER — Encounter: Payer: Self-pay | Admitting: Physical Medicine and Rehabilitation

## 2022-08-21 ENCOUNTER — Ambulatory Visit (INDEPENDENT_AMBULATORY_CARE_PROVIDER_SITE_OTHER): Payer: Medicare Other | Admitting: Physical Medicine and Rehabilitation

## 2022-08-21 VITALS — BP 116/67 | HR 89

## 2022-08-21 DIAGNOSIS — M5416 Radiculopathy, lumbar region: Secondary | ICD-10-CM | POA: Diagnosis not present

## 2022-08-21 DIAGNOSIS — M4726 Other spondylosis with radiculopathy, lumbar region: Secondary | ICD-10-CM | POA: Diagnosis not present

## 2022-08-21 DIAGNOSIS — M47816 Spondylosis without myelopathy or radiculopathy, lumbar region: Secondary | ICD-10-CM | POA: Diagnosis not present

## 2022-08-21 NOTE — Progress Notes (Signed)
Functional Pain Scale - descriptive words and definitions  Moderate (4)   Constantly aware of pain, can complete ADLs with modification/sleep marginally affected at times/passive distraction is of no use, but active distraction gives some relief. Moderate range order  Average Pain 4   +Driver, -BT, -Dye Allergies.   Left-sided LBP that radiates down the lateral left leg to mid-thigh. "Hits" her at times, not constant. Lasts about 10-15 minutes, and gets better after rest. Occasional throbbing in the leg at night in bed. No relief with Tylenol nor Aleve.

## 2022-08-21 NOTE — Progress Notes (Signed)
   08/21/22 [place] in the past year? 0  Number of falls in past year 0  Was there an injury with Fall? 0  Fall Risk Category Calculator 0  Patient Fall Risk Level Low Fall Risk  Fall Risk  Patient at Risk for Falls Due to No Fall Risks  Fall risk Follow up Falls prevention discussed
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
## 2022-09-03 ENCOUNTER — Ambulatory Visit
Admission: RE | Admit: 2022-09-03 | Discharge: 2022-09-03 | Disposition: A | Discharge status: Home / Self Care | Payer: Medicare Other | Source: Ambulatory Visit | Attending: Family Medicine | Admitting: Family Medicine

## 2022-09-03 DIAGNOSIS — E042 Nontoxic multinodular goiter: Secondary | ICD-10-CM

## 2022-09-03 NOTE — Progress Notes (Unsigned)
[place] Telephone:(336) [phone]   Fax:(336) [place] NOTE  Patient Care Team: Girtha Rm, NP-C as PCP - General (Family Medicine) Juluis Rainier as Consulting Physician (Optometry)  Hematological/Oncological History 10/18/2021: WBC 5.9, Hgb 16.1, Hct 48.8%, Plt 237 01/31/2022: WBC 5.0, Hgb 16.3, Hct 49.9%, Plt 239 07/10/2022: WBC 5.6, Hgb 17.7, Hct 52.5%, Plt 286 08/14/2022: WBC 5.8, Hgb 16.5, Hct 49.7%, Plt 260 09/04/2022: Establish care with [facility] Hematology  CHIEF COMPLAINTS/PURPOSE OF CONSULTATION:  "Polycythemia "  HISTORY OF PRESENTING ILLNESS:  Maryuri Fromme 67 y.o. female with medical history significant for COPD, HTN, DM and OSA presents to the hematology clinic for evaluation of polycythemia. She is unaccompanied for this visit.   On exam today, Ms. Lanigan reports she does have more pronounced fatigue than her baseline but she is able to complete her ADLs on her own. She has a good appetite and weight has been stable over the last several months. She denies nausea, vomiting or abdominal pain. Bowel habits are unchanged without recurrent episodes of diarrhea or constipation. She does have occasional headaches which is chronic in nature. She was diagnosed with OSA and has been using her CPAP machine for approximately 6 months. She adds that she is on 2 L of supplemental oxygen at night. She denies fevers, chills, sweats, chest pain or cough. She has no other complaints.   MEDICAL HISTORY:  Past Medical History:  Diagnosis Date   Asthma    COPD (chronic obstructive pulmonary disease) ([place])    Diabetes mellitus without complication ([place])    "pre-diabetic"   Hypertension    Hypertensive retinopathy 10/26/2021   Open-angle glaucoma 10/26/2021    SURGICAL HISTORY: Past Surgical History:  Procedure Laterality Date   BREAST SURGERY      SOCIAL HISTORY: Social History   Socioeconomic History   Marital status: Single    Spouse name:  Not on file   Number of children: Not on file   Years of education: Not on file   Highest education level: Not on file  Occupational History   Not on file  Tobacco Use   Smoking status: Former    Packs/day: 0.50    Years: 45.00    Total pack years: 22.50    Types: Cigarettes    Quit date: 05/05/2018    Years since quitting: 4.3    Passive exposure: Past   Smokeless tobacco: Never  Vaping Use   Vaping Use: Never used  Substance and Sexual Activity   Alcohol use: Not Currently   Drug use: Never   Sexual activity: Not on file  Other Topics Concern   Not on file  Social History Narrative   Not on file   Social Determinants of Health   Financial Resource Strain: Not on file  Food Insecurity: Not on file  Transportation Needs: Not on file  Physical Activity: Not on file  Stress: Not on file  Social Connections: Not on file  Intimate Partner Violence: Not on file    FAMILY HISTORY: Family History  Problem Relation Age of Onset   Heart failure Mother    Arthritis Mother    Diabetes Mother    High blood pressure Mother    Cancer Father        prostate, lung   COPD Father    High Cholesterol Father    High blood pressure Father    Dementia Father    Diabetes Brother    High blood pressure Brother  Stroke Brother    Cancer Maternal Grandfather    Miscarriages / Stillbirths Daughter    Breast cancer Cousin     ALLERGIES:  is allergic to hydralazine, lisinopril, singulair [montelukast sodium], and mucinex [guaifenesin er].  MEDICATIONS:  Current Outpatient Medications  Medication Sig Dispense Refill   albuterol (VENTOLIN HFA) 108 (90 Base) MCG/ACT inhaler INHALE 2 PUFFS BY MOUTH EVERY 6 HOURS AS NEEDED FOR WHEEZING FOR SHORTNESS OF BREATH 9 g 0   COD LIVER OIL PO Take by mouth.     dapagliflozin propanediol (FARXIGA) 5 MG TABS tablet TAKE 1 TABLET BY MOUTH ONCE DAILY BEFORE BREAKFAST 30 tablet 2   losartan-hydrochlorothiazide (HYZAAR) 100-25 MG tablet Take 1  tablet by mouth daily. 90 tablet 1   spironolactone (ALDACTONE) 25 MG tablet Take 1 tablet (25 mg total) by mouth daily. 90 tablet 3   triamcinolone cream (KENALOG) 0.1 % Apply 1 Application topically 2 (two) times daily. 30 g 0   umeclidinium-vilanterol (ANORO ELLIPTA) 62.5-25 MCG/ACT AEPB Inhale 1 puff into the lungs daily. 60 each 5   Vitamin D, Ergocalciferol, (DRISDOL) 1.25 MG (50000 UNIT) CAPS capsule Take 1 capsule (50,000 Units total) by mouth every 7 (seven) days. 12 capsule 0   No current facility-administered medications for this visit.    REVIEW OF SYSTEMS:   Constitutional: ( - ) fevers, ( - )  chills , ( - ) night sweats Eyes: ( - ) blurriness of vision, ( - ) double vision, ( - ) watery eyes Ears, nose, mouth, throat, and face: ( - ) mucositis, ( - ) sore throat Respiratory: ( - ) cough, ( - ) dyspnea, ( - ) wheezes Cardiovascular: ( - ) palpitation, ( - ) chest discomfort, ( - ) lower extremity swelling Gastrointestinal:  ( - ) nausea, ( - ) heartburn, ( - ) change in bowel habits Skin: ( - ) abnormal skin rashes Lymphatics: ( - ) new lymphadenopathy, ( - ) easy bruising Neurological: ( - ) numbness, ( - ) tingling, ( - ) new weaknesses Behavioral/Psych: ( - ) mood change, ( - ) new changes  All other systems were reviewed with the patient and are negative.  PHYSICAL EXAMINATION: ECOG PERFORMANCE STATUS: 1 - Symptomatic but completely ambulatory  Vitals:   09/04/22 1117  BP: 107/77  Pulse: 99  Resp: 16  Temp: 97.7 F (36.5 C)  SpO2: 91%   Filed Weights   09/04/22 1117  Weight: 248 lb 11.2 oz (112.8 kg)    GENERAL: well appearing female in NAD  SKIN: skin color, texture, turgor are normal, no rashes or significant lesions EYES: conjunctiva are pink and non-injected, sclera clear OROPHARYNX: no exudate, no erythema; lips, buccal mucosa, and tongue normal  NECK: supple, non-tender LYMPH:  no palpable lymphadenopathy in the cervical or supraclavicular lymph  nodes.  LUNGS: clear to auscultation and percussion with normal breathing effort HEART: regular rate & rhythm and no murmurs and no lower extremity edema ABDOMEN: soft, non-tender, non-distended, normal bowel sounds Musculoskeletal: no cyanosis of digits and no clubbing  PSYCH: alert & oriented x 3, fluent speech NEURO: no focal motor/sensory deficits  LABORATORY DATA:  I have reviewed the data as listed    Latest Ref Rng & Units 09/04/2022    1:32 PM 08/14/2022   10:51 AM 07/10/2022    9:09 AM  CBC  WBC 4.0 - 10.5 K/uL 7.0  5.8  5.6   Hemoglobin 12.0 - 15.0 g/dL 16.8  16.5  17.7  Hematocrit 36.0 - 46.0 % 48.6  49.7  52.5   Platelets 150 - 400 K/uL 236  260.0  286.0        Latest Ref Rng & Units 09/04/2022    1:32 PM 08/14/2022   10:51 AM 02/21/2022   11:12 AM  CMP  Glucose 70 - 99 mg/dL 96  132  119   BUN 8 - 23 mg/dL '26  22  23   '$ Creatinine 0.44 - 1.00 mg/dL 0.87  0.86  0.84   Sodium 135 - 145 mmol/L 140  143  140   Potassium 3.5 - 5.1 mmol/L 4.3  4.5  4.7   Chloride 98 - 111 mmol/L 105  101  100   CO2 22 - 32 mmol/L 28  34  23   Calcium 8.9 - 10.3 mg/dL 10.1  9.0  10.0   Total Protein 6.5 - 8.1 g/dL 7.4  6.7    Total Bilirubin 0.3 - 1.2 mg/dL 0.6  0.7    Alkaline Phos 38 - 126 U/L 69  72    AST 15 - 41 U/L 12  11    ALT 0 - 44 U/L 11  10    RADIOGRAPHIC STUDIES: I have personally reviewed the radiological images as listed and agreed with the findings in the report. US THYROID  Result Date: 09/03/2022 CLINICAL DATA:  Nodules, follow-up EXAM: THYROID ULTRASOUND TECHNIQUE: Ultrasound examination of the thyroid gland and adjacent soft tissues was performed. COMPARISON:  07/24/2020 FINDINGS: Parenchymal Echotexture: Markedly heterogenous Isthmus: 0.7 cm thickness, stable Right lobe: 4.3 x 2.1 x 2.2 cm, previously 5.1 x 2 x 2 Left lobe: 3.9 x 1.4 x 1.8 cm, previously 3.7 x 1.6 x 2 _________________________________________________________ Estimated total number of nodules >/= 1  cm: 2 Number of spongiform nodules >/=  2 cm not described below (TR1): 0 Number of mixed cystic and solid nodules >/= 1.5 cm not described below ([place]): 0 _________________________________________________________ Nodule # 1: Prior biopsy: No Location: Isthmus; left of midline Maximum size: 1.3 cm; Other 2 dimensions: 0.8 x 1.1 cm, previously, 1.2 x 0.7 x 0.9 cm Composition: solid/almost completely solid (2) Echogenicity: hypoechoic (2) Shape: not taller-than-wide (0) Margins: smooth (0) Echogenic foci: none (0) ACR TI-RADS total points: 4. ACR TI-RADS risk category:  TR 4. Significant change in size (>/= 20% in two dimensions and minimal increase of 2 mm): No Change in features: No Change in ACR TI-RADS risk category: No ACR TI-RADS recommendations: *Given size (>/= 1 - 1.4 cm) and appearance, a follow-up ultrasound in 1 year should be considered based on TI-RADS criteria. _________________________________________________________ Nodule # 2: Prior biopsy: No Location: Left; inferior Maximum size: 2 cm; Other 2 dimensions: 1.2 x 1.4 cm, previously, 1.8 x 1.3 x 1.5 cm Composition: solid/almost completely solid (2) Echogenicity: isoechoic (1) Shape: not taller-than-wide (0) Margins: ill-defined (0) Echogenic foci: none (0) ACR TI-RADS total points: 3. ACR TI-RADS risk category:  TR 3. Significant change in size (>/= 20% in two dimensions and minimal increase of 2 mm): No Change in features: No Change in ACR TI-RADS risk category: No ACR TI-RADS recommendations: *Given size (>/= 1.5 - 2.4 cm) and appearance, a follow-up ultrasound in 1 year should be considered based on TI-RADS criteria. _________________________________________________________ No regional cervical adenopathy identified. IMPRESSION: Stable thyroid nodules.  Neither meets criteria for biopsy. Recommend annual/biennial ultrasound follow-up of nodule as above, until stability x5 years confirmed. The above is in keeping with the ACR TI-RADS recommendations  - J Am  Coll Radiol 2703;[id number]. Electronically Signed   By: Lucrezia Europe M.D.   On: 09/03/2022 19:50    ASSESSMENT & PLAN Arthi Quijas is a 67 y.o. female who presents to the hematology clinic for evaluation of polycythemia.   There are two types of polycythemia, Primary polycythemia and secondary polycythemia. Primary polycythemia is overproduction of red blood cells due to a driver mutation. The most common mutation is the JAK2 V617F (95% of cases), but there are other mutations which can cause this disorder. Primary polycythemia is a myeloproliferative neoplasm which may require cytoreductive therapy to decrease risk of thrombosis. This can consist of medications or phlebotomy to drive down the red blood cell counts. Secondary polycythemia is polycythemia driven by low oxygen levels. This represents an appropriate response of the body attempting to increase red cell volume. Causes of secondary polycythemia include smoking (most common), obstructive sleep apnea (OSA), or living at altitude. This can also be caused by testosterone supplementation. Certain thalassemias can present with marked erythrocytosis , but normal hemoglobin. Secondary polycythemia does not have the same level of thrombotic risk and therefore does not require cytoreductive therapy or phlebotomy.   #Polycythemia: --workup to include CBC, CMP, reticulocyte count, and erythropoietin level --patient is a non smoker and does not use any testosterone containing products --patient was diagnosed with OSA and has been on CPAP machine for last 6 months.  --will order MPN workup to include JAK2 with reflex and BCR/ABL FISH --RTC in 6 months or sooner if indicated by the above labs.    Orders Placed This Encounter  Procedures   CBC with Differential ([place] Only)    Standing Status:   Future    Number of Occurrences:   1    Standing Expiration Date:   09/04/2023   CMP ([place] only)    Standing Status:   Future     Number of Occurrences:   1    Standing Expiration Date:   09/04/2023   Erythropoietin    Standing Status:   Future    Number of Occurrences:   1    Standing Expiration Date:   09/03/2023   JAK2 (INCLUDING V617F AND EXON 12), MPL,& CALR W/RFL MPN PANEL ([facility])    Standing Status:   Future    Number of Occurrences:   1    Standing Expiration Date:   09/03/2023   BCR ABL1 FISH ([facility])    Standing Status:   Future    Number of Occurrences:   1    Standing Expiration Date:   09/04/2023    All questions were answered. The patient knows to call the clinic with any problems, questions or concerns.  I have spent a total of 60 minutes minutes of face-to-face and non-face-to-face time, preparing to see the patient, obtaining and/or reviewing separately obtained history, performing a medically appropriate examination, counseling and educating the patient, ordering tests/procedures, documenting clinical information in the electronic health record, and care coordination.   Dede Query, PA-C Department of Hematology/Oncology [place] at [facility] Phone: [phone]  Patient was seen with Dr. Lorenso Courier  I have read the above note and personally examined the patient. I agree with the assessment and plan as noted above.  Briefly Mrs. Kroeker is a 67 year old female with medical history significant for OSA who presents for evaluation of polycythemia.  The patient reports that she has been on CPAP machine for approximately 6 months time with 2 L of oxygen at night.  She  reports that she is a non-smoker and does not use any testosterone containing products.  At this time findings are most consistent with polycythemia secondary to OSA which requires titration.  In order to rule out myeloproliferative neoplasm we will plan to test for JAK2 with reflex as well as BCR/ABL FISH.  In the event these are negative would recommend continued evaluation with sleep medicine for further evaluation.   The patient voiced understanding of our findings and the plan moving forward.   Ledell Peoples, MD Department of Hematology/Oncology [place] at [facility] Phone: [phone] Pager: [phone] Email: [email or web address]

## 2022-09-04 ENCOUNTER — Inpatient Hospital Stay: Payer: Medicare Other

## 2022-09-04 ENCOUNTER — Inpatient Hospital Stay: Payer: Medicare Other | Attending: Physician Assistant | Admitting: Physician Assistant

## 2022-09-04 VITALS — BP 107/77 | HR 99 | Temp 97.7°F | Resp 16 | Wt 248.7 lb

## 2022-09-04 DIAGNOSIS — Z87891 Personal history of nicotine dependence: Secondary | ICD-10-CM

## 2022-09-04 DIAGNOSIS — E119 Type 2 diabetes mellitus without complications: Secondary | ICD-10-CM | POA: Diagnosis not present

## 2022-09-04 DIAGNOSIS — D751 Secondary polycythemia: Secondary | ICD-10-CM

## 2022-09-04 DIAGNOSIS — I10 Essential (primary) hypertension: Secondary | ICD-10-CM | POA: Diagnosis not present

## 2022-09-04 DIAGNOSIS — D45 Polycythemia vera: Secondary | ICD-10-CM

## 2022-09-04 LAB — CMP (CANCER CENTER ONLY)
ALT: 11 U/L (ref 0–44)
AST: 12 U/L — ABNORMAL LOW (ref 15–41)
Albumin: 4.1 g/dL (ref 3.5–5.0)
Alkaline Phosphatase: 69 U/L (ref 38–126)
Anion gap: 7 (ref 5–15)
BUN: 26 mg/dL — ABNORMAL HIGH (ref 8–23)
CO2: 28 mmol/L (ref 22–32)
Calcium: 10.1 mg/dL (ref 8.9–10.3)
Chloride: 105 mmol/L (ref 98–111)
Creatinine: 0.87 mg/dL (ref 0.44–1.00)
GFR, Estimated: >60 mL/min (ref >60)
Glucose, Bld: 96 mg/dL (ref 70–99)
Potassium: 4.3 mmol/L (ref 3.5–5.1)
Sodium: 140 mmol/L (ref 135–145)
Total Bilirubin: 0.6 mg/dL (ref 0.3–1.2)
Total Protein: 7.4 g/dL (ref 6.5–8.1)

## 2022-09-04 LAB — CBC WITH DIFFERENTIAL (CANCER CENTER ONLY)
Abs Immature Granulocytes: 0.09 10*3/uL — ABNORMAL HIGH (ref 0.00–0.07)
Basophils Absolute: 0 10*3/uL (ref 0.0–0.1)
Basophils Relative: 1 %
Eosinophils Absolute: 0.1 10*3/uL (ref 0.0–0.5)
Eosinophils Relative: 1 %
HCT: 48.6 % — ABNORMAL HIGH (ref 36.0–46.0)
Hemoglobin: 16.8 g/dL — ABNORMAL HIGH (ref 12.0–15.0)
Immature Granulocytes: 1 %
Lymphocytes Relative: 26 %
Lymphs Abs: 1.8 10*3/uL (ref 0.7–4.0)
MCH: 31.4 pg (ref 26.0–34.0)
MCHC: 34.6 g/dL (ref 30.0–36.0)
MCV: 90.8 fL (ref 80.0–100.0)
Monocytes Absolute: 0.5 10*3/uL (ref 0.1–1.0)
Monocytes Relative: 7 %
Neutro Abs: 4.5 10*3/uL (ref 1.7–7.7)
Neutrophils Relative %: 64 %
Platelet Count: 236 10*3/uL (ref 150–400)
RBC: 5.35 MIL/uL — ABNORMAL HIGH (ref 3.87–5.11)
RDW: 13.2 % (ref 11.5–15.5)
WBC Count: 7 10*3/uL (ref 4.0–10.5)
nRBC: 0 % (ref 0.0–0.2)

## 2022-09-05 ENCOUNTER — Other Ambulatory Visit: Payer: Self-pay | Admitting: Pulmonary Disease

## 2022-09-06 ENCOUNTER — Telehealth: Payer: Self-pay | Admitting: Physician Assistant

## 2022-09-06 ENCOUNTER — Ambulatory Visit
Admission: RE | Admit: 2022-09-06 | Discharge: 2022-09-06 | Disposition: A | Discharge status: Home / Self Care | Payer: Medicare Other | Source: Ambulatory Visit | Attending: Physical Medicine and Rehabilitation | Admitting: Physical Medicine and Rehabilitation

## 2022-09-06 DIAGNOSIS — M5416 Radiculopathy, lumbar region: Secondary | ICD-10-CM

## 2022-09-06 DIAGNOSIS — M47816 Spondylosis without myelopathy or radiculopathy, lumbar region: Secondary | ICD-10-CM

## 2022-09-06 DIAGNOSIS — M4726 Other spondylosis with radiculopathy, lumbar region: Secondary | ICD-10-CM

## 2022-09-06 LAB — ERYTHROPOIETIN: Erythropoietin: 8.9 m[IU]/mL (ref 2.6–18.5)

## 2022-09-06 NOTE — Telephone Encounter (Signed)
Called patient per 1/31 los notes to schedule f/u. Patient scheduled and notified.

## 2022-09-10 ENCOUNTER — Telehealth: Payer: Self-pay | Admitting: Physical Medicine and Rehabilitation

## 2022-09-10 NOTE — Telephone Encounter (Signed)
Spoke with patient and scheduled ov for 09/11/22

## 2022-09-10 NOTE — Telephone Encounter (Signed)
Patient called. Would like an appointment with [facility]. Had MRI

## 2022-09-11 ENCOUNTER — Encounter: Payer: Self-pay | Admitting: Physical Medicine and Rehabilitation

## 2022-09-11 ENCOUNTER — Ambulatory Visit (INDEPENDENT_AMBULATORY_CARE_PROVIDER_SITE_OTHER): Payer: Medicare Other | Admitting: Physical Medicine and Rehabilitation

## 2022-09-11 DIAGNOSIS — M47816 Spondylosis without myelopathy or radiculopathy, lumbar region: Secondary | ICD-10-CM | POA: Diagnosis not present

## 2022-09-11 DIAGNOSIS — M545 Low back pain, unspecified: Secondary | ICD-10-CM

## 2022-09-11 DIAGNOSIS — G8929 Other chronic pain: Secondary | ICD-10-CM

## 2022-09-11 LAB — JAK2 (INCLUDING V617F AND EXON 12), MPL,& CALR W/RFL MPN PANEL (NGS)

## 2022-09-11 LAB — BCR ABL1 FISH (GENPATH)
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
## 2022-09-11 NOTE — Progress Notes (Signed)
Functional Pain Scale - descriptive words and definitions  No Pain (0)   No Pain/Loss of function  Average Pain  varies   Lower back pain on left side that comes and goes. MRI review

## 2022-09-12 NOTE — Telephone Encounter (Signed)
I called Leah Lawson to review the lab results from 09/04/2022. Findings show stable polycythemia with Hgb 16.8. 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 Recommend to follow up with pulmonologist to confirm if CPAP machine needs titration. We will see patient back in 6 months to monitor levels. She expressed understanding of the plan provided.

## 2022-09-17 ENCOUNTER — Encounter: Payer: Self-pay | Admitting: Gastroenterology

## 2022-09-18 ENCOUNTER — Ambulatory Visit (INDEPENDENT_AMBULATORY_CARE_PROVIDER_SITE_OTHER): Payer: Medicare Other | Admitting: Pulmonary Disease

## 2022-09-18 ENCOUNTER — Encounter (HOSPITAL_BASED_OUTPATIENT_CLINIC_OR_DEPARTMENT_OTHER): Payer: Self-pay | Admitting: Pulmonary Disease

## 2022-09-18 VITALS — BP 112/68 | HR 83 | Ht 61.5 in | Wt 247.0 lb

## 2022-09-18 DIAGNOSIS — G4733 Obstructive sleep apnea (adult) (pediatric): Secondary | ICD-10-CM

## 2022-09-18 DIAGNOSIS — J432 Centrilobular emphysema: Secondary | ICD-10-CM

## 2022-09-18 DIAGNOSIS — G4734 Idiopathic sleep related nonobstructive alveolar hypoventilation: Secondary | ICD-10-CM

## 2022-09-18 NOTE — Patient Instructions (Signed)
x referral to lung cancer screening program  Xcheck ONo on CPAP/RA  -do not use oxygen on the night of the test

## 2022-09-18 NOTE — Assessment & Plan Note (Signed)
Continue oxygen blended into CPAP for now. Reassess with nocturnal oximetry on CPAP/room air

## 2022-09-18 NOTE — Assessment & Plan Note (Signed)
Continue Anoro. Use albuterol for rescue. We discussed COPD action plan and signs and symptoms of COPD exacerbation

## 2022-09-18 NOTE — Assessment & Plan Note (Signed)
CPAP download was reviewed which shows excellent control of events on auto CPAP settings 5 to 15 cm with average pressure of 13 and maximum pressure of 14 cm.  She is much better compliant more than 5 hours per night and CPAP is certainly helped improve her daytime somnolence and fatigue  Weight loss encouraged, compliance with goal of at least 4-6 hrs every night is the expectation. Advised against medications with sedative side effects Cautioned against driving when sleepy - understanding that sleepiness will vary on a day to day basis

## 2022-09-18 NOTE — Progress Notes (Signed)
   Subjective:    Patient ID: Leah Lawson, female    DOB: January 25, 1956, 67 y.o.   MRN: [id number]  HPI  67 yo ex-smoker for follow-up of COPD & OSA with nocturnal hypoxia -on CPAP + 4 L O2 She smoked about a pack per day for 40 years until she quit in 2019.  She has a Oceanographer in Designer, fashion/clothing and taught business/finance at [employer]    [place] infection 03/2021 polycythemia , seen by hematology  36-monthfollow-up visit She had a rash over her face with CPAP mask and dryness of mouth with bloating. Since her last visit we referred her for mask fitting session, she was fitted with a large nasal oral mask [employer] and she states that this is working better.  Reviewed hematology visit for polycythemia She was initially hesitant due to cost issues but has now started on nocturnal oxygen.  This is blended into her CPAP tubing. She is able to use CPAP for 5 to 6 hours every night.  Rating pressure okay she still continues to have pressure marks on her face She does complain of dyspnea on exertion and wonders if this is related to her weight.  She has been unable to lose weight, she is compliant with Anoro   Significant tests/ events reviewed  CPAP titration >> 7 cm + 4 L O2 HST 12/2021 AHI 62/h, lowest desat 58% PFTs 12/2020 moderate airway obstruction, ratio 57, FEV1 68%, FVC 92%, TLC 109%, DLCO 9.7/53%   CT chest WO con 06/2020 emphysema , thyroid nodule   Review of Systems neg for any significant sore throat, dysphagia, itching, sneezing, nasal congestion or excess/ purulent secretions, fever, chills, sweats, unintended wt loss, pleuritic or exertional cp, hempoptysis, orthopnea pnd or change in chronic leg swelling. Also denies presyncope, palpitations, heartburn, abdominal pain, nausea, vomiting, diarrhea or change in bowel or urinary habits, dysuria,hematuria, rash, arthralgias, visual complaints, headache, numbness weakness or ataxia.     Objective:   Physical  Exam  Gen. Pleasant, obese, in no distress ENT - no lesions, no post nasal drip Neck: No JVD, no thyromegaly, no carotid bruits Lungs: no use of accessory muscles, no dullness to percussion, decreased without rales or rhonchi  Cardiovascular: Rhythm regular, heart sounds  normal, no murmurs or gallops, no peripheral edema Musculoskeletal: No deformities, no cyanosis or clubbing , no tremors       Assessment & Plan:

## 2022-09-18 NOTE — Addendum Note (Signed)
Addended by: Darliss Ridgel on: 09/18/2022 02:31 PM   Modules accepted: Orders

## 2022-09-28 ENCOUNTER — Other Ambulatory Visit: Payer: Self-pay | Admitting: Family Medicine

## 2022-09-28 DIAGNOSIS — E119 Type 2 diabetes mellitus without complications: Secondary | ICD-10-CM

## 2022-10-03 ENCOUNTER — Telehealth: Payer: Self-pay | Admitting: Family Medicine

## 2022-10-03 ENCOUNTER — Telehealth: Payer: Self-pay | Admitting: Cardiovascular Disease

## 2022-10-03 ENCOUNTER — Telehealth (HOSPITAL_BASED_OUTPATIENT_CLINIC_OR_DEPARTMENT_OTHER): Payer: Self-pay | Admitting: Cardiovascular Disease

## 2022-10-03 MED ORDER — LOSARTAN POTASSIUM 100 MG PO TABS: 100.0000 mg | ORAL_TABLET | Freq: Every day | ORAL | 1 refills | Status: DC

## 2022-10-03 MED ORDER — LOSARTAN POTASSIUM-HCTZ 100-25 MG PO TABS: 1.0000 | ORAL_TABLET | Freq: Every day | ORAL | 0 refills | Status: DC

## 2022-10-03 MED ORDER — HYDROCHLOROTHIAZIDE 25 MG PO TABS: 25.0000 mg | ORAL_TABLET | Freq: Every day | ORAL | 0 refills | Status: DC

## 2022-10-03 NOTE — Telephone Encounter (Signed)
New Message:     Patient said the pharmacist said they did have the Losartan- Hydrochlorothiazide in stock. She was told they could refill it separately. They had Losartan and Hydrochlorothiazide/.They said it is on back order. They do not know when it will be in stock.   *STAT* If patient is at the pharmacy, call can be transferred to refill team.   1. Which medications need to be refilled? (please list name of each medication and dose if known) Losartan and Hydrochlorothiazide  2. Which pharmacy/location (including street and city if local pharmacy) is medication to be sent to?[place], [place]  3. Do they need a 30 day or 90 day supply? 90 days and refills- Please call in today- only have 1 left

## 2022-10-03 NOTE — Telephone Encounter (Signed)
Rx request sent to pharmacy.  

## 2022-10-03 NOTE — Telephone Encounter (Signed)
Spoke with patient and advised reason sending the Losartan 100 mg and HCTZ 25 mg both daily  Rx sent to [facility]

## 2022-10-03 NOTE — Telephone Encounter (Signed)
LM for pt to return my call

## 2022-10-03 NOTE — Telephone Encounter (Signed)
Patient called upset about an issue with medication and wanted to speak with the provider. When nurse was unavailable, she hung up before call could be completed. Please reach out at [phone].

## 2022-10-03 NOTE — Telephone Encounter (Signed)
Patient called back to say that the prescription is not right. He needs to split into two different prescription. Please advise

## 2022-10-03 NOTE — Telephone Encounter (Signed)
Spoke with patient and advised reason sending the Losartan 100 mg and HCTZ 25 mg both daily  Rx sent to [facility]

## 2022-10-03 NOTE — Telephone Encounter (Signed)
Pt c/o medication issue:  1. Name of Medication:    100 MG LOSARTAN 25 MG HYDROCHLOROTHIAZIDE   2. How are you currently taking this medication (dosage and times per day)?   3. Are you having a reaction (difficulty breathing--STAT)?   4. What is your medication issue?  Pharmacy is calling back stating they received rx refill but they are requesting two separate RX refills since the losartan-hydrochlorothiazide [facility]) is on back order.  They want an rx sent for '100mg'$  losartan and a separate rx for 25 mg hydrochlorothiazide.   Please has one left. Please advise.

## 2022-10-05 ENCOUNTER — Other Ambulatory Visit: Payer: Self-pay | Admitting: Pulmonary Disease

## 2022-10-08 ENCOUNTER — Telehealth: Payer: Self-pay | Admitting: Pulmonary Disease

## 2022-10-08 ENCOUNTER — Telehealth (HOSPITAL_BASED_OUTPATIENT_CLINIC_OR_DEPARTMENT_OTHER): Payer: Self-pay | Admitting: Pulmonary Disease

## 2022-10-08 NOTE — Telephone Encounter (Signed)
Pt states has not heard anything since HST last week and has also not heard from anyone about the lung cancer screening program. Please advise and call pt with an update.

## 2022-10-08 NOTE — Telephone Encounter (Signed)
Pt is wanting to speak w Dr. Elsworth Soho personally before 1:30pm

## 2022-10-09 ENCOUNTER — Encounter: Payer: Self-pay | Admitting: Pulmonary Disease

## 2022-10-09 NOTE — Telephone Encounter (Signed)
[facility] just emailed this pt's ONO results to me as of 3/5 at 7:20pm   I have scanned them in to the pt's chart

## 2022-10-09 NOTE — Telephone Encounter (Signed)
Called patient wants to know results of ono states this was resolved yesterday

## 2022-10-09 NOTE — Telephone Encounter (Signed)
ATC patient and phone went to vm.  Left detailed vm (ok per dpr) with patients ONO results.  Also notified patient on vm that the nurse for the [facility] program is working on her referral and will give her a call soon.  Advised patient to call back for questions or more information.  Nothing further needed at this time.

## 2022-10-10 ENCOUNTER — Ambulatory Visit (INDEPENDENT_AMBULATORY_CARE_PROVIDER_SITE_OTHER): Payer: Medicare Other | Admitting: Family Medicine

## 2022-10-10 ENCOUNTER — Other Ambulatory Visit: Payer: Self-pay

## 2022-10-10 ENCOUNTER — Encounter (INDEPENDENT_AMBULATORY_CARE_PROVIDER_SITE_OTHER): Payer: Self-pay | Admitting: Family Medicine

## 2022-10-10 VITALS — BP 103/73 | HR 83 | Temp 98.5°F | Ht 62.0 in | Wt 242.0 lb

## 2022-10-10 DIAGNOSIS — Z87891 Personal history of nicotine dependence: Secondary | ICD-10-CM

## 2022-10-10 DIAGNOSIS — M519 Unspecified thoracic, thoracolumbar and lumbosacral intervertebral disc disorder: Secondary | ICD-10-CM

## 2022-10-10 DIAGNOSIS — Z122 Encounter for screening for malignant neoplasm of respiratory organs: Secondary | ICD-10-CM

## 2022-10-10 DIAGNOSIS — G4733 Obstructive sleep apnea (adult) (pediatric): Secondary | ICD-10-CM

## 2022-10-10 DIAGNOSIS — Z6841 Body Mass Index (BMI) 40.0 and over, adult: Secondary | ICD-10-CM

## 2022-10-10 DIAGNOSIS — E1169 Type 2 diabetes mellitus with other specified complication: Secondary | ICD-10-CM | POA: Diagnosis not present

## 2022-10-10 DIAGNOSIS — Z0289 Encounter for other administrative examinations: Secondary | ICD-10-CM

## 2022-10-10 DIAGNOSIS — Z7984 Long term (current) use of oral hypoglycemic drugs: Secondary | ICD-10-CM

## 2022-10-10 NOTE — Progress Notes (Signed)
Office: [phone]  /  Fax: [phone]   Initial Visit  Leah Lawson was seen in clinic today to evaluate for obesity. She is interested in losing weight to improve overall health and reduce the risk of weight related complications. She presents today to review program treatment options, initial physical assessment, and evaluation.     She was referred by: PCP  When asked what else they would like to accomplish? She states: Improve existing medical conditions, Improve quality of life, and Other: would like to come off CPAP   would like to weight< 200 lb  Weight history:  + childhood obesity; weight slowly increasing with long periods of stable weight  When asked how has your weight affected you? She states: Contributed to medical problems, Contributed to orthopedic problems or mobility issues, and Having fatigue  Some associated conditions: Hypertension, Arthritis:back, knees, OSA, Diabetes, and Lung disease  Contributing factors: Family history, Stress, and Other: retired from Data processing manager work; gained [employer] when she quit smoking 5 year  Weight promoting medications identified: None  Current nutrition plan: None  Current level of physical activity: Limited due to chronic pain or orthopedic problems Likes to walk but back pain, dyspnea and knee pain limits her  Current or previous pharmacotherapy: None  Response to medication: Never tried medications   Past medical history includes:   Past Medical History:  Diagnosis Date   Asthma    COPD (chronic obstructive pulmonary disease) ([place])    Diabetes mellitus without complication ([place])    "pre-diabetic"   Hypertension    Hypertensive retinopathy 10/26/2021   Open-angle glaucoma 10/26/2021     Objective:   BP 103/73   Pulse 83   Temp 98.5 F (36.9 C)   Ht '5\' 2"'$  (1.575 m)   Wt 242 lb (109.8 kg)   SpO2 96%   BMI 44.26 kg/m  She was weighed on the bioimpedance scale: Body mass index is 44.26 kg/m.  Peak  [id number] , Body Fat%:49.5, Visceral Fat Rating:18, Weight trend over the last 12 months: Increasing  General:  Alert, oriented and cooperative. Patient is in no acute distress.  Respiratory: Normal respiratory effort, no problems with respiration noted   Gait: able to ambulate independently  Mental Status: Normal mood and affect. Normal behavior. Normal judgment and thought content.   DIAGNOSTIC DATA REVIEWED:  BMET    Component Value Date/Time   NA 140 09/04/2022 1332   NA 140 02/21/2022 1112   K 4.3 09/04/2022 1332   CL 105 09/04/2022 1332   CO2 28 09/04/2022 1332   GLUCOSE 96 09/04/2022 1332   BUN 26 (H) 09/04/2022 1332   BUN 23 02/21/2022 1112   CREATININE 0.87 09/04/2022 1332   CALCIUM 10.1 09/04/2022 1332   GFRNONAA >60 09/04/2022 1332   GFRAA >60 11/30/2019 0658   Lab Results  Component Value Date   HGBA1C 7.7 (H) 08/14/2022   HGBA1C 7.2 (H) 11/26/2019   No results found for: "INSULIN" CBC    Component Value Date/Time   WBC 7.0 09/04/2022 1332   WBC 5.8 08/14/2022 1051   RBC 5.35 (H) 09/04/2022 1332   HGB 16.8 (H) 09/04/2022 1332   HCT 48.6 (H) 09/04/2022 1332   PLT 236 09/04/2022 1332   MCV 90.8 09/04/2022 1332   MCH 31.4 09/04/2022 1332   MCHC 34.6 09/04/2022 1332   RDW 13.2 09/04/2022 1332   Iron/TIBC/Ferritin/ %Sat    Component Value Date/Time   IRON 62 01/31/2022 1114   TIBC 350 01/31/2022 1114  FERRITIN 25 01/31/2022 1114   IRONPCTSAT 18 01/31/2022 1114   Lipid Panel     Component Value Date/Time   CHOL 168 08/14/2022 1051   TRIG 115.0 08/14/2022 1051   HDL 37.10 (L) 08/14/2022 1051   CHOLHDL 5 08/14/2022 1051   VLDL 23.0 08/14/2022 1051   LDLCALC 108 (H) 08/14/2022 1051   Hepatic Function Panel     Component Value Date/Time   PROT 7.4 09/04/2022 1332   ALBUMIN 4.1 09/04/2022 1332   AST 12 (L) 09/04/2022 1332   ALT 11 09/04/2022 1332   ALKPHOS 69 09/04/2022 1332   BILITOT 0.6 09/04/2022 1332      Component Value Date/Time    TSH 1.07 08/14/2022 1051     Assessment and Plan:   Diabetes mellitus type 2 in obese [facility]) Assessment & Plan: Reports side effects from metformin in the past Doing well on Faxiga 5 mg daily for diabetes Denies low blood sugars Not checking sugars at home Reports high intake of starches and added sugar Currently doing no regular exercise  Lab Results  Component Value Date   HGBA1C 7.7 (H) 08/14/2022     Obtain labs next visit.  Begin active plan for weight reduction and consider addition of a GLP-1 receptor agonist.    Morbid obesity ([place])  BMI 40.0-44.9, adult ([place])  OSA on CPAP Assessment & Plan: Patient is wearing CPAP nightly for obstructive sleep apnea aiming for 7 to 8 hours of sleep.  She is unhappy with her CPAP and hopes to lose enough weight to come off CPAP.  She is also requiring nocturnal oxygen.  Begin active plan for weight loss.  Aim for 8 hours of sleep at night with CPAP   Lumbar disc disease Assessment & Plan: Low back pain has worsened with weight gain.  Back pain has also limited her ability to do much walking.  She does have a [employer] and we discussed adding in water exercise 2-3 times a week.  Check out water exercise classes within the next week         Obesity Treatment / Action Plan:  Patient will work on garnering support from family and friends to begin weight loss journey. Will work on eliminating or reducing the presence of highly palatable, calorie dense foods in the home. Will complete provided nutritional and psychosocial assessment questionnaire before the next appointment. Will be scheduled for indirect calorimetry to determine resting energy expenditure in a fasting state.  This will allow [place] to create a reduced calorie, high-protein meal plan to promote loss of fat mass while preserving muscle mass. Will think about ideas on how to incorporate physical activity into their daily routine. Will work on reducing intake of  added sugars, simple sugars and processed carbs. Will reduce liquid calories and sugary drinks from diet. Was counseled on nutritional approaches to weight loss and benefits of complex carbs and high quality protein as part of nutritional weight management. Was counseled on pharmacotherapy and role as an adjunct in weight management.   Obesity Education Performed Today:  She was weighed on the bioimpedance scale and results were discussed and documented in the synopsis.  We discussed obesity as a disease and the importance of a more detailed evaluation of all the factors contributing to the disease.  We discussed the importance of long term lifestyle changes which include nutrition, exercise and behavioral modifications as well as the importance of customizing this to her specific health and social needs.  We discussed the  benefits of reaching a healthier weight to alleviate the symptoms of existing conditions and reduce the risks of the biomechanical, metabolic and psychological effects of obesity.  Leah Lawson appears to be in the action stage of change and states they are ready to start intensive lifestyle modifications and behavioral modifications.  30 minutes was spent today on this visit including the above counseling, pre-visit chart review, and post-visit documentation.  Reviewed by clinician on day of visit: allergies, medications, problem list, medical history, surgical history, family history, social history, and previous encounter notes pertinent to obesity diagnosis.    Loyal Gambler, D.O. DABFM, DABOM [facility] [phone] [place], [place] [address] [phone]

## 2022-10-10 NOTE — Assessment & Plan Note (Signed)
Low back pain has worsened with weight gain.  Back pain has also limited her ability to do much walking.  She does have a [employer] and we discussed adding in water exercise 2-3 times a week.  Check out water exercise classes within the next week

## 2022-10-10 NOTE — Assessment & Plan Note (Addendum)
Reports side effects from metformin in the past Doing well on Faxiga 5 mg daily for diabetes Denies low blood sugars Not checking sugars at home Reports high intake of starches and added sugar Currently doing no regular exercise  Lab Results  Component Value Date   HGBA1C 7.7 (H) 08/14/2022     Obtain labs next visit.  Begin active plan for weight reduction and consider addition of a GLP-1 receptor agonist.

## 2022-10-10 NOTE — Assessment & Plan Note (Signed)
Patient is wearing CPAP nightly for obstructive sleep apnea aiming for 7 to 8 hours of sleep.  She is unhappy with her CPAP and hopes to lose enough weight to come off CPAP.  She is also requiring nocturnal oxygen.  Begin active plan for weight loss.  Aim for 8 hours of sleep at night with CPAP

## 2022-10-16 ENCOUNTER — Ambulatory Visit (INDEPENDENT_AMBULATORY_CARE_PROVIDER_SITE_OTHER): Payer: Medicare Other | Admitting: Physician Assistant

## 2022-10-16 ENCOUNTER — Encounter: Payer: Self-pay | Admitting: Physician Assistant

## 2022-10-16 DIAGNOSIS — Z87891 Personal history of nicotine dependence: Secondary | ICD-10-CM | POA: Diagnosis not present

## 2022-10-16 NOTE — Patient Instructions (Signed)
Thank you for participating in the [facility]. It was our pleasure to meet you today. We will call you with the results of your scan within the next few days. Your scan will be assigned a Lung RADS category score by the physicians reading the scans.  This Lung RADS score determines follow up scanning.  See below for description of categories, and follow up screening recommendations. We will be in touch to schedule your follow up screening annually or based on recommendations of our providers. We will fax a copy of your scan results to your Primary Care Physician, or the physician who referred you to the program, to ensure they have the results. Please call the office if you have any questions or concerns regarding your scanning experience or results.  Our office number is [phone]. Please speak with Denise Phelps, RN. , or  Denise Buckner RN, They are  our Lung Cancer Screening RN.'s If They are unavailable when you call, Please leave a message on the voice mail. We will return your call at our earliest convenience.This voice mail is monitored several times a day.  Remember, if your scan is normal, we will scan you annually as long as you continue to meet the criteria for the program. (Age 50-80, Current smoker or smoker who has quit within the last 15 years). If you are a smoker, remember, quitting is the single most powerful action that you can take to decrease your risk of lung cancer and other pulmonary, breathing related problems. We know quitting is hard, and we are here to help.  Please let us know if there is anything we can do to help you meet your goal of quitting. If you are a former smoker, congratulations. We are proud of you! Remain smoke free! Remember you can refer friends or family members through the number above.  We will screen them to make sure they meet criteria for the program. Thank you for helping us take better care of you by  participating in Lung Screening.  You can receive free nicotine replacement therapy ( patches, gum or mints) by calling 1-800-QUIT NOW. Please call so we can get you on the path to becoming  a non-smoker. I know it is hard, but you can do this!  Lung RADS Categories:  Lung RADS 1: no nodules or definitely non-concerning nodules.  Recommendation is for a repeat annual scan in 12 months.  Lung RADS 2:  nodules that are non-concerning in appearance and behavior with a very low likelihood of becoming an active cancer. Recommendation is for a repeat annual scan in 12 months.  Lung RADS 3: nodules that are probably non-concerning , includes nodules with a low likelihood of becoming an active cancer.  Recommendation is for a 6-month repeat screening scan. Often noted after an upper respiratory illness. We will be in touch to make sure you have no questions, and to schedule your 6-month scan.  Lung RADS 4 A: nodules with concerning findings, recommendation is most often for a follow up scan in 3 months or additional testing based on our provider's assessment of the scan. We will be in touch to make sure you have no questions and to schedule the recommended 3 month follow up scan.  Lung RADS 4 B:  indicates findings that are concerning. We will be in touch with you to schedule additional diagnostic testing based on our provider's  assessment of the scan.  Other options for assistance in smoking cessation (   As covered by your insurance benefits)  Hypnosis for smoking cessation  [employer] [phone]  Acupuncture for smoking cessation  [facility] [phone]   

## 2022-10-16 NOTE — Progress Notes (Signed)
Virtual Visit via Telephone Note  I connected with Leah Lawson on 10/16/22 at 10:00 AM EDT by telephone and verified that I am speaking with the correct person using two identifiers.  Location: Patient: home Provider: working virtually from home   I discussed the limitations, risks, security and privacy concerns of performing an evaluation and management service by telephone and the availability of in person appointments. I also discussed with the patient that there may be a patient responsible charge related to this service. The patient expressed understanding and agreed to proceed.      Shared Decision Making Visit Lung Cancer Screening Program [phone])   Eligibility: Age 67 y.o. Pack Years Smoking History Calculation 35 (# packs/per year x # years smoked) Recent History of coughing up blood  no Unexplained weight loss? no ( >Than 15 pounds within the last 6 months ) Prior History Lung / other cancer no (Diagnosis within the last 5 years already requiring surveillance chest CT Scans). Smoking Status Former Smoker Former Smokers: Years since quit: 5 years  Quit Date: 2019  Visit Components: Discussion included one or more decision making aids. yes Discussion included risk/benefits of screening. yes Discussion included potential follow up diagnostic testing for abnormal scans. yes Discussion included meaning and risk of over diagnosis. yes Discussion included meaning and risk of False Positives. yes Discussion included meaning of total radiation exposure. yes  Counseling Included: Importance of adherence to annual lung cancer LDCT screening. yes Impact of comorbidities on ability to participate in the program. yes Ability and willingness to under diagnostic treatment. yes  Smoking Cessation Counseling: Former Smokers:  Discussed the importance of maintaining cigarette abstinence. yes Diagnosis Code: Personal History of Nicotine Dependence. [id number] Information about  tobacco cessation classes and interventions provided to patient. Yes Patient provided with "ticket" for LDCT Scan. N/a Written Order for Lung Cancer Screening with LDCT placed in Epic. Yes (CT Chest Lung Cancer Screening Low Dose W/O CM) [id number] Z12.2-Screening of respiratory organs Z87.891-Personal history of nicotine dependence     I spent 25 minutes of face to face time/virtual visit time  with patient discussing the risks and benefits of lung cancer screening. We took the time to pause the power point at intervals to allow for questions to be asked and answered to ensure understanding. We discussed that she had taken the single most powerful action possible to decrease her risk of developing lung cancer when she quit smoking. I counseled her to remain smoke free, and to contact me if she ever had the desire to smoke again so that I can provide resources and tools to help support the effort to remain smoke free. We discussed the time and location of the scan, and that either  Doroteo Glassman RN, Joella Prince, RN or I  or I will call / send a letter with the results within  24-72 hours of receiving them. she has the office contact information in the event she needs to speak with me,  she verbalized understanding of all of the above and had no further questions upon leaving the office.     I explained to the patient that there has been a high incidence of coronary artery disease noted on these exams. I explained that this is a non-gated exam therefore degree or severity cannot be determined. This patient is not on statin therapy. I have asked the patient to follow-up with their PCP regarding any incidental finding of coronary artery disease and management with diet or medication as they  feel is clinically indicated. The patient verbalized understanding of the above and had no further questions.   Otilio Carpen Joylyn Duggin, PA-C

## 2022-10-23 ENCOUNTER — Telehealth: Payer: Self-pay | Admitting: Pulmonary Disease

## 2022-10-23 NOTE — Telephone Encounter (Signed)
Called and spoke w/ pt to verify she received Meagan's VM, she verbalized understanding and NFN att.

## 2022-10-23 NOTE — Telephone Encounter (Signed)
Attempted to call pt but unable to reach. Left message to return call.  

## 2022-10-23 NOTE — Telephone Encounter (Signed)
ONO on CPAP/RA showed desat 1h 48 mins Ideally she needs both CPAP & o2

## 2022-10-24 ENCOUNTER — Encounter: Payer: Self-pay | Admitting: Gastroenterology

## 2022-10-24 ENCOUNTER — Ambulatory Visit (INDEPENDENT_AMBULATORY_CARE_PROVIDER_SITE_OTHER): Payer: Medicare Other | Admitting: Gastroenterology

## 2022-10-24 VITALS — BP 118/68 | HR 80 | Ht 62.0 in | Wt 246.0 lb

## 2022-10-24 DIAGNOSIS — Z800 Family history of malignant neoplasm of digestive organs: Secondary | ICD-10-CM

## 2022-10-24 DIAGNOSIS — K219 Gastro-esophageal reflux disease without esophagitis: Secondary | ICD-10-CM

## 2022-10-24 MED ORDER — NA SULFATE-K SULFATE-MG SULF 17.5-3.13-1.6 GM/177ML PO SOLN: 1.0000 | Freq: Once | ORAL | 0 refills | Status: AC

## 2022-10-24 MED ORDER — OMEPRAZOLE 20 MG PO CPDR: 20.0000 mg | DELAYED_RELEASE_CAPSULE | Freq: Every day | ORAL | 3 refills | Status: DC

## 2022-10-24 NOTE — Progress Notes (Signed)
HPI : Leah Lawson is a very pleasant 67 year old female with COPD, obesity and HTN who is referred to [place] by Harland Dingwall, NP-C for consideration of colon cancer screening.  She tells me her father was diagnosed with colon cancer in his 1s.  She has never had a colonoscopy.  She had been doing stool based colon cancer screening tests previously, but does not know when her last one was; believes it has been at least 3 years.  I do not seen any results in Epic. She sometimes has constipation when she doesn't eat enough greens, otherwise she denies any chronic lower GI symptoms.  No abdominal pain, diarrhea or blood in the stool.  She is bothered by chronic heartburn and acid regurgitation.  She frequently feels like food sits in her upper abdomen after meals.  She takes TUMs most everynight.  She has not tried taking PPIs or H2RAs.  No dysphagia.  She has chronic dyspnea secondary to COPD/chronic bronchitis which is stable.  She stopped smoking 4 years ago. She has no known cardiac comorbidities.  She denies exertional chest pressure or light-headedness/dizziness.  No orthopnea or LE swelling. She has severe sleep apnea with supplemental oxygen.  She does not require oxygen except while asleep.  Past Medical History:  Diagnosis Date   Asthma    COPD (chronic obstructive pulmonary disease) ([place])    Diabetes mellitus without complication ([place])    "pre-diabetic"   Hypertension    Hypertensive retinopathy 10/26/2021   Open-angle glaucoma 10/26/2021     Past Surgical History:  Procedure Laterality Date   BREAST SURGERY     Family History  Problem Relation Age of Onset   Heart failure Mother    Arthritis Mother    Diabetes Mother    High blood pressure Mother    Cancer Father        prostate, lung   COPD Father    High Cholesterol Father    High blood pressure Father    Dementia Father    Diabetes Brother    High blood pressure Brother    Stroke Brother    Cancer Maternal  Grandfather    Miscarriages / Stillbirths Daughter    Breast cancer Cousin    Social History   Tobacco Use   Smoking status: Former    Packs/day: 0.75    Years: 46.00    Additional pack years: 0.00    Total pack years: 34.50    Types: Cigarettes    Quit date: 05/05/2018    Years since quitting: 4.4    Passive exposure: Past   Smokeless tobacco: Never  Vaping Use   Vaping Use: Never used  Substance Use Topics   Alcohol use: Not Currently   Drug use: Never   Current Outpatient Medications  Medication Sig Dispense Refill   albuterol (VENTOLIN HFA) 108 (90 Base) MCG/ACT inhaler INHALE 2 PUFFS BY MOUTH EVERY 6 HOURS AS NEEDED FOR WHEEZING FOR SHORTNESS OF BREATH 9 g 0   COD LIVER OIL PO Take by mouth.     dapagliflozin propanediol (FARXIGA) 5 MG TABS tablet TAKE 1 TABLET BY MOUTH ONCE DAILY BEFORE BREAKFAST 30 tablet 2   hydrochlorothiazide (HYDRODIURIL) 25 MG tablet Take 1 tablet (25 mg total) by mouth daily. 90 tablet 0   losartan (COZAAR) 100 MG tablet Take 1 tablet (100 mg total) by mouth daily. 90 tablet 1   spironolactone (ALDACTONE) 25 MG tablet Take 1 tablet (25 mg total) by mouth daily.  90 tablet 3   triamcinolone cream (KENALOG) 0.1 % Apply 1 Application topically 2 (two) times daily. 30 g 0   umeclidinium-vilanterol (ANORO ELLIPTA) 62.5-25 MCG/ACT AEPB Inhale 1 puff into the lungs daily. 60 each 5   Vitamin D, Ergocalciferol, (DRISDOL) 1.25 MG (50000 UNIT) CAPS capsule Take 1 capsule (50,000 Units total) by mouth every 7 (seven) days. 12 capsule 0   No current facility-administered medications for this visit.   Allergies  Allergen Reactions   Hydralazine Swelling    In feet and ankle   Lisinopril Cough   Singulair [Montelukast Sodium] Other (See Comments)    headaches   Mucinex [Guaifenesin Er] Diarrhea and Rash     Review of Systems: All systems reviewed and negative except where noted in HPI.    No results found.  Physical Exam: BP 118/68   Pulse 80    Ht 5\' 2"  (1.575 m)   Wt 246 lb (111.6 kg)   SpO2 90%   BMI 44.99 kg/m  Constitutional: Pleasant,well-developed, African American female in no acute distress. HEENT: Normocephalic and atraumatic. Conjunctivae are normal. No scleral icterus. Neck supple.  Cardiovascular: Normal rate, regular rhythm.  Pulmonary/chest: Effort normal and breath sounds normal. Faint expiratory wheezes bilaterally anteriorly.  No rales or rhonchi. Abdominal: Soft, nondistended, nontender. Bowel sounds active throughout. There are no masses palpable. No hepatomegaly. Extremities: no edema Neurological: Alert and oriented to person place and time. Skin: Skin is warm and dry. No rashes noted. Psychiatric: Normal mood and affect. Behavior is normal.  CBC    Component Value Date/Time   WBC 7.0 09/04/2022 1332   WBC 5.8 08/14/2022 1051   RBC 5.35 (H) 09/04/2022 1332   HGB 16.8 (H) 09/04/2022 1332   HCT 48.6 (H) 09/04/2022 1332   PLT 236 09/04/2022 1332   MCV 90.8 09/04/2022 1332   MCH 31.4 09/04/2022 1332   MCHC 34.6 09/04/2022 1332   RDW 13.2 09/04/2022 1332   LYMPHSABS 1.8 09/04/2022 1332   MONOABS 0.5 09/04/2022 1332   EOSABS 0.1 09/04/2022 1332   BASOSABS 0.0 09/04/2022 1332    CMP     Component Value Date/Time   NA 140 09/04/2022 1332   NA 140 02/21/2022 1112   K 4.3 09/04/2022 1332   CL 105 09/04/2022 1332   CO2 28 09/04/2022 1332   GLUCOSE 96 09/04/2022 1332   BUN 26 (H) 09/04/2022 1332   BUN 23 02/21/2022 1112   CREATININE 0.87 09/04/2022 1332   CALCIUM 10.1 09/04/2022 1332   PROT 7.4 09/04/2022 1332   ALBUMIN 4.1 09/04/2022 1332   AST 12 (L) 09/04/2022 1332   ALT 11 09/04/2022 1332   ALKPHOS 69 09/04/2022 1332   BILITOT 0.6 09/04/2022 1332   GFRNONAA >60 09/04/2022 1332   GFRAA >60 11/30/2019 0658     ASSESSMENT AND PLAN: 67 year old female with COPD, severe OSA and obesity with family history of colon cancer, due for screening colonoscopy.  She has had negative stool-based  screening tests, but given her family history of colon cancer, she should undergo a colonoscopy.  No concerning lower GI symptoms.  She has chronic stable dyspnea secondary to COPD and deconditioning but no concerning new symptoms. She has frequent typical GERD symptoms which she treats with antacids.   We discussed the pathophysiology of GERD and the principles of GERD management to include lifestyle modifications  such as dietary discretion (avoidance of alcohol, tobacco, caffeinated and carbonated beverages, spicy/greasy foods, citrus, peppermint/chocolate), weight loss if applicable, head  of bed elevation andconsuming last meal of day within 3 hours of bedtime; pharmacologic options to include PPIs, H2RAs and OTC antacids; and finally surgical or endoscopic fundoplication. Given her symptom frequency, I recommended she start taking a daily PPI.  She is trying to lose weight, and suggested that her GERD symptoms would likely improve with weight loss as well.  No indication for upper endoscopy at this time.  Family history of colon cancer - Colonoscopy  GERD - Start omeprazole 20 mg PO daily - GERD handout  The details, risks (including bleeding, perforation, infection, missed lesions, medication reactions and possible hospitalization or surgery if complications occur), benefits, and alternatives to colonoscopy with possible biopsy and possible polypectomy were discussed with the patient and she consents to proceed.   Netty Sullivant E. Candis Schatz, MD [facility]  Girtha Rm, NP-C

## 2022-10-24 NOTE — Patient Instructions (Addendum)
We have sent the following medications to your pharmacy for you to pick up at your convenience: Omeprazole   You have been scheduled for a colonoscopy. Please follow written instructions given to you at your visit today.  Please pick up your prep supplies at the pharmacy within the next 1-3 days. If you use inhalers (even only as needed), please bring them with you on the day of your procedure.  _______________________________________________________  If your blood pressure at your visit was 140/90 or greater, please contact your primary care physician to follow up on this.  _______________________________________________________  If you are age 39 or older, your body mass index should be between 23-30. Your Body mass index is 44.99 kg/m. If this is out of the aforementioned range listed, please consider follow up with your Primary Care Provider.  If you are age 36 or younger, your body mass index should be between 19-25. Your Body mass index is 44.99 kg/m. If this is out of the aformentioned range listed, please consider follow up with your Primary Care Provider.   ________________________________________________________  The [facility] providers would like to encourage you to use [facility] to communicate with providers for non-urgent requests or questions.  Due to long hold times on the telephone, sending your provider a message by [facility] may be a faster and more efficient way to get a response.  Please allow 48 business hours for a response.  Please remember that this is for non-urgent requests.  _______________________________________________________

## 2022-10-24 NOTE — Telephone Encounter (Signed)
Sent pt a Pharmacist, community message of the ONO results.

## 2022-10-25 ENCOUNTER — Ambulatory Visit
Admission: RE | Admit: 2022-10-25 | Discharge: 2022-10-25 | Disposition: A | Discharge status: Home / Self Care | Payer: Medicare Other | Source: Ambulatory Visit | Attending: Family Medicine | Admitting: Family Medicine

## 2022-10-25 DIAGNOSIS — Z87891 Personal history of nicotine dependence: Secondary | ICD-10-CM

## 2022-10-25 DIAGNOSIS — Z122 Encounter for screening for malignant neoplasm of respiratory organs: Secondary | ICD-10-CM

## 2022-10-28 ENCOUNTER — Telehealth: Payer: Self-pay | Admitting: Acute Care

## 2022-10-28 NOTE — Telephone Encounter (Signed)
Sarah please review the following LCS CT call report:   CLINICAL DATA:  Former smoker, quit 2019, 35 pack-year history.   EXAM: CT CHEST WITHOUT CONTRAST LOW-DOSE FOR LUNG CANCER SCREENING   TECHNIQUE: Multidetector CT imaging of the chest was performed following the standard protocol without IV contrast.   RADIATION DOSE REDUCTION: This exam was performed according to the departmental dose-optimization program which includes automated exposure control, adjustment of the mA and/or kV according to patient size and/or use of iterative reconstruction technique.   COMPARISON:  06/05/2020.   FINDINGS: Cardiovascular: Atherosclerotic calcification of the aorta. Enlarged pulmonic trunk. Heart size normal. No pericardial effusion.   Mediastinum/Nodes: No pathologically enlarged mediastinal or axillary lymph nodes. Hilar regions are difficult to definitively evaluate without IV contrast. Esophagus is grossly unremarkable.   Lungs/Pleura: Centrilobular emphysema. Scattered pulmonary parenchymal scarring, as on 06/05/2020. 5.2 mm anterior segment right upper lobe nodule (9/118), new from 06/05/2020. 4.5 mm posterior subpleural right lower lobe nodule, unchanged. No pleural fluid. Debris is seen in the airway.   Upper Abdomen: Visualized portions of the liver and adrenal glands are unremarkable. Right renal stone. Visualized portions of the kidneys, spleen, pancreas, stomach and bowel are otherwise unremarkable with the exception of a small hiatal hernia. No upper abdominal adenopathy.   Musculoskeletal: Degenerative changes in the spine. Probable bone islands in lower thoracic vertebral bodies, unchanged.   IMPRESSION: 1. 5.2 mm anterior segment right upper lobe nodule is new from 06/05/2020. Lung-RADS 3, probably benign findings. Short-term follow-up in 6 months is recommended with repeat low-dose chest CT without contrast (please use the following order, "CT CHEST LCS NODULE  FOLLOW-UP W/O CM"). These results will be called to the ordering clinician or representative by the Radiologist Assistant, and communication documented in the PACS or [employer]. 2. Right renal stone. 3.  Aortic atherosclerosis (ICD10-I70.0). 4. Enlarged pulmonic trunk, indicative of pulmonary arterial hypertension. 5.  Emphysema (ICD10-J43.9).     Electronically Signed   By: Lorin Picket M.D.   On: 10/28/2022 09:07  Thank you

## 2022-10-28 NOTE — Telephone Encounter (Signed)
Tiffany calling with call report. For CT scan.Tiffany phone number is [phone]. 

## 2022-10-29 ENCOUNTER — Telehealth: Payer: Self-pay | Admitting: Acute Care

## 2022-10-29 ENCOUNTER — Other Ambulatory Visit: Payer: Self-pay

## 2022-10-29 DIAGNOSIS — Z87891 Personal history of nicotine dependence: Secondary | ICD-10-CM

## 2022-10-29 DIAGNOSIS — R911 Solitary pulmonary nodule: Secondary | ICD-10-CM

## 2022-10-29 NOTE — Telephone Encounter (Signed)
Spoke to patient by phone, using two patient identifiers, to review results of LDCT.  Recommendation to repeat LDCT to look at new nodule again in 6 months.  This is likely a benign finding but recommend a sooner review instead of waiting 12 months.  Patient agrees.  Patient states she does have some increased congestion for several months but no symptoms of illness - no fever and secretions clear.  She has an appointment with Dr. Elsworth Soho in May.  Also noted was a kidney stone (pt was aware of this), atherosclerosis and emphysema.  Patient is not on statin medication, stating 'she just didn't want to take it' but her PCP has discussed with her previously.  Advised to discuss with PCP again, if she may be interested in taking a new medication.  Enlarged pulmonic trunk was also noted.  Patient advised to keep blood pressure under control.  Patient states she has had a difficult time over the past year keeping blood pressure under control but she thinks she is taking the 'right medicines now' and she reports her recent BP was 120's over 60's.  Last documented BP in Epic 118/68.  Patient acknowledged understanding.  Order placed for follow up LDCT and results/plan faxed to PCP.

## 2022-10-31 ENCOUNTER — Other Ambulatory Visit: Payer: Self-pay | Admitting: Pulmonary Disease

## 2022-11-01 NOTE — Telephone Encounter (Signed)
Patient was notified of results on 3/26. Will close encounter.

## 2022-11-14 ENCOUNTER — Ambulatory Visit (HOSPITAL_COMMUNITY)
Admission: EM | Admit: 2022-11-14 | Discharge: 2022-11-14 | Disposition: A | Discharge status: Home / Self Care | Payer: Medicare Other | Attending: Sports Medicine | Admitting: Sports Medicine

## 2022-11-14 ENCOUNTER — Encounter (HOSPITAL_COMMUNITY): Payer: Self-pay

## 2022-11-14 ENCOUNTER — Ambulatory Visit (INDEPENDENT_AMBULATORY_CARE_PROVIDER_SITE_OTHER): Payer: Medicare Other

## 2022-11-14 DIAGNOSIS — M25522 Pain in left elbow: Secondary | ICD-10-CM

## 2022-11-14 DIAGNOSIS — W19XXXA Unspecified fall, initial encounter: Secondary | ICD-10-CM | POA: Diagnosis not present

## 2022-11-14 DIAGNOSIS — M25532 Pain in left wrist: Secondary | ICD-10-CM | POA: Diagnosis not present

## 2022-11-14 DIAGNOSIS — S52542A Smith's fracture of left radius, initial encounter for closed fracture: Secondary | ICD-10-CM | POA: Diagnosis not present

## 2022-11-14 DIAGNOSIS — S52612A Displaced fracture of left ulna styloid process, initial encounter for closed fracture: Secondary | ICD-10-CM

## 2022-11-14 MED ORDER — HYDROCODONE-ACETAMINOPHEN 5-325 MG PO TABS: 1.0000 | ORAL_TABLET | Freq: Three times a day (TID) | ORAL | 0 refills | Status: AC | PRN

## 2022-11-14 MED ORDER — KETOROLAC TROMETHAMINE 30 MG/ML IJ SOLN
30.0000 mg | Freq: Once | INTRAMUSCULAR | Status: AC
  Administered 2022-11-14: 30 mg via INTRAMUSCULAR

## 2022-11-14 MED ORDER — KETOROLAC TROMETHAMINE 30 MG/ML IJ SOLN
INTRAMUSCULAR | Status: AC
  Filled 2022-11-14: qty 1

## 2022-11-14 MED ORDER — HYDROCODONE-ACETAMINOPHEN 5-325 MG PO TABS
1.0000 | ORAL_TABLET | Freq: Once | ORAL | Status: AC
  Administered 2022-11-14: 1 via ORAL

## 2022-11-14 MED ORDER — HYDROCODONE-ACETAMINOPHEN 5-325 MG PO TABS
ORAL_TABLET | ORAL | Status: AC
  Filled 2022-11-14: qty 1

## 2022-11-14 NOTE — Progress Notes (Signed)
Orthopedic Tech Progress Note Patient Details:  Myrth Shann 22-Mar-1956 [id number]  Ortho Devices Type of Ortho Device: Sugartong splint, Arm sling Ortho Device/Splint Location: LUE Ortho Device/Splint Interventions: Ordered, Application, Adjustment   Post Interventions Patient Tolerated: Well Instructions Provided: Poper ambulation with device, Care of device, Adjustment of device  Anju Sereno A Kailani Brass 11/14/2022, 11:19 AM

## 2022-11-14 NOTE — ED Notes (Signed)
New ice added to ice pack.

## 2022-11-14 NOTE — Discharge Instructions (Addendum)
You have a fracture of your distal radius and ulna.  Leave the splint and sling in place until your follow-up with Ortho in 5 to 7 days.  I have provided their information above. You may continue to ice.  I have also sent to your pharmacy hydrocodone for your pain.  Do not drive while taking this medication.

## 2022-11-14 NOTE — ED Triage Notes (Signed)
Patient was walking into a school and tripped on a crack in the sidewalk. Patient c/o left wrist to elbow and bilateral knee pain. Patient did denies hitting her head or having LOC. Patient denies taking blood thinners.  Patient denies taking anything for her pain today.

## 2022-11-14 NOTE — ED Provider Notes (Signed)
[facility]    CSN: [id number] Arrival date & time: 11/14/22  0848      History   Chief Complaint Chief Complaint  Patient presents with   Fall    HPI Leah Lawson is a 67 y.o. female.   She is here today with her daughter with left wrist and elbow pain.  She took a fall when she suffered a mechanical fall and tripped over a curb entering to school for an award ceremony.  She fell and caught herself on her outstretched left arm.  She had immediate pain after fall.  She reports decreased range of motion and pain from her wrist all the way up to her elbow.  She denies hitting her head.   Fall    Past Medical History:  Diagnosis Date   Asthma    COPD (chronic obstructive pulmonary disease)    Diabetes mellitus without complication    "pre-diabetic"   Hypertension    Hypertensive retinopathy 10/26/2021   Open-angle glaucoma 10/26/2021    Patient Active Problem List   Diagnosis Date Noted   Lumbar disc disease 10/10/2022   Nocturnal hypoxia 07/12/2022   OSA on CPAP 02/28/2022   Contact dermatitis 02/01/2022   Vaginal itching 01/31/2022   Polycythemia 01/31/2022   Statin declined 01/31/2022   Diabetes mellitus type 2 in obese 01/31/2022   Hypertensive retinopathy 10/26/2021   Open-angle glaucoma 10/26/2021   Vitamin D deficiency 10/18/2021   Pulmonary emphysema 10/18/2021   Aortic atherosclerosis 10/18/2021   Multiple thyroid nodules 10/18/2021   Osteopenia 10/18/2021   Hyperlipidemia associated with type 2 diabetes mellitus 10/18/2021   Morbid obesity 11/25/2019   Tobacco abuse 05/08/2018   COPD exacerbation 05/08/2018   Uncontrolled hypertension    Diabetes mellitus without complication     Past Surgical History:  Procedure Laterality Date   BREAST SURGERY      OB History   No obstetric history on file.      Home Medications    Prior to Admission medications   Medication Sig Start Date End Date Taking? Authorizing Provider   HYDROcodone-acetaminophen (NORCO/VICODIN) 5-325 MG tablet Take 1 tablet by mouth every 8 (eight) hours as needed for up to 7 days. 11/14/22 11/21/22 Yes Rayvon Brandvold A, DO  albuterol (VENTOLIN HFA) 108 (90 Base) MCG/ACT inhaler INHALE 2 PUFFS BY MOUTH EVERY 6 HOURS AS NEEDED FOR WHEEZING FOR SHORTNESS OF BREATH 11/05/22   Oretha Milch, MD  COD LIVER OIL PO Take by mouth.    [provider]  dapagliflozin propanediol (FARXIGA) 5 MG TABS tablet TAKE 1 TABLET BY MOUTH ONCE DAILY BEFORE BREAKFAST 09/30/22   Henson, Vickie L, NP-C  hydrochlorothiazide (HYDRODIURIL) 25 MG tablet Take 1 tablet (25 mg total) by mouth daily. 10/03/22   Chilton Si, MD  losartan (COZAAR) 100 MG tablet Take 1 tablet (100 mg total) by mouth daily. 10/03/22   Chilton Si, MD  omeprazole (PRILOSEC) 20 MG capsule Take 1 capsule (20 mg total) by mouth daily. 10/24/22   Jenel Lucks, MD  spironolactone (ALDACTONE) 25 MG tablet Take 1 tablet (25 mg total) by mouth daily. 02/11/22   Chilton Si, MD  triamcinolone cream (KENALOG) 0.1 % Apply 1 Application topically 2 (two) times daily. 01/31/22   Henson, Vickie L, NP-C  umeclidinium-vilanterol (ANORO ELLIPTA) 62.5-25 MCG/ACT AEPB Inhale 1 puff into the lungs daily. 08/15/22   Parrett, Virgel Bouquet, NP  Vitamin D, Ergocalciferol, (DRISDOL) 1.25 MG (50000 UNIT) CAPS capsule Take 1 capsule (50,000 Units  total) by mouth every 7 (seven) days. 08/15/22   Avanell Shackleton, NP-C    Family History Family History  Problem Relation Age of Onset   Heart failure Mother    Arthritis Mother    Diabetes Mother    High blood pressure Mother    Cancer Father        prostate, lung   COPD Father    High Cholesterol Father    High blood pressure Father    Dementia Father    Diabetes Brother    High blood pressure Brother    Stroke Brother    Cancer Maternal Grandfather    Miscarriages / Stillbirths Daughter    Breast cancer Cousin    Esophageal cancer Neg Hx     Liver disease Neg Hx     Social History Social History   Tobacco Use   Smoking status: Former    Packs/day: 0.75    Years: 46.00    Additional pack years: 0.00    Total pack years: 34.50    Types: Cigarettes    Quit date: 05/05/2018    Years since quitting: 4.5    Passive exposure: Past   Smokeless tobacco: Never  Vaping Use   Vaping Use: Never used  Substance Use Topics   Alcohol use: Not Currently   Drug use: Never     Allergies   Hydralazine, Lisinopril, Singulair [montelukast sodium], and Mucinex [guaifenesin er]   Review of Systems Review of Systems as listed above in HPI   Physical Exam Triage Vital Signs ED Triage Vitals  Enc Vitals Group     BP 11/14/22 0930 116/77     Pulse Rate 11/14/22 0930 78     Resp 11/14/22 0930 20     Temp 11/14/22 0930 98 F (36.7 C)     Temp Source 11/14/22 0930 Oral     SpO2 11/14/22 0930 (!) 88 %     Weight --      Height --      Head Circumference --      Peak Flow --      Pain Score 11/14/22 0935 10     Pain Loc --      Pain Edu? --      Excl. in GC? --    No data found.  Updated Vital Signs BP 116/77 (BP Location: Right Arm)   Pulse 78   Temp 98 F (36.7 C) (Oral)   Resp 20   SpO2 (!) 88% Comment: Patient reports a history of COPD. Patient is crying and holding her breath at times.  Physical Exam Vitals reviewed.  Constitutional:      Appearance: Normal appearance. She is obese. She is not ill-appearing, toxic-appearing or diaphoretic.     Comments: Patient was crying during my exam and the visit and during her vitals being taken.  Musculoskeletal:     Comments: Patient has a subtle deformity of her left wrist with edema.  Radial pulse 2+.  She is able to move her fingers but with some pain.  Decreased range of motion with wrist flexion extension secondary to pain.  Sensation intact to light touch of her fingers.  Cap refill less than 2 seconds prior to and after sugar-tong splint was placed Elbow flexion  extension is also limited secondary to pain.    Skin:    General: Skin is warm.  Neurological:     Mental Status: She is alert.      UC Treatments / Results  Labs (all labs ordered are listed, but only abnormal results are displayed) Labs Reviewed - No data to display  EKG   Radiology DG Elbow Complete Left  Result Date: 11/14/2022 CLINICAL DATA:  Left forearm pain after fall. EXAM: LEFT ELBOW - COMPLETE 3+ VIEW COMPARISON:  None Available. FINDINGS: Mildly displaced ulnar styloid fracture is noted. Moderately displaced and comminuted distal left radial fracture is noted. No soft tissue abnormality is noted. No definite abnormality seen involving the proximal left radius or ulna or visualized distal humerus. Lateral radiograph of the elbow was not submitted for review. IMPRESSION: Mildly displaced ulnar styloid fracture. Moderately displaced and comminuted distal left radial fracture. Evaluation of left elbow is significantly limited due to lack of true lateral radiograph, but no definite abnormality is noted. Electronically Signed   By: Lupita RaiderJames  Green Jr M.D.   On: 11/14/2022 10:10   DG Wrist Complete Left  Result Date: 11/14/2022 CLINICAL DATA:  Left forearm pain after fall. EXAM: LEFT WRIST - COMPLETE 3+ VIEW COMPARISON:  None Available. FINDINGS: Mildly displaced ulnar styloid fracture is noted. Moderately displaced and comminuted distal left radial fracture is noted probable intra-articular extension. No soft tissue abnormality is noted. IMPRESSION: Moderately displaced and comminuted distal left radial fracture with probable intra-articular extension. Mildly displaced ulnar styloid fracture. Electronically Signed   By: Lupita RaiderJames  Green Jr M.D.   On: 11/14/2022 10:08    Procedures Procedures (including critical care time)  Medications Ordered in UC Medications  ketorolac (TORADOL) 30 MG/ML injection 30 mg (30 mg Intramuscular Given 11/14/22 0944)  HYDROcodone-acetaminophen  (NORCO/VICODIN) 5-325 MG per tablet 1 tablet (1 tablet Oral Given 11/14/22 1012)    Initial Impression / Assessment and Plan / UC Course  I have reviewed the triage vital signs and the nursing notes.  Pertinent labs & imaging results that were available during my care of the patient were reviewed by me and considered in my medical decision making (see chart for details).    Mechanical fall with left moderately displaced comminuted distal radius fracture and left mildly displaced ulnar styloid fracture. Patient was placed in sugar-tong splint with a sling.  I did speak with Earney HamburgMichael Jeffrey, PA-C who agreed with the plan and to have [facility] in 5 to 7 days. She received IM injection of Toradol, 30 mg and 1 tablet of 5 mg hydrocodone here during her visit.  Her daughter was her ride to and from the urgent care today.  I sent prescription hydrocodone to her pharmacy to have until she is able to follow-up with orthopedics.  I counseled her on worsening of her symptoms including increased pain inability to move or feel her fingers that she would need to report to the ER immediately.  She and her daughter verbalized understanding.  Final Clinical Impressions(s) / UC Diagnoses   Final diagnoses:  Fall, initial encounter  Traumatic closed fracture of ulnar styloid with minimal displacement, left, initial encounter  Closed Smith's fracture of left radius, initial encounter     Discharge Instructions      You have a fracture of your distal radius and ulna.  Leave the splint and sling in place until your follow-up with Ortho in 5 to 7 days.  I have provided their information above. You may continue to ice.  I have also sent to your pharmacy hydrocodone for your pain.  Do not drive while taking this medication.    ED Prescriptions     Medication Sig Dispense Auth. Provider  HYDROcodone-acetaminophen (NORCO/VICODIN) 5-325 MG tablet Take 1 tablet by mouth every 8 (eight) hours as  needed for up to 7 days. 21 tablet Gillermo Murdoch A, DO      I have reviewed the PDMP during this encounter.   Claudie Leach, DO 11/14/22 1136

## 2022-11-15 DIAGNOSIS — S52502A Unspecified fracture of the lower end of left radius, initial encounter for closed fracture: Secondary | ICD-10-CM | POA: Insufficient documentation

## 2022-11-15 HISTORY — DX: Unspecified fracture of the lower end of left radius, initial encounter for closed fracture: S52.502A

## 2022-11-18 HISTORY — PX: WRIST SURGERY: SHX841

## 2022-11-27 ENCOUNTER — Encounter (INDEPENDENT_AMBULATORY_CARE_PROVIDER_SITE_OTHER): Payer: Self-pay | Admitting: Family Medicine

## 2022-11-27 ENCOUNTER — Ambulatory Visit (INDEPENDENT_AMBULATORY_CARE_PROVIDER_SITE_OTHER): Payer: Medicare Other | Admitting: Family Medicine

## 2022-11-27 VITALS — BP 114/78 | HR 70 | Temp 98.6°F | Ht 62.0 in | Wt 241.0 lb

## 2022-11-27 DIAGNOSIS — Z1331 Encounter for screening for depression: Secondary | ICD-10-CM | POA: Diagnosis not present

## 2022-11-27 DIAGNOSIS — R0602 Shortness of breath: Secondary | ICD-10-CM

## 2022-11-27 DIAGNOSIS — I10 Essential (primary) hypertension: Secondary | ICD-10-CM | POA: Diagnosis not present

## 2022-11-27 DIAGNOSIS — G4733 Obstructive sleep apnea (adult) (pediatric): Secondary | ICD-10-CM | POA: Diagnosis not present

## 2022-11-27 DIAGNOSIS — E1169 Type 2 diabetes mellitus with other specified complication: Secondary | ICD-10-CM | POA: Diagnosis not present

## 2022-11-27 DIAGNOSIS — F32A Depression, unspecified: Secondary | ICD-10-CM | POA: Insufficient documentation

## 2022-11-27 DIAGNOSIS — Z7984 Long term (current) use of oral hypoglycemic drugs: Secondary | ICD-10-CM

## 2022-11-27 DIAGNOSIS — F3289 Other specified depressive episodes: Secondary | ICD-10-CM

## 2022-11-27 DIAGNOSIS — M519 Unspecified thoracic, thoracolumbar and lumbosacral intervertebral disc disorder: Secondary | ICD-10-CM

## 2022-11-27 DIAGNOSIS — R5383 Other fatigue: Secondary | ICD-10-CM | POA: Diagnosis not present

## 2022-11-27 DIAGNOSIS — Z6841 Body Mass Index (BMI) 40.0 and over, adult: Secondary | ICD-10-CM

## 2022-11-27 NOTE — Progress Notes (Unsigned)
Chief Complaint:   OBESITY Leah Lawson (MR# [id number]) is a 67 y.o. female who presents for evaluation and treatment of obesity and related comorbidities. Current BMI is Body mass index is 44.08 kg/m. Leah Lawson has been struggling with her weight for many years and has been unsuccessful in either losing weight, maintaining weight loss, or reaching her healthy weight goal.  Leah Lawson is retired and lives alone.  She likes to cook, she craves starches, and snacks at night.  She would like to get off of her CPAP.  She is in a left forearm cast due to a left radius fracture.  Leah Lawson is currently in the action stage of change and ready to dedicate time achieving and maintaining a healthier weight. Leah Lawson is interested in becoming our patient and working on intensive lifestyle modifications including (but not limited to) diet and exercise for weight loss.  Leah Lawson's habits were reviewed today and are as follows: Her family eats meals together, she thinks her family will eat healthier with her, her desired weight loss is 61 lbs, she has been heavy most of her life, she started gaining weight 5 years ago, her heaviest weight ever was 250 pounds, she has significant food cravings issues, she snacks frequently in the evenings, she is frequently drinking liquids with calories, she frequently makes poor food choices, she frequently eats larger portions than normal, and she struggles with emotional eating.  Depression Screen Roland's Food and Mood (modified PHQ-9) score was 17.  Subjective:   1. Other fatigue Leah Lawson admits to daytime somnolence and admits to waking up still tired. Patient has a history of symptoms of daytime fatigue and morning fatigue. Leah Lawson generally gets 6 hours of sleep per night, and states that she has nightime awakenings. Snoring is present. Apneic episodes are present. Epworth Sleepiness Score is 8.  EKG, NSR without ischemia.  2. SOBOE (shortness of breath on  exertion) [name] notes increasing shortness of breath with exercising and seems to be worsening over time with weight gain. She notes getting out of breath sooner with activity than she used to. This has not gotten worse recently. [name] denies shortness of breath at rest or orthopnea.  3. Essential hypertension Blood pressure is well-controlled.  She takes HCTZ 25 mg daily, losartan 100 mg daily, spironolactone 25 mg daily.  Patient denies chest pain or headaches.  4. Type 2 diabetes mellitus with other specified complication, unspecified whether long term insulin use Patient takes [place] 5 mg daily.  She consumes excess starches.  She previously had adverse side effects on metformin.  She has never been on a GLP-1 agonist.  5. OSA on CPAP Patient wears CPAP about 4 to 6 hours per night with nocturnal oxygen.  No big changes in energy level.  She would like to come off CPAP with weight loss.  6. Lumbar disc disease MRI of L-spine reviewed from 09/06/2022.  Patient has radicular pain to thigh on left limits ADLs.  Patient takes Tylenol currently on oxycodone for left radius fracture.  7. Other depression with emotional eating Bariatric PHQ-9:17.  Patient stress levels have been high since left forearm fracture.  Assessment/Plan:   1. Other fatigue [name] does feel that her weight is causing her energy to be lower than it should be. Fatigue may be related to obesity, depression or many other causes. Labs will be ordered, and in the meanwhile, Leah Lawson will focus on self care including making healthy food choices, increasing physical activity and focusing on stress reduction.  Update labs today.  - EKG 12-Lead - VITAMIN D 25 Hydroxy (Vit-D Deficiency, Fractures) - TSH - T4, free - T3 - Lipid panel - Insulin, random - Hemoglobin A1c - Folate - Comprehensive metabolic panel - Vitamin B12 - CBC with Differential/Platelet  2. SOBOE (shortness of breath on exertion) Leah Lawson does feel  that she gets out of breath more easily that she used to when she exercises. Leah Lawson's shortness of breath appears to be obesity related and exercise induced. She has agreed to work on weight loss and gradually increase exercise to treat her exercise induced shortness of breath. Will continue to monitor closely.  3. Essential hypertension Continue current blood pressure medications, avoid stimulants.  Check labs today.  - Lipid panel - Comprehensive metabolic panel  4. Type 2 diabetes mellitus with other specified complication, unspecified whether long term insulin use Begin prescribed meal plan, reducing sweets and starches.  Check labs today.  - Insulin, random - Hemoglobin A1c  5. OSA on CPAP Look for improvements with weight loss.  6. Lumbar disc disease Look for improvements in low back pain with weight loss.  Try gentle exercises as tolerated.  7. Other depression with emotional eating Work on mindful eating and stress reduction.  Consider Wellbutrin.  8. Depression screen Leah Lawson had a positive depression screening. Depression is commonly associated with obesity and often results in emotional eating behaviors. We will monitor this closely and work on CBT to help improve the non-hunger eating patterns. Referral to Psychology may be required if no improvement is seen as she continues in our clinic.  9. BMI 40.0-44.9, adult  10. Morbid obesity Leah Lawson is currently in the action stage of change and her goal is to continue with weight loss efforts. I recommend Leah Lawson begin the structured treatment plan as follows:  She has agreed to the Category 3 Plan +100 cal..  Exercise goals: All adults should avoid inactivity. Some physical activity is better than none, and adults who participate in any amount of physical activity gain some health benefits.   Behavioral modification strategies: increasing lean protein intake, increasing water intake, decreasing eating out, no skipping  meals, meal planning and cooking strategies, keeping healthy foods in the home, better snacking choices, and planning for success.  She was informed of the importance of frequent follow-up visits to maximize her success with intensive lifestyle modifications for her multiple health conditions. She was informed we would discuss her lab results at her next visit unless there is a critical issue that needs to be addressed sooner. Tomorrow agreed to keep her next visit at the agreed upon time to discuss these results.  Objective:   Blood pressure 114/78, pulse 70, temperature 98.6 F (37 C), height  (1.575 m), weight 241 lb (109.3 kg), SpO2 90 %. Body mass index is 44.08 kg/m.  EKG: Normal sinus rhythm, rate 71 bpm.  Indirect Calorimeter completed today shows a VO2 of 262 and a REE of 1800.  Her calculated basal metabolic rate is 0981 thus her basal metabolic rate is better than expected.  General: Cooperative, alert, well developed, in no acute distress. HEENT: Conjunctivae and lids unremarkable. Cardiovascular: Regular rhythm.  Lungs: Normal work of breathing. Neurologic: No focal deficits.   Lab Results  Component Value Date   CREATININE 0.87 09/04/2022   BUN 26 (H) 09/04/2022   NA 140 09/04/2022   K 4.3 09/04/2022   CL 105 09/04/2022   CO2 28 09/04/2022   Lab Results  Component Value Date  ALT 11 09/04/2022   AST 12 (L) 09/04/2022   ALKPHOS 69 09/04/2022   BILITOT 0.6 09/04/2022   Lab Results  Component Value Date   HGBA1C 7.7 (H) 08/14/2022   HGBA1C 7.3 03/13/2022   HGBA1C 7.5 (H) 01/31/2022   HGBA1C 7.4 (H) 10/18/2021   HGBA1C 7.2 (H) 11/26/2019   No results found for: "INSULIN" Lab Results  Component Value Date   TSH 1.07 08/14/2022   Lab Results  Component Value Date   CHOL 168 08/14/2022   HDL 37.10 (L) 08/14/2022   LDLCALC 108 (H) 08/14/2022   TRIG 115.0 08/14/2022   CHOLHDL 5 08/14/2022   Lab Results  Component Value Date   WBC 7.0 09/04/2022    HGB 16.8 (H) 09/04/2022   HCT 48.6 (H) 09/04/2022   MCV 90.8 09/04/2022   PLT 236 09/04/2022   Lab Results  Component Value Date   IRON 62 01/31/2022   TIBC 350 01/31/2022   FERRITIN 25 01/31/2022   Attestation Statements:   Reviewed by clinician on day of visit: allergies, medications, problem list, medical history, surgical history, family history, social history, and previous encounter notes.  Time spent on visit including pre-visit chart review and post-visit charting and care was 40 minutes.   I, Malcolm Metro, am acting as Energy manager for Seymour Bars, DO.  I have reviewed the above documentation for accuracy and completeness, and I agree with the above. - ***

## 2022-11-28 ENCOUNTER — Other Ambulatory Visit: Payer: Self-pay | Admitting: Pulmonary Disease

## 2022-11-28 ENCOUNTER — Telehealth (INDEPENDENT_AMBULATORY_CARE_PROVIDER_SITE_OTHER): Payer: Self-pay | Admitting: *Deleted

## 2022-11-28 NOTE — Telephone Encounter (Signed)
Phone call to patient to let her know that we did not get enough blood when we did her blood draw yesterday and she is going to need to go to [facility] to get more blood taken. Patient is aware and is going to try to go before her 2 week follow up.

## 2022-11-29 LAB — COMPREHENSIVE METABOLIC PANEL
ALT: 25 IU/L (ref 0–32)
AST: 25 IU/L (ref 0–40)
Albumin/Globulin Ratio: 1.5 (ref 1.2–2.2)
Albumin: 4.3 g/dL (ref 3.9–4.9)
Alkaline Phosphatase: 96 IU/L (ref 44–121)
BUN/Creatinine Ratio: 18 (ref 12–28)
BUN: 20 mg/dL (ref 8–27)
Bilirubin Total: 0.5 mg/dL (ref 0.0–1.2)
CO2: 24 mmol/L (ref 20–29)
Calcium: 10.1 mg/dL (ref 8.7–10.3)
Chloride: 100 mmol/L (ref 96–106)
Creatinine, Ser: 1.13 mg/dL — ABNORMAL HIGH (ref 0.57–1.00)
Globulin, Total: 2.8 g/dL (ref 1.5–4.5)
Glucose: 172 mg/dL — ABNORMAL HIGH (ref 70–99)
Potassium: 4.5 mmol/L (ref 3.5–5.2)
Sodium: 140 mmol/L (ref 134–144)
Total Protein: 7.1 g/dL (ref 6.0–8.5)
eGFR: 53 mL/min/{1.73_m2} — ABNORMAL LOW (ref >59)

## 2022-11-29 LAB — CBC WITH DIFFERENTIAL/PLATELET
Basophils Absolute: 0 10*3/uL (ref 0.0–0.2)
Basos: 0 %
EOS (ABSOLUTE): 0.1 10*3/uL (ref 0.0–0.4)
Eos: 2 %
Hematocrit: 50.4 % — ABNORMAL HIGH (ref 34.0–46.6)
Hemoglobin: 16.9 g/dL — ABNORMAL HIGH (ref 11.1–15.9)
Immature Grans (Abs): 0.1 10*3/uL (ref 0.0–0.1)
Immature Granulocytes: 1 %
Lymphocytes Absolute: 1.5 10*3/uL (ref 0.7–3.1)
Lymphs: 22 %
MCH: 30.7 pg (ref 26.6–33.0)
MCHC: 33.5 g/dL (ref 31.5–35.7)
MCV: 92 fL (ref 79–97)
Monocytes Absolute: 0.4 10*3/uL (ref 0.1–0.9)
Monocytes: 6 %
Neutrophils Absolute: 4.7 10*3/uL (ref 1.4–7.0)
Neutrophils: 69 %
Platelets: 332 10*3/uL (ref 150–450)
RBC: 5.5 x10E6/uL — ABNORMAL HIGH (ref 3.77–5.28)
RDW: 12.7 % (ref 11.7–15.4)
WBC: 6.8 10*3/uL (ref 3.4–10.8)

## 2022-11-29 LAB — LIPID PANEL
Chol/HDL Ratio: 4.6 ratio — ABNORMAL HIGH (ref 0.0–4.4)
Cholesterol, Total: 179 mg/dL (ref 100–199)
HDL: 39 mg/dL — ABNORMAL LOW (ref >39)
LDL Chol Calc (NIH): 108 mg/dL — ABNORMAL HIGH (ref 0–99)
Triglycerides: 183 mg/dL — ABNORMAL HIGH (ref 0–149)
VLDL Cholesterol Cal: 32 mg/dL (ref 5–40)

## 2022-11-29 LAB — HEMOGLOBIN A1C
Est. average glucose Bld gHb Est-mCnc: 169 mg/dL
Hgb A1c MFr Bld: 7.5 % — ABNORMAL HIGH (ref 4.8–5.6)

## 2022-11-29 LAB — INSULIN, RANDOM: INSULIN: 127 u[IU]/mL — ABNORMAL HIGH (ref 2.6–24.9)

## 2022-11-29 LAB — T3: T3, Total: 110 ng/dL (ref 71–180)

## 2022-11-29 LAB — TSH: TSH: 0.454 u[IU]/mL (ref 0.450–4.500)

## 2022-11-29 LAB — FOLATE: Folate: 8.6 ng/mL (ref >3.0)

## 2022-11-29 LAB — VITAMIN D 25 HYDROXY (VIT D DEFICIENCY, FRACTURES): Vit D, 25-Hydroxy: 35.1 ng/mL (ref 30.0–100.0)

## 2022-11-29 LAB — VITAMIN B12: Vitamin B-12: 805 pg/mL (ref 232–1245)

## 2022-11-29 LAB — T4, FREE: Free T4: 1.34 ng/dL (ref 0.82–1.77)

## 2022-12-04 ENCOUNTER — Ambulatory Visit (INDEPENDENT_AMBULATORY_CARE_PROVIDER_SITE_OTHER): Payer: Medicare Other | Admitting: Cardiovascular Disease

## 2022-12-04 ENCOUNTER — Encounter (HOSPITAL_BASED_OUTPATIENT_CLINIC_OR_DEPARTMENT_OTHER): Payer: Self-pay | Admitting: Cardiovascular Disease

## 2022-12-04 VITALS — BP 122/77 | HR 98 | Ht 62.0 in | Wt 244.2 lb

## 2022-12-04 DIAGNOSIS — E1169 Type 2 diabetes mellitus with other specified complication: Secondary | ICD-10-CM

## 2022-12-04 DIAGNOSIS — Z5181 Encounter for therapeutic drug level monitoring: Secondary | ICD-10-CM

## 2022-12-04 DIAGNOSIS — G4733 Obstructive sleep apnea (adult) (pediatric): Secondary | ICD-10-CM | POA: Diagnosis not present

## 2022-12-04 DIAGNOSIS — I700 Atherosclerosis of aorta: Secondary | ICD-10-CM | POA: Diagnosis not present

## 2022-12-04 DIAGNOSIS — I10 Essential (primary) hypertension: Secondary | ICD-10-CM

## 2022-12-04 DIAGNOSIS — E785 Hyperlipidemia, unspecified: Secondary | ICD-10-CM

## 2022-12-04 MED ORDER — ROSUVASTATIN CALCIUM 10 MG PO TABS: ORAL_TABLET | ORAL | 3 refills | Status: DC

## 2022-12-04 NOTE — Assessment & Plan Note (Signed)
Encouraged her to continue using CPAP.  Oxygen was recently added due to polycythemia.

## 2022-12-04 NOTE — Assessment & Plan Note (Signed)
Noted on CT.  Adding rosuvastatin as above.

## 2022-12-04 NOTE — Assessment & Plan Note (Signed)
She is willing to try rosuvastatin 10 mg on Monday Wednesday and Friday.  Repeat lipids and a CMP in 2 to 3 months.

## 2022-12-04 NOTE — Assessment & Plan Note (Signed)
Blood pressure has been very well-controlled since adding spironolactone.  Continue HCTZ and losartan.  Recommend that she work on increasing her exercise as able.

## 2022-12-04 NOTE — Progress Notes (Signed)
[facility] Follow up:    Date:  12/04/2022   ID:  Leah Lawson, DOB 02-Sep-1955, MRN [id number]  PCP:  Avanell Shackleton, NP-C  Cardiologist:  None   Referring MD: Avanell Shackleton, NP-C   CC: Hypertension  History of Present Illness:    Leah Lawson is a 67 y.o. female with a hx of hypertension, hyperlipidemia, diabetes, COPD, asthma, and hypertensive retinopathy, her for follow up . She was first seen in [facility] 02/2022. She saw her PCP on 10/18/21 and her blood pressure was 144/92. Her blood pressure had been uncontrolled for quite some time. She has tried several antihypertensives but did not tolerate most of them. She was kept on losartan/HCTZ and referred to the [facility]. She followed up with her PCP on 01/31/2022 and her blood pressure was 130/90. It was noted that she did not wish to start any statin medications.  She had gained 40 lb and was not very active so she was referred for PREP. We recommend limiting her sodium intake and started spirolactone. She was referred to a diabetes nutritionist. She was hesitant to take statin and this was deferred at the time. She has been seen by [facility]. Recent blood pressures have been well controlled.   Today, she has been monitoring her blood pressure at home and brought her log with her today. She has systolic readings from 110-120s and occasionally in the 90s. She reports episodes of skipped heart beats. She notices it most when she is sitting down. These episodes have been occurring since she had Covid for a 2nd time. She is compliant with her CPAP machine. She tends to still feel tired in the mornings. She reports a recent fall that resulted in a wrist fracture. She recently had a cast put on it and has to wear it for 2 weeks. She will be following up with PT soon. She continues to be seen by [facility]. She was encouraged to eat more cheese, eggs, and  other foods high in protein. She denies any palpitations, chest pain, shortness of breath, or peripheral edema. No lightheadedness, headaches, syncope, orthopnea, or PND.   Previous antihypertensives: Lisinopril Hydralazine Amlodipine The patient's  Past Medical History:  Diagnosis Date   Asthma    Back pain    Constipation    COPD (chronic obstructive pulmonary disease) (HCC)    COPD (chronic obstructive pulmonary disease) (HCC)    Diabetes (HCC)    Diabetes mellitus without complication (HCC)    "pre-diabetic"   Glaucoma    Heartburn    High cholesterol    Hypertension    Hypertensive retinopathy 10/26/2021   Lactose intolerance    Open-angle glaucoma 10/26/2021   Other specified disorders of thyroid    Sleep apnea    SOB (shortness of breath)    Vitamin D deficiency     Past Surgical History:  Procedure Laterality Date   BREAST SURGERY      Current Medications: Current Meds  Medication Sig   albuterol (VENTOLIN HFA) 108 (90 Base) MCG/ACT inhaler INHALE 2 PUFFS BY MOUTH EVERY 6 HOURS AS NEEDED FOR WHEEZING AND FOR SHORTNESS OF BREATH   COD LIVER OIL PO Take by mouth.   dapagliflozin propanediol (FARXIGA) 5 MG TABS tablet TAKE 1 TABLET BY MOUTH ONCE DAILY BEFORE BREAKFAST   losartan-hydrochlorothiazide (HYZAAR) 100-25 MG tablet Take 1 tablet by mouth daily.   omeprazole (PRILOSEC) 20 MG capsule Take 1 capsule (20  mg total) by mouth daily.   oxyCODONE-acetaminophen (PERCOCET) 10-325 MG tablet Take 1 tablet by mouth as needed for pain.   rosuvastatin (CRESTOR) 10 MG tablet TAKE 1 TABLET MONDAY, WEDNESDAY, AND FRIDAY ONLY   spironolactone (ALDACTONE) 25 MG tablet Take 1 tablet (25 mg total) by mouth daily.   triamcinolone cream (KENALOG) 0.1 % Apply 1 Application topically 2 (two) times daily.   umeclidinium-vilanterol (ANORO ELLIPTA) 62.5-25 MCG/ACT AEPB Inhale 1 puff into the lungs daily.     Allergies:   Hydralazine, Lisinopril, Singulair [montelukast sodium], and  Mucinex [guaifenesin er]   Social History   Socioeconomic History   Marital status: Single    Spouse name: Not on file   Number of children: 4   Years of education: Not on file   Highest education level: Not on file  Occupational History   Occupation: retired  Tobacco Use   Smoking status: Former    Packs/day: 0.75    Years: 46.00    Additional pack years: 0.00    Total pack years: 34.50    Types: Cigarettes    Quit date: 05/05/2018    Years since quitting: 4.5    Passive exposure: Past   Smokeless tobacco: Never  Vaping Use   Vaping Use: Never used  Substance and Sexual Activity   Alcohol use: Not Currently   Drug use: Never   Sexual activity: Not on file  Other Topics Concern   Not on file  Social History Narrative   Not on file   Social Determinants of Health   Financial Resource Strain: Not on file  Food Insecurity: Not on file  Transportation Needs: Not on file  Physical Activity: Not on file  Stress: Not on file  Social Connections: Not on file     Family History: The patient's family history includes Arthritis in her mother; Breast cancer in her cousin; COPD in her father; Cancer in her father and maternal grandfather; Dementia in her father; Diabetes in her brother and mother; Heart failure in her mother; High Cholesterol in her father; High blood pressure in her brother, father, and mother; Miscarriages / [place] in her daughter; Stroke in her brother. There is no history of Esophageal cancer or Liver disease.  ROS:   Please see the history of present illness.    (+) skipped beats (PVCs) All other systems reviewed and are negative.  EKGs/Labs/Other Studies Reviewed:    CT Chest  06/05/2020: FINDINGS: Cardiovascular: No significant coronary artery calcification. Global cardiac size within normal limits. No pericardial effusion. Central pulmonary arteries are enlarged in keeping with changes of pulmonary arterial hypertension. This has progressed  slightly since prior examination. Mild atherosclerotic calcification is seen within the thoracic aorta. No aortic aneurysm.   Mediastinum/Nodes: 21 mm nodule is seen within the left thyroid lobe, enlarged since prior examination. No pathologic thoracic adenopathy. The esophagus is unremarkable.   Lungs/Pleura: Moderate emphysematous changes are again identified within the lungs. Superimposed scattered areas of parenchymal scarring are stable since prior examination. No focal pulmonary nodules or confluent pulmonary infiltrates are identified. No pneumothorax or pleural effusion. The central airways are widely patent.   Upper Abdomen: 9 mm nonobstructing calculus is seen within the visualized upper pole of the right kidney. Small hiatal hernia noted.   Musculoskeletal: No lytic or blastic bone lesion identified. Osseous structures are age-appropriate.   IMPRESSION: Stable changes of emphysema with superimposed scattered areas of parenchymal scarring. No focal pulmonary nodule identified.   Morphologic changes of pulmonary arterial  hypertension, slightly progressed since prior examination.   21 mm left thyroid nodule. A underlying benign etiology is suspected given its relatively slow interval growth since remote prior examination. This is, however, not definitively characterized on this examination and would meet criteria for dedicated thyroid sonography if clinically indicated. (ref: J Am Coll Radiol. 2015 Feb;12(2): 143-50).   Nonobstructing right nephrolithiasis, incompletely evaluated.   Aortic Atherosclerosis (ICD10-I70.0) and Emphysema (ICD10-J43.9).   EKG:  EKG is personally reviewed. 12/04/2022: EKG was not ordered. 02/11/2022: Sinus rhythm. Rate 86 bpm.  Recent Labs: 11/28/2022: ALT 25; BUN 20; Creatinine, Ser 1.13; Hemoglobin 16.9; Platelets 332; Potassium 4.5; Sodium 140; TSH 0.454   Recent Lipid Panel    Component Value Date/Time   CHOL 179 11/28/2022 1507    TRIG 183 (H) 11/28/2022 1507   HDL 39 (L) 11/28/2022 1507   CHOLHDL 4.6 (H) 11/28/2022 1507   CHOLHDL 5 08/14/2022 1051   VLDL 23.0 08/14/2022 1051   LDLCALC 108 (H) 11/28/2022 1507    Physical Exam:    VS:  BP 122/77 (BP Location: Right Arm, Patient Position: Sitting, Cuff Size: Large)   Pulse 98   Ht 5\' 2"  (1.575 m)   Wt 244 lb 3.2 oz (110.8 kg)   SpO2 93%   BMI 44.66 kg/m  , BMI Body mass index is 44.66 kg/m. GENERAL:  Well appearing HEENT: Pupils equal round and reactive, fundi not visualized, oral mucosa unremarkable NECK:  No jugular venous distention, waveform within normal limits, carotid upstroke brisk and symmetric, no bruits, no thyromegaly LYMPHATICS:  No cervical adenopathy LUNGS:  Clear to auscultation bilaterally HEART:  RRR.  PMI not displaced or sustained,S1 and S2 within normal limits, no S3, no S4, no clicks, no rubs, no murmurs ABD:  Flat, positive bowel sounds normal in frequency in pitch, no bruits, no rebound, no guarding, no midline pulsatile mass, no hepatomegaly, no splenomegaly EXT:  2 plus pulses throughout, no edema, no cyanosis no clubbing SKIN:  No rashes no nodules, Ecchymosis of left shin NEURO:  Cranial nerves II through XII grossly intact, motor grossly intact throughout PSYCH:  Cognitively intact, oriented to person place and time   ASSESSMENT/PLAN:    Essential hypertension Blood pressure has been very well-controlled since adding spironolactone.  Continue HCTZ and losartan.  Recommend that she work on increasing her exercise as able.  OSA on CPAP Encouraged her to continue using CPAP.  Oxygen was recently added due to polycythemia.  Hyperlipidemia associated with type 2 diabetes mellitus (HCC) She is willing to try rosuvastatin 10 mg on Monday Wednesday and Friday.  Repeat lipids and a CMP in 2 to 3 months.  Aortic atherosclerosis (HCC) Noted on CT.  Adding rosuvastatin as above.    Screening for Secondary Hypertension:      02/11/2022    3:56 PM  Causes  Drugs/Herbals Screened     - Comments 1-2 caffeinated drink daily.  Limits salt.  Rare Aleve.  Renovascular HTN N/A  Sleep Apnea Screened     - Comments Uses CPAP  Thyroid Disease Screened  Hyperaldosteronism N/A  Pheochromocytoma N/A    Relevant Labs/Studies:    Latest Ref Rng & Units 11/28/2022    3:07 PM 09/04/2022    1:32 PM 08/14/2022   10:51 AM  Basic Labs  Sodium 134 - 144 mmol/L 140  140  143   Potassium 3.5 - 5.2 mmol/L 4.5  4.3  4.5   Creatinine 0.57 - 1.00 mg/dL 4.40  3.47  4.25  Latest Ref Rng & Units 11/28/2022    3:07 PM 08/14/2022   10:51 AM  Thyroid   TSH 0.450 - 4.500 uIU/mL 0.454  1.07                  Disposition:    FU with Mardi Cannady C. Duke Salvia, MD, [facility] in 1 year -Lab work in 2-3 months  -Start 10 mg rosuvastatin MWF   Medication Adjustments/Labs and Tests Ordered: Current medicines are reviewed at length with the patient today.  Concerns regarding medicines are outlined above.   Orders Placed This Encounter  Procedures   Lipid panel   Comprehensive metabolic panel   Meds ordered this encounter  Medications   rosuvastatin (CRESTOR) 10 MG tablet    Sig: TAKE 1 TABLET MONDAY, WEDNESDAY, AND FRIDAY ONLY    Dispense:  40 tablet    Refill:  3     I,Rachel Rivera,acting as a scribe for Chilton Si, MD.,have documented all relevant documentation on the behalf of Chilton Si, MD,as directed by  Chilton Si, MD while in the presence of Chilton Si, MD.  I, Dilyn Smiles C. Duke Salvia, MD have reviewed all documentation for this visit.  The documentation of the exam, diagnosis, procedures, and orders on 12/04/2022 are all accurate and complete.    Signed, Chilton Si, MD  12/04/2022 12:24 PM    [facility]

## 2022-12-04 NOTE — Patient Instructions (Signed)
Medication Instructions:  START ROSUVASTATIN 10 MG MONDAY, WEDNESDAY, AND FRIDAY ONLY   *If you need a refill on your cardiac medications before your next appointment, please call your pharmacy*  Lab Work: FASTING LP/CMET IN 2-3 MONTHS   If you have labs (blood work) drawn today and your tests are completely normal, you will receive your results only by: MyChart Message (if you have MyChart) OR A paper copy in the mail If you have any lab test that is abnormal or we need to change your treatment, we will call you to review the results.  Testing/Procedures: NONE  Follow-Up: At [facility], you and your health needs are our priority.  As part of our continuing mission to provide you with exceptional heart care, we have created designated Provider Care Teams.  These Care Teams include your primary Cardiologist (physician) and Advanced Practice Providers (APPs -  Physician Assistants and Nurse Practitioners) who all work together to provide you with the care you need, when you need it.  We recommend signing up for the patient portal called "MyChart".  Sign up information is provided on this After Visit Summary.  MyChart is used to connect with patients for Virtual Visits (Telemedicine).  Patients are able to view lab/test results, encounter notes, upcoming appointments, etc.  Non-urgent messages can be sent to your provider as well.   To learn more about what you can do with MyChart, go to [email or web address].    Your next appointment:   12 month(s)  Provider:   Chilton Si, MD

## 2022-12-06 ENCOUNTER — Encounter: Payer: Medicare Other | Admitting: Gastroenterology

## 2022-12-11 ENCOUNTER — Ambulatory Visit (INDEPENDENT_AMBULATORY_CARE_PROVIDER_SITE_OTHER): Payer: Medicare Other | Admitting: Family Medicine

## 2022-12-11 ENCOUNTER — Encounter (INDEPENDENT_AMBULATORY_CARE_PROVIDER_SITE_OTHER): Payer: Self-pay | Admitting: Family Medicine

## 2022-12-11 VITALS — BP 119/76 | HR 79 | Temp 97.9°F | Ht 62.0 in | Wt 237.0 lb

## 2022-12-11 DIAGNOSIS — N1831 Chronic kidney disease, stage 3a: Secondary | ICD-10-CM | POA: Insufficient documentation

## 2022-12-11 DIAGNOSIS — E88819 Insulin resistance, unspecified: Secondary | ICD-10-CM

## 2022-12-11 DIAGNOSIS — E1169 Type 2 diabetes mellitus with other specified complication: Secondary | ICD-10-CM | POA: Diagnosis not present

## 2022-12-11 DIAGNOSIS — E559 Vitamin D deficiency, unspecified: Secondary | ICD-10-CM | POA: Diagnosis not present

## 2022-12-11 DIAGNOSIS — Z6841 Body Mass Index (BMI) 40.0 and over, adult: Secondary | ICD-10-CM

## 2022-12-11 NOTE — Assessment & Plan Note (Signed)
Last vitamin D Lab Results  Component Value Date   VD25OH 35.1 11/28/2022   Reviewed lab with pt from last visit.  We discussed a target vitamin D level 50-70 to help with leptin resistance, immune function, bone health and energy levels.  She reports itching while recently on RX vitamin D and remains off vitamin D currently.  Recommend OTC vitamin D3 4,000 IU daily.  Call if itching occurs. Recheck level in 6 mos

## 2022-12-11 NOTE — Assessment & Plan Note (Signed)
New Reviewed lab from last visit Cr increased to 1.13 and GFR dropped to 53 likely from increased NSAID use. She remains in a L forearm cast and has follow up scheduled with orthopedics  Reduce frequency of NSAID use from daily to prn if possible  Recheck renal function in the next 1-2 mos Hydrate well with water F/u with ortho to discuss other pain control treatment options if needed

## 2022-12-11 NOTE — Progress Notes (Signed)
Office: [phone]  /  Fax: [phone]  WEIGHT SUMMARY AND BIOMETRICS  Starting Date: 11/27/22  Starting Weight: 241lb   Weight Lost Since Last Visit: 4lb   Vitals Temp: 97.9 F (36.6 C) BP: 119/76 Pulse Rate: 79 SpO2: 94 %   Body Composition  Body Fat %: 47.9 % Fat Mass (lbs): 113.6 lbs Muscle Mass (lbs): 117.4 lbs Total Body Water (lbs): 78.6 lbs     HPI  Chief Complaint: OBESITY  Leah Lawson is here to discuss her progress with her obesity treatment plan. She is on the the Category 3 Plan and states she is following her eating plan approximately 97 % of the time. She states she is exercising 0 minutes 0 times per week.   Interval History:  Since last office visit she is down 4 lb in the past 2 weeks She has financial strain She has maintained her muscle mass and lost 4.4 lb of body fat There are times that she still feels hungry She is using air popped popcorn for snacks She is struggling to get in all of the meat at dinner  2 oz meal alternative handout given   Pharmacotherapy: none  PHYSICAL EXAM:  Blood pressure 119/76, pulse 79, temperature 97.9 F (36.6 C), height 5\' 2"  (1.575 m), weight 237 lb (107.5 kg), SpO2 94 %. Body mass index is 43.35 kg/m.  General: She is overweight, cooperative, alert, well developed, and in no acute distress. PSYCH: Has normal mood, affect and thought process.   Lungs: Normal breathing effort, no conversational dyspnea.   ASSESSMENT AND PLAN  TREATMENT PLAN FOR OBESITY:  Recommended Dietary Goals  Leah Lawson is currently in the action stage of change. As such, her goal is to continue weight management plan. She has agreed to the Category 3 Plan.  Behavioral Intervention  We discussed the following Behavioral Modification Strategies today: increasing lean protein intake, decreasing simple carbohydrates , increasing vegetables, increasing lower glycemic fruits, increasing fiber rich foods, avoiding skipping  meals, increasing water intake, work on managing stress, creating time for self-care and relaxation measures, continue to work on implementation of reduced calorie nutritional plan, continue to practice mindfulness when eating, and planning for success.  Additional resources provided today: NA  Recommended Physical Activity Goals  Leah Lawson has been advised to work up to 150 minutes of moderate intensity aerobic activity a week and strengthening exercises 2-3 times per week for cardiovascular health, weight loss maintenance and preservation of muscle mass.   She has agreed to Think about ways to increase physical activity  Pharmacotherapy changes for the treatment of obesity:   ASSOCIATED CONDITIONS ADDRESSED TODAY  Vitamin D deficiency Assessment & Plan: Last vitamin D Lab Results  Component Value Date   VD25OH 35.1 11/28/2022   Reviewed lab with pt from last visit.  We discussed a target vitamin D level 50-70 to help with leptin resistance, immune function, bone health and energy levels.  She reports itching while recently on RX vitamin D and remains off vitamin D currently.  Recommend OTC vitamin D3 4,000 IU daily.  Call if itching occurs. Recheck level in 6 mos   Morbid obesity (HCC) with starting BMI 44  BMI 40.0-44.9, adult (HCC)  Type 2 diabetes mellitus with obesity (HCC) Assessment & Plan: Lab Results  Component Value Date   HGBA1C 7.5 (H) 11/28/2022   We reviewed her labs from last visit.  Fair control over type II diabetes on Farxiga 5 mg daily.   Denies low blood sugars Doing well  with prescribed diet, reducing added sugar, excess snacks and reducing starch intake  Reviewed serving sizes of fruits and recommend limiting tropical fruits for now due to higher sugar content. Has room for more regular walking  Continue current medications.  Consider the addition of a GLP -1 receptor agonist. Continue prescribed diet   Insulin resistance Assessment &  Plan: Reviewed lab from last visit She has significant insulin resistance with a fasting insulin of 127 and signs of metabolic syndrome with a high TG and low HDL.   She has lost 4 lb of body fat in 4 weeks with a low sugar/ lower carb diet.  Continue current dietary changes Plan to add in more physical activity to aid in insulin sensitivity   Stage 3a chronic kidney disease (HCC) Assessment & Plan: New Reviewed lab from last visit Cr increased to 1.13 and GFR dropped to 53 likely from increased NSAID use. She remains in a L forearm cast and has follow up scheduled with orthopedics  Reduce frequency of NSAID use from daily to prn if possible  Recheck renal function in the next 1-2 mos Hydrate well with water F/u with ortho to discuss other pain control treatment options if needed       She was informed of the importance of frequent follow up visits to maximize her success with intensive lifestyle modifications for her multiple health conditions.   ATTESTASTION STATEMENTS:  Reviewed by clinician on day of visit: allergies, medications, problem list, medical history, surgical history, family history, social history, and previous encounter notes pertinent to obesity diagnosis.   I have personally spent 30 minutes total time today in preparation, patient care, nutritional counseling and documentation for this visit, including the following: review of clinical lab tests; review of medical tests/procedures/services.      Glennis Brink, DO DABFM, DABOM [facility] [address] [place], [place] [address] [phone]

## 2022-12-11 NOTE — Assessment & Plan Note (Signed)
Reviewed lab from last visit She has significant insulin resistance with a fasting insulin of 127 and signs of metabolic syndrome with a high TG and low HDL.   She has lost 4 lb of body fat in 4 weeks with a low sugar/ lower carb diet.  Continue current dietary changes Plan to add in more physical activity to aid in insulin sensitivity

## 2022-12-11 NOTE — Assessment & Plan Note (Signed)
Lab Results  Component Value Date   HGBA1C 7.5 (H) 11/28/2022   We reviewed her labs from last visit.  Fair control over type II diabetes on Farxiga 5 mg daily.   Denies low blood sugars Doing well with prescribed diet, reducing added sugar, excess snacks and reducing starch intake  Reviewed serving sizes of fruits and recommend limiting tropical fruits for now due to higher sugar content. Has room for more regular walking  Continue current medications.  Consider the addition of a GLP -1 receptor agonist. Continue prescribed diet

## 2022-12-17 ENCOUNTER — Encounter: Payer: Self-pay | Admitting: Family Medicine

## 2022-12-17 ENCOUNTER — Ambulatory Visit (INDEPENDENT_AMBULATORY_CARE_PROVIDER_SITE_OTHER): Payer: Medicare Other | Admitting: Family Medicine

## 2022-12-17 VITALS — BP 132/74 | HR 80 | Temp 97.6°F | Ht 62.0 in | Wt 240.0 lb

## 2022-12-17 DIAGNOSIS — E1169 Type 2 diabetes mellitus with other specified complication: Secondary | ICD-10-CM | POA: Diagnosis not present

## 2022-12-17 DIAGNOSIS — I700 Atherosclerosis of aorta: Secondary | ICD-10-CM

## 2022-12-17 DIAGNOSIS — I10 Essential (primary) hypertension: Secondary | ICD-10-CM | POA: Diagnosis not present

## 2022-12-17 DIAGNOSIS — N898 Other specified noninflammatory disorders of vagina: Secondary | ICD-10-CM | POA: Insufficient documentation

## 2022-12-17 DIAGNOSIS — E785 Hyperlipidemia, unspecified: Secondary | ICD-10-CM

## 2022-12-17 DIAGNOSIS — E669 Obesity, unspecified: Secondary | ICD-10-CM

## 2022-12-17 DIAGNOSIS — G4733 Obstructive sleep apnea (adult) (pediatric): Secondary | ICD-10-CM | POA: Diagnosis not present

## 2022-12-17 DIAGNOSIS — Z7984 Long term (current) use of oral hypoglycemic drugs: Secondary | ICD-10-CM

## 2022-12-17 DIAGNOSIS — R0602 Shortness of breath: Secondary | ICD-10-CM

## 2022-12-17 DIAGNOSIS — L304 Erythema intertrigo: Secondary | ICD-10-CM

## 2022-12-17 MED ORDER — FLUCONAZOLE 150 MG PO TABS
ORAL_TABLET | ORAL | 0 refills | Status: DC
Start: 2022-12-17 — End: 2023-01-28

## 2022-12-17 MED ORDER — CLOTRIMAZOLE-BETAMETHASONE 1-0.05 % EX CREA
1.0000 | TOPICAL_CREAM | Freq: Every day | CUTANEOUS | 0 refills | Status: DC
Start: 2022-12-17 — End: 2024-02-16

## 2022-12-17 NOTE — Assessment & Plan Note (Signed)
On statin therapy 

## 2022-12-17 NOTE — Assessment & Plan Note (Signed)
Declines exam. Diflucan prescribed to take every 3rd day x 2 weeks for presumed yeast infection and intertrigo.

## 2022-12-17 NOTE — Progress Notes (Signed)
Subjective:     Patient ID: Leah Lawson, female    DOB: Dec 04, 1955, 67 y.o.   MRN: [id number]  Chief Complaint  Patient presents with   Diabetes    Would like to discuss handicap placard for breathing.   Rash    Rash under her breasts, would also like to discuss constant itching    HPI Patient is in today for follow up on multiple chronic health conditions including DM.   She is now seeing [facility]. Recent labs done.   Other providers: Pulmonologist  Cardiologist GI  She also has concerns regarding itching beneath her breasts, in her groin area and vaginal itching.   Requests handicap placard.    Health Maintenance Due  Topic Date Due   FOOT EXAM  Never done   COLONOSCOPY (Pts 45-62yrs Insurance coverage will need to be confirmed)  Never done   Zoster Vaccines- Shingrix (1 of 2) Never done   DTaP/Tdap/Td (2 - Td or Tdap) 08/20/2017   Pneumonia Vaccine 56+ Years old (2 of 2 - PCV) 05/11/2019    Past Medical History:  Diagnosis Date   Asthma    Back pain    Constipation    COPD (chronic obstructive pulmonary disease) (HCC)    COPD (chronic obstructive pulmonary disease) (HCC)    Diabetes (HCC)    Diabetes mellitus without complication (HCC)    "pre-diabetic"   Glaucoma    Heartburn    High cholesterol    Hypertension    Hypertensive retinopathy 10/26/2021   Lactose intolerance    Open-angle glaucoma 10/26/2021   Other specified disorders of thyroid    Sleep apnea    SOB (shortness of breath)    Vitamin D deficiency     Past Surgical History:  Procedure Laterality Date   BREAST SURGERY      Family History  Problem Relation Age of Onset   Heart failure Mother    Arthritis Mother    Diabetes Mother    High blood pressure Mother    Cancer Father        prostate, lung   COPD Father    High Cholesterol Father    High blood pressure Father    Dementia Father    Diabetes Brother    High blood pressure Brother    Stroke Brother    Cancer Maternal  Grandfather    Miscarriages / Stillbirths Daughter    Breast cancer Cousin    Esophageal cancer Neg Hx    Liver disease Neg Hx     Social History   Socioeconomic History   Marital status: Single    Spouse name: Not on file   Number of children: 4   Years of education: Not on file   Highest education level: Master's degree (e.g., MA, MS, MEng, MEd, MSW, MBA)  Occupational History   Occupation: retired  Tobacco Use   Smoking status: Former    Packs/day: 0.75    Years: 46.00    Additional pack years: 0.00    Total pack years: 34.50    Types: Cigarettes    Quit date: 05/05/2018    Years since quitting: 4.6    Passive exposure: Past   Smokeless tobacco: Never  Vaping Use   Vaping Use: Never used  Substance and Sexual Activity   Alcohol use: Not Currently   Drug use: Never   Sexual activity: Not on file  Other Topics Concern   Not on file  Social History Narrative   Not on  file   Social Determinants of Health   Financial Resource Strain: Low Risk  (12/16/2022)   Overall Financial Resource Strain (CARDIA)    Difficulty of Paying Living Expenses: Not hard at all  Food Insecurity: No Food Insecurity (12/16/2022)   Hunger Vital Sign    Worried About Running Out of Food in the Last Year: Never true    Ran Out of Food in the Last Year: Never true  Transportation Needs: No Transportation Needs (12/16/2022)   PRAPARE - Administrator, Civil Service (Medical): No    Lack of Transportation (Non-Medical): No  Physical Activity: Insufficiently Active (12/16/2022)   Exercise Vital Sign    Days of Exercise per Week: 4 days    Minutes of Exercise per Session: 20 min  Stress: No Stress Concern Present (12/16/2022)   [employer] of Occupational Health - Occupational Stress Questionnaire    Feeling of Stress : Not at all  Social Connections: Moderately Integrated (12/16/2022)   Social Connection and Isolation Panel [NHANES]    Frequency of Communication with Friends and  Family: More than three times a week    Frequency of Social Gatherings with Friends and Family: Twice a week    Attends Religious Services: More than 4 times per year    Active Member of [employer] or Organizations: Yes    Attends Engineer, structural: More than 4 times per year    Marital Status: Never married  Catering manager Violence: Not on file    Outpatient Medications Prior to Visit  Medication Sig Dispense Refill   albuterol (VENTOLIN HFA) 108 (90 Base) MCG/ACT inhaler INHALE 2 PUFFS BY MOUTH EVERY 6 HOURS AS NEEDED FOR WHEEZING AND FOR SHORTNESS OF BREATH 9 g 0   dapagliflozin propanediol (FARXIGA) 5 MG TABS tablet TAKE 1 TABLET BY MOUTH ONCE DAILY BEFORE BREAKFAST 30 tablet 2   losartan-hydrochlorothiazide (HYZAAR) 100-25 MG tablet Take 1 tablet by mouth daily.     omeprazole (PRILOSEC) 20 MG capsule Take 1 capsule (20 mg total) by mouth daily. 90 capsule 3   oxyCODONE-acetaminophen (PERCOCET) 10-325 MG tablet Take 1 tablet by mouth as needed for pain.     rosuvastatin (CRESTOR) 10 MG tablet TAKE 1 TABLET MONDAY, WEDNESDAY, AND FRIDAY ONLY 40 tablet 3   spironolactone (ALDACTONE) 25 MG tablet Take 1 tablet (25 mg total) by mouth daily. 90 tablet 3   triamcinolone cream (KENALOG) 0.1 % Apply 1 Application topically 2 (two) times daily. 30 g 0   umeclidinium-vilanterol (ANORO ELLIPTA) 62.5-25 MCG/ACT AEPB Inhale 1 puff into the lungs daily. 60 each 5   No facility-administered medications prior to visit.    Allergies  Allergen Reactions   Hydralazine Swelling    In feet and ankle   Lisinopril Cough   Singulair [Montelukast Sodium] Other (See Comments)    headaches   Mucinex [Guaifenesin Er] Diarrhea and Rash    Review of Systems  Constitutional:  Negative for chills and fever.  Respiratory:  Positive for shortness of breath.   Cardiovascular:  Negative for chest pain, palpitations and leg swelling.  Gastrointestinal:  Negative for abdominal pain, constipation,  diarrhea, nausea and vomiting.  Genitourinary:  Negative for dysuria, frequency and urgency.  Musculoskeletal:  Positive for back pain and joint pain.  Neurological:  Negative for dizziness.       Objective:    Physical Exam Constitutional:      General: She is not in acute distress.    Appearance: She is obese.  She is not ill-appearing.  HENT:     Mouth/Throat:     Mouth: Mucous membranes are moist.  Eyes:     Extraocular Movements: Extraocular movements intact.     Conjunctiva/sclera: Conjunctivae normal.  Cardiovascular:     Rate and Rhythm: Normal rate.  Pulmonary:     Effort: Pulmonary effort is normal.  Musculoskeletal:     Cervical back: Normal range of motion and neck supple.  Skin:    General: Skin is warm and dry.  Neurological:     General: No focal deficit present.     Mental Status: She is alert and oriented to person, place, and time.  Psychiatric:        Mood and Affect: Mood normal.        Behavior: Behavior normal.        Thought Content: Thought content normal.     BP 132/74 (BP Location: Left Arm, Patient Position: Sitting, Cuff Size: Large)   Pulse 80   Temp 97.6 F (36.4 C) (Temporal)   Ht 5\' 2"  (1.575 m)   Wt 240 lb (108.9 kg)   SpO2 93%   BMI 43.90 kg/m  Wt Readings from Last 3 Encounters:  12/17/22 240 lb (108.9 kg)  12/11/22 237 lb (107.5 kg)  12/04/22 244 lb 3.2 oz (110.8 kg)       Assessment & Plan:   Problem List Items Addressed This Visit       Cardiovascular and Mediastinum   Aortic atherosclerosis (HCC)    On statin therapy.       Essential hypertension    Reviewed Dr. Leonides Sake note. Continue current medication regimen.         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 Educated her to keeping areas clean and dry. Do not wear bra at night.       Relevant Medications   fluconazole (DIFLUCAN) 150 MG tablet   clotrimazole-betamethasone (LOTRISONE) cream     Genitourinary   Itching in the vaginal area    Declines exam. Diflucan prescribed to take every 3rd day x 2 weeks for presumed yeast infection and intertrigo.       Relevant Medications   fluconazole (DIFLUCAN) 150 MG tablet     Other   SOBOE (shortness of breath on exertion)    I am having Ezme Amason start on fluconazole and clotrimazole-betamethasone. I am also having her maintain her triamcinolone cream, spironolactone, Anoro Ellipta, Farxiga, omeprazole, albuterol, losartan-hydrochlorothiazide, oxyCODONE-acetaminophen, and rosuvastatin.  Meds ordered this encounter  Medications   fluconazole (DIFLUCAN) 150 MG tablet    Sig: Take 1 tablet (150 mg) every 3rd day x 2 weeks.    Dispense:  6 tablet    Refill:  0    Order Specific Question:   Supervising Provider    Answer:   Hillard Danker A [[id number]]   clotrimazole-betamethasone (LOTRISONE) cream    Sig: Apply 1 Application topically daily.    Dispense:  30 g    Refill:  0    Order Specific Question:   Supervising Provider    Answer:   Hillard Danker A [[id number]]

## 2022-12-17 NOTE — Assessment & Plan Note (Signed)
Reviewed Dr. Leonides Sake note. Continue current medication regimen.

## 2022-12-17 NOTE — Assessment & Plan Note (Signed)
Topical Lotrisone and oral diflucan prescribed. Educated her to keeping areas clean and dry. Do not wear bra at night.

## 2022-12-17 NOTE — Patient Instructions (Addendum)
Keep the areas under your breasts and in your groin clean and dry.  Use the topical antifungal cream daily x 2 weeks but do not use in the vaginal area, only in the skin folds.   Take the oral antifungal medication (diflucan) every 3rd day for the next 2 weeks.   Follow up if not improving or back to baseline in 2 weeks.   Intertrigo Intertrigo is skin irritation (inflammation) that happens in warm, moist areas of the body. The irritation can cause a rash and make skin raw and itchy. The rash is usually pink or red. It happens mostly between folds of skin or where skin rubs together, such as: In the armpits. Under the breasts. Under the belly. In the groin area. Around the butt area. Between the toes. This condition is not passed from person to person. What are the causes? Heat, moisture, rubbing, and not enough air movement. The condition can be made worse by: Sweat. Bacteria. A fungus, such as yeast. What increases the risk? Moisture in your skin folds. You are more likely to develop this condition if you: Are not able to move around. Live in a warm and moist climate. Are not able to control your pee (urine) or poop (stool). Wear splints, braces, or other medical devices. Are overweight. Have diabetes. What are the signs or symptoms? A pink or red skin rash in a skin fold or near a skin fold. Raw or scaly skin. Itching. A burning feeling. Bleeding. Leaking fluid. A bad smell. How is this treated? Cleaning and drying your skin. Taking an antibiotic medicine or using an antibiotic skin cream for a bacterial infection. Using an antifungal cream on your skin or taking pills for an infection that was caused by a fungus, such as yeast. Using a steroid ointment to stop the itching and irritation. Separating the skin fold with a clean cotton cloth to absorb moisture and allow air to flow into the area. Follow these instructions at home: Keep the affected area clean and  dry. Do not scratch your skin. Stay cool as much as you can. Use an air conditioner or a fan, if you have one. Apply over-the-counter and prescription medicines only as told by your doctor. If you were prescribed antibiotics, use them as told by your doctor. Do not stop using the antibiotic even if you start to feel better. Keep all follow-up visits. Your doctor may need to check your skin to make sure that the treatment is working. How is this prevented? Shower and dry yourself well after being active. Use a hair dryer on a cool setting to dry between skin folds. Do not wear tight clothes. Wear clothes that: Are loose. Take moisture away from your body. Are made of cotton. Wear a bra that gives good support, if needed. Protect the skin in your groin and butt area as told by your doctor. To do this: Follow a regular cleaning routine. Use creams, powders, or ointments that protect your skin. Change protection pads often. Stay at a healthy weight. Take care of your feet. This is very important if you have diabetes. You should: Wear shoes that fit well. Keep your feet dry. Wear clean cotton or wool socks. Keep your blood sugar under control if you have diabetes. Contact a doctor if: Your symptoms do not get better with treatment. Your symptoms get worse or they spread. You notice more redness and warmth. You have a fever. This information is not intended to replace advice given to  you by your health care provider. Make sure you discuss any questions you have with your health care provider. Document Revised: 12/13/2021 Document Reviewed: 12/13/2021 Elsevier Patient Education  2023 [employer].

## 2022-12-17 NOTE — Assessment & Plan Note (Signed)
Reports increased use of CPAP

## 2022-12-23 ENCOUNTER — Other Ambulatory Visit: Payer: Self-pay | Admitting: Family Medicine

## 2022-12-23 ENCOUNTER — Other Ambulatory Visit: Payer: Self-pay | Admitting: Pulmonary Disease

## 2022-12-23 DIAGNOSIS — E119 Type 2 diabetes mellitus without complications: Secondary | ICD-10-CM

## 2022-12-25 ENCOUNTER — Other Ambulatory Visit: Payer: Self-pay | Admitting: Cardiovascular Disease

## 2022-12-25 NOTE — Telephone Encounter (Signed)
Rx request sent to pharmacy.  

## 2023-01-01 ENCOUNTER — Ambulatory Visit (AMBULATORY_SURGERY_CENTER): Payer: Medicare Other | Admitting: *Deleted

## 2023-01-01 VITALS — Ht 62.0 in | Wt 240.0 lb

## 2023-01-01 DIAGNOSIS — Z800 Family history of malignant neoplasm of digestive organs: Secondary | ICD-10-CM

## 2023-01-01 DIAGNOSIS — Z1211 Encounter for screening for malignant neoplasm of colon: Secondary | ICD-10-CM

## 2023-01-01 MED ORDER — NA SULFATE-K SULFATE-MG SULF 17.5-3.13-1.6 GM/177ML PO SOLN
1.0000 | Freq: Once | ORAL | 0 refills | Status: AC
Start: 2023-01-01 — End: 2023-01-01

## 2023-01-01 NOTE — Progress Notes (Signed)
Pt's name and DOB verified at the beginning of the pre-visit.  Pt denies any difficulty with ambulating,sitting, laying down or rolling side to side Gave both [facility] main # and MD on call # prior to instructions.  No egg or soy allergy known to patient  No issues known to pt with past sedation with any surgeries or procedures Pt denies having issues being intubated Pt has no issues moving head neck or swallowing No FH of Malignant Hyperthermia Pt is not on diet pills Pt is not on home 02  Pt is not on blood thinners  Pt denies issues with constipation  Pt is not on dialysis Pt denise any abnormal heart rhythms  Pt denies any upcoming cardiac testing Pt encouraged to use to use Singlecare or Goodrx to reduce cost  Patient's chart reviewed by Cathlyn Parsons CNRA prior to pre-visit and patient appropriate for the [facility].  Pre-visit completed and red dot placed by patient's name on their procedure day (on provider's schedule).  . Visit by phone Pt states weight is 240lb Instructed pt why it is important to and  to call if they have any changes in health or new medications. Directed them to the # given and on instructions.   Pt states they will.  Instructions reviewed with pt and pt states understanding. Instructed to review again prior to procedure. Pt states they will.  Instructions sent by mail with coupon and by my chart  Pt stated she has the prep at home reviewed what pt sees in box and she stated she sees 2 dark brown bottles and a 16 oz cup.

## 2023-01-09 ENCOUNTER — Ambulatory Visit (INDEPENDENT_AMBULATORY_CARE_PROVIDER_SITE_OTHER): Payer: Medicare Other | Admitting: Physician Assistant

## 2023-01-13 ENCOUNTER — Ambulatory Visit (INDEPENDENT_AMBULATORY_CARE_PROVIDER_SITE_OTHER): Payer: Medicare Other | Admitting: Physician Assistant

## 2023-01-13 ENCOUNTER — Encounter (INDEPENDENT_AMBULATORY_CARE_PROVIDER_SITE_OTHER): Payer: Self-pay | Admitting: Physician Assistant

## 2023-01-13 VITALS — BP 115/76 | HR 79 | Temp 98.3°F | Ht 62.0 in | Wt 237.0 lb

## 2023-01-13 DIAGNOSIS — E1169 Type 2 diabetes mellitus with other specified complication: Secondary | ICD-10-CM | POA: Diagnosis not present

## 2023-01-13 DIAGNOSIS — E669 Obesity, unspecified: Secondary | ICD-10-CM

## 2023-01-13 DIAGNOSIS — Z6841 Body Mass Index (BMI) 40.0 and over, adult: Secondary | ICD-10-CM

## 2023-01-13 DIAGNOSIS — E88819 Insulin resistance, unspecified: Secondary | ICD-10-CM

## 2023-01-13 DIAGNOSIS — Z7985 Long-term (current) use of injectable non-insulin antidiabetic drugs: Secondary | ICD-10-CM

## 2023-01-13 MED ORDER — TIRZEPATIDE 2.5 MG/0.5ML ~~LOC~~ SOAJ
2.5000 mg | SUBCUTANEOUS | 0 refills | Status: DC
Start: 2023-01-13 — End: 2023-02-11

## 2023-01-13 NOTE — Progress Notes (Signed)
[facility]  Office: [phone]  /  Fax: [phone]  WEIGHT SUMMARY AND BIOMETRICS  Vitals Temp: 98.3 F (36.8 C) BP: 115/76 Pulse Rate: 79 SpO2: 90 %   Anthropometric Measurements Height: 5\' 2"  (1.575 m) Weight: 237 lb (107.5 kg) BMI (Calculated): 43.34 Weight at Last Visit: 237 lb Weight Lost Since Last Visit: 0 lb Weight Gained Since Last Visit: 0 lb Starting Weight: 241 lb   Body Composition  Body Fat %: 49.3 % Fat Mass (lbs): 117.2 lbs Muscle Mass (lbs): 114.6 lbs Total Body Water (lbs): 81 lbs   Other Clinical Data Fasting: No Labs: No Today's Visit #: 3 Starting Date: 11/27/22     HPI  Chief Complaint: OBESITY  Leah Lawson is here to discuss her progress with her obesity treatment plan. She is on the the Category 3 Plan and states she is following her eating plan approximately 60 % of the time. She states she is exercising 0 minutes 0 times per week.   Interval History:  Since last office visit she has maintained her weight.  Reports recent fall with left wrist fracture and having difficulty with planning and prepping meals due to multiple stressors. Does not like her plan and is tired of trying to eat eggs and other things she does not normally eat. Reports mostly eating meat and potatoes prior to starting program. She does like beans and some vegetables and fruits.  She does have My fitness PAL on her phone and would like to try and log/journal her intake over the next few weeks.  Hunger/appetite-moderate control Cravings- increased  for sweets.  Stress- increased family stressors from multiple fronts.  Exercise-walking 10 minutes twice daily 2-3 times weekly    Pharmacotherapy: Farxiga for Type 2 diabetes. Reports no side effects with [place].   TREATMENT PLAN FOR OBESITY:  Recommended Dietary Goals  Leah Lawson is currently in the action stage of change. As such, her goal is to continue weight management plan. She has agreed to keeping a food journal  and adhering to recommended goals of 1400-1500 calories and 100 grams of  protein.daily. We discussed she can use a written log or My[employer] PAL if she would like and bring in to her next visit.   Behavioral Intervention  We discussed the following Behavioral Modification Strategies today: increasing lean protein intake, decreasing simple carbohydrates , increasing vegetables, increasing lower glycemic fruits, increasing fiber rich foods, avoiding skipping meals, increasing water intake, work on meal planning and preparation, continue to practice mindfulness when eating, and planning for success.  Additional resources provided today: Journaling and log information.        Smart fruit hand out       Protein content hand out  Recommended Physical Activity Goals  Leah Lawson has been advised to work up to 150 minutes of moderate intensity aerobic activity a week and strengthening exercises 2-3 times per week for cardiovascular health, weight loss maintenance and preservation of muscle mass.   She has agreed to Continue current level of physical activity  and Think about ways to increase daily physical activity and overcoming barriers to exercise   Pharmacotherapy We discussed various medication options to help Leah Lawson with her weight loss efforts and we both agreed to Start Mounjaro 2.5 mg weekly and continue Farxiga 5 mg once daily for Type 2 diabetes. .Patient denies a personal or family history of pancreatitis, medullary thyroid carcinoma or multiple endocrine neoplasia type II. Recommend reviewing pen training video online.     Return in about 4  weeks (around 02/10/2023).[name] She was informed of the importance of frequent follow up visits to maximize her success with intensive lifestyle modifications for her multiple health conditions.  PHYSICAL EXAM:  Blood pressure 115/76, pulse 79, temperature 98.3 F (36.8 C), height 5\' 2"  (1.575 m), weight 237 lb (107.5 kg), SpO2 90 %. Body mass index is  43.35 kg/m.  General: She is overweight, cooperative, alert, well developed, and in no acute distress. PSYCH: Has normal mood, affect and thought process.   Cardiovascular: HR 70's BP 115/76 Lungs: Normal breathing effort, no conversational dyspnea.  DIAGNOSTIC DATA REVIEWED:  BMET    Component Value Date/Time   NA 140 11/28/2022 1507   K 4.5 11/28/2022 1507   CL 100 11/28/2022 1507   CO2 24 11/28/2022 1507   GLUCOSE 172 (H) 11/28/2022 1507   GLUCOSE 96 09/04/2022 1332   BUN 20 11/28/2022 1507   CREATININE 1.13 (H) 11/28/2022 1507   CREATININE 0.87 09/04/2022 1332   CALCIUM 10.1 11/28/2022 1507   GFRNONAA >60 09/04/2022 1332   GFRAA >60 11/30/2019 0658   Lab Results  Component Value Date   HGBA1C 7.5 (H) 11/28/2022   HGBA1C 7.2 (H) 11/26/2019   Lab Results  Component Value Date   INSULIN 127.0 (H) 11/28/2022   Lab Results  Component Value Date   TSH 0.454 11/28/2022   CBC    Component Value Date/Time   WBC 6.8 11/28/2022 1507   WBC 7.0 09/04/2022 1332   WBC 5.8 08/14/2022 1051   RBC 5.50 (H) 11/28/2022 1507   RBC 5.35 (H) 09/04/2022 1332   HGB 16.9 (H) 11/28/2022 1507   HCT 50.4 (H) 11/28/2022 1507   PLT 332 11/28/2022 1507   MCV 92 11/28/2022 1507   MCH 30.7 11/28/2022 1507   MCH 31.4 09/04/2022 1332   MCHC 33.5 11/28/2022 1507   MCHC 34.6 09/04/2022 1332   RDW 12.7 11/28/2022 1507   Iron Studies    Component Value Date/Time   IRON 62 01/31/2022 1114   TIBC 350 01/31/2022 1114   FERRITIN 25 01/31/2022 1114   IRONPCTSAT 18 01/31/2022 1114   Lipid Panel     Component Value Date/Time   CHOL 179 11/28/2022 1507   TRIG 183 (H) 11/28/2022 1507   HDL 39 (L) 11/28/2022 1507   CHOLHDL 4.6 (H) 11/28/2022 1507   CHOLHDL 5 08/14/2022 1051   VLDL 23.0 08/14/2022 1051   LDLCALC 108 (H) 11/28/2022 1507   Hepatic Function Panel     Component Value Date/Time   PROT 7.1 11/28/2022 1507   ALBUMIN 4.3 11/28/2022 1507   AST 25 11/28/2022 1507   AST 12 (L)  09/04/2022 1332   ALT 25 11/28/2022 1507   ALT 11 09/04/2022 1332   ALKPHOS 96 11/28/2022 1507   BILITOT 0.5 11/28/2022 1507   BILITOT 0.6 09/04/2022 1332      Component Value Date/Time   TSH 0.454 11/28/2022 1507   Nutritional Lab Results  Component Value Date   VD25OH 35.1 11/28/2022   VD25OH 19.68 (L) 08/14/2022   VD25OH 45.35 01/31/2022    ASSOCIATED CONDITIONS ADDRESSED TODAY  ASSESSMENT AND PLAN  Problem List Items Addressed This Visit     Morbid obesity (HCC) with starting BMI 44   Relevant Medications   tirzepatide (MOUNJARO) 2.5 MG/0.5ML Pen   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 Last A1c was 7.5 On ARB  On statin  Medication(s):  Farxiga 5 mg daily. Reports no side effects.  We have reviewed the risks and benefits of using a GLP-1/GIP therapy. The patient denies a personal or family history of medullary thyroid cancer or MENII. The patient denies a history of pancreatitis. The potential risks and benefits of this GLP-1/GIP were reviewed with the patient, and alternative treatment options were discussed. All questions were answered, and the patient wishes to move forward with this medication.    Lab Results  Component Value Date   HGBA1C 7.5 (H) 11/28/2022   HGBA1C 7.7 (H) 08/14/2022   HGBA1C 7.3 03/13/2022   Lab Results  Component Value Date   MICROALBUR <0.7 08/14/2022   LDLCALC 108 (H) 11/28/2022   CREATININE 1.13 (H) 11/28/2022   Lab Results  Component Value Date   GFR 69.90 08/14/2022   GFR 81.38 01/31/2022   GFR 85.58 10/18/2021    Plan: Continue Farxiga.  We have reviewed the risks and benefits of using a GLP-1GIP. The  patient denies a personal or family history of medullary thyroid cancer or MENII. The patient denies a history of pancreatitis. The potential risks and benefits of this GLP-1/GIP were reviewed with the patient, and alternative treatment options were discussed. All questions were answered, and the patient wishes to move forward with this medication. Start Mounjaro 2.5 mg SQ weekly Insulin Resistance Last fasting insulin was 127. A1c was 7.5. Polyphagia:Yes Medication(s):  Farxiga 5 mg daily. No side effects.  Patient with severe insulin resistance and signs of metabolic syndrome. Started statin q M-W-F and has follow up labs planned in 2-3 mos with Dr. Chilton Si.  Lab Results  Component Value Date   HGBA1C 7.5 (H) 11/28/2022   HGBA1C 7.7 (H) 08/14/2022   HGBA1C 7.3 03/13/2022   HGBA1C 7.5 (H) 01/31/2022   HGBA1C 7.4 (H) 10/18/2021   Lab Results  Component Value Date   INSULIN 127.0 (H) 11/28/2022    Plan: See above.  Plan to Start Mounjaro 2.5 mg SQ weekly to address significant insulin resistance/metabolic syndrome. Monitor closely and titrate for response. Follow labs q 3-4 months .  ATTESTASTION STATEMENTS:  Reviewed by clinician on day of visit: allergies, medications, problem list, medical history, surgical history, family history, social history, and previous encounter notes.   I have personally spent 55 minutes total time today in preparation, patient care, nutritional counseling and documentation for this visit, including the following: review of clinical lab tests; review of medical tests/procedures/services.      Jessiah Steinhart, PA-C

## 2023-01-14 IMAGING — MG MM DIGITAL SCREENING BILAT W/ TOMO AND CAD
8 series · 8 of 24 positions shown · non-contrast
Comparison: Previous exam(s).

CLINICAL DATA: Screening.

EXAM:
DIGITAL SCREENING BILATERAL MAMMOGRAM WITH TOMOSYNTHESIS AND CAD
TECHNIQUE: Bilateral screening digital craniocaudal and mediolateral oblique
mammograms were obtained. Bilateral screening digital breast
tomosynthesis was performed. The images were evaluated with
computer-aided detection.

[R MLO synth-2D]
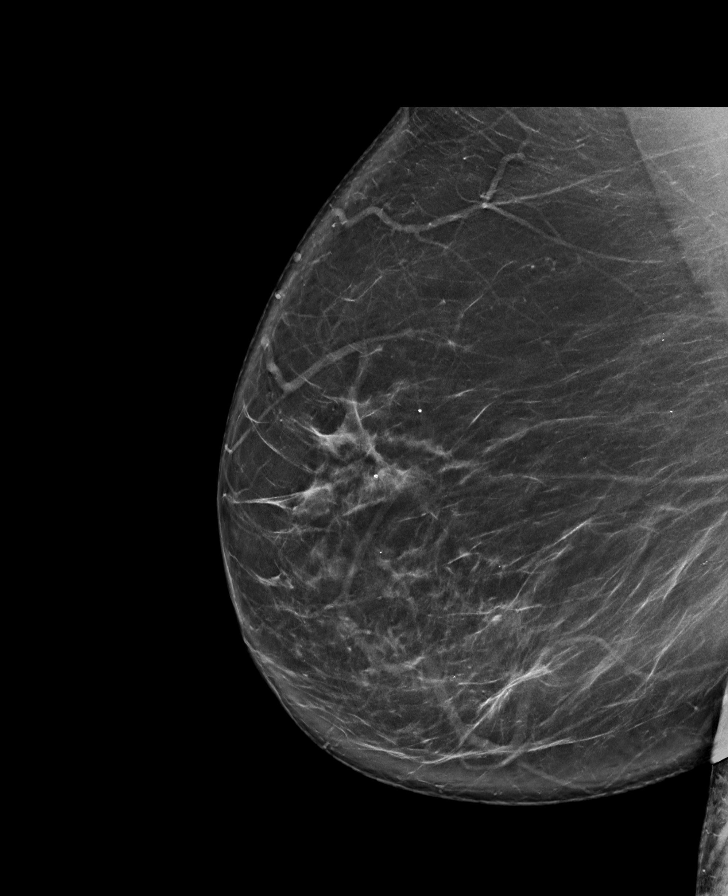

[L MLO synth-2D]
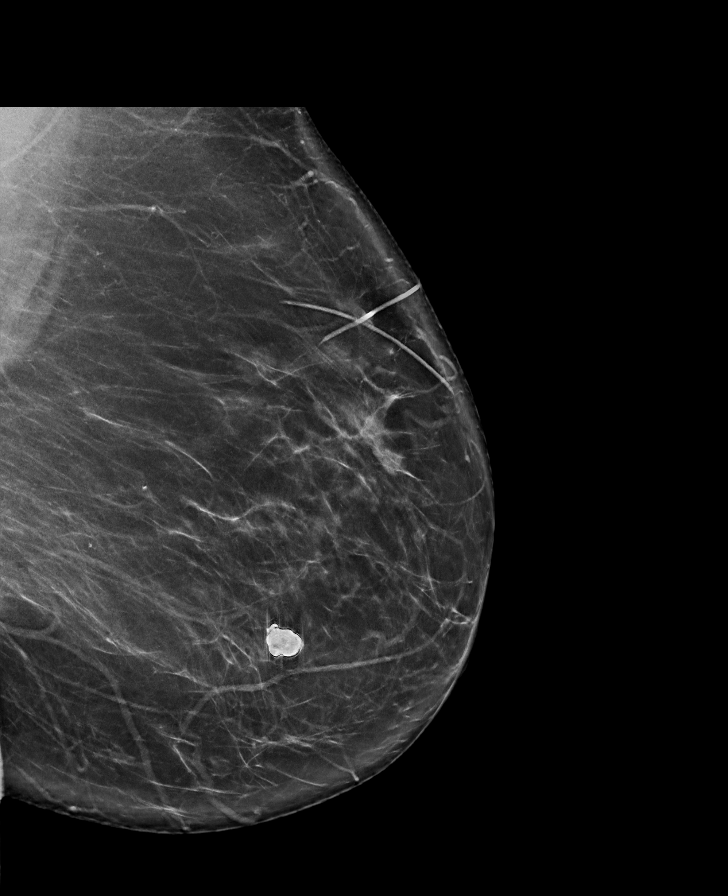

[L CC synth-2D]
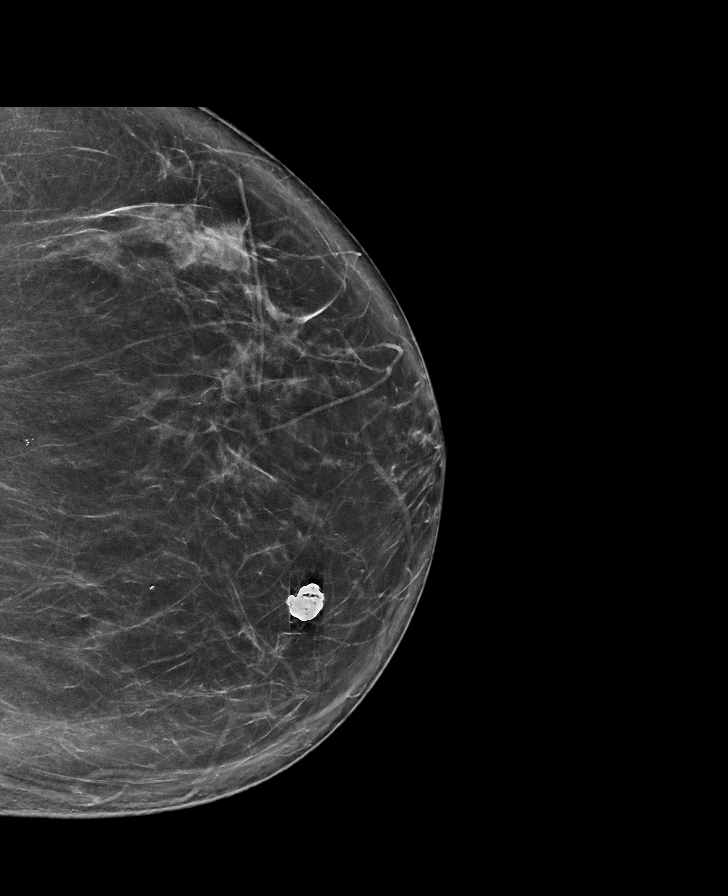

[R CC synth-2D]
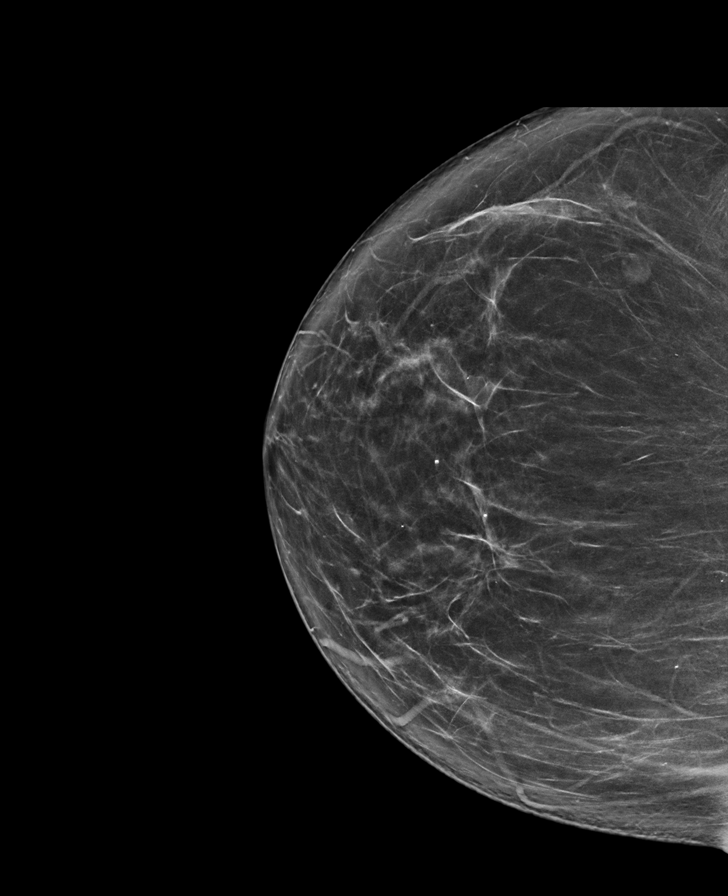

[R CC tomo · tomo slice 45/88.0]
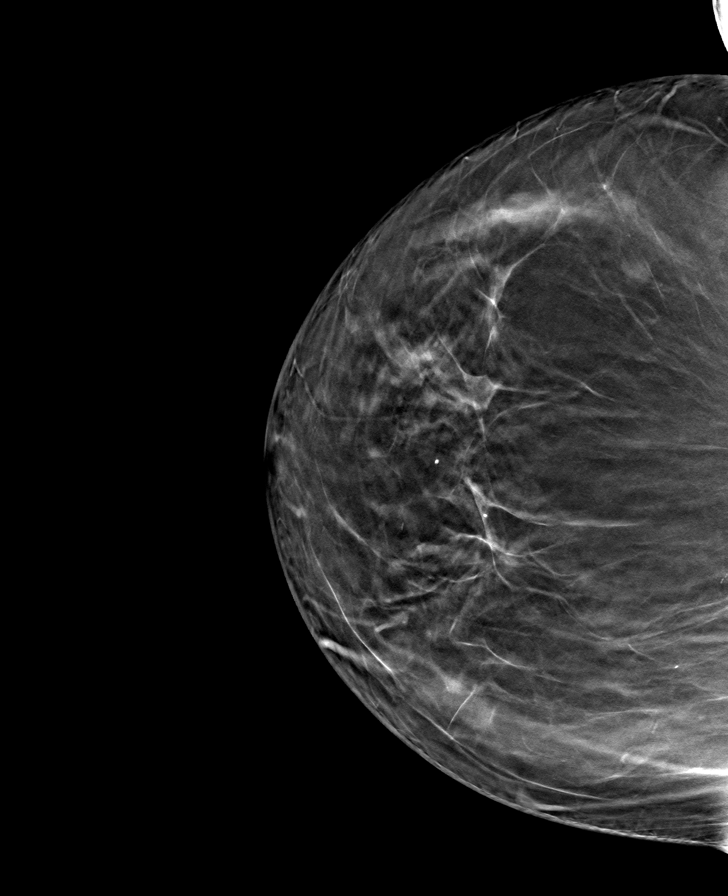

[L CC tomo · tomo slice 42/83.0]
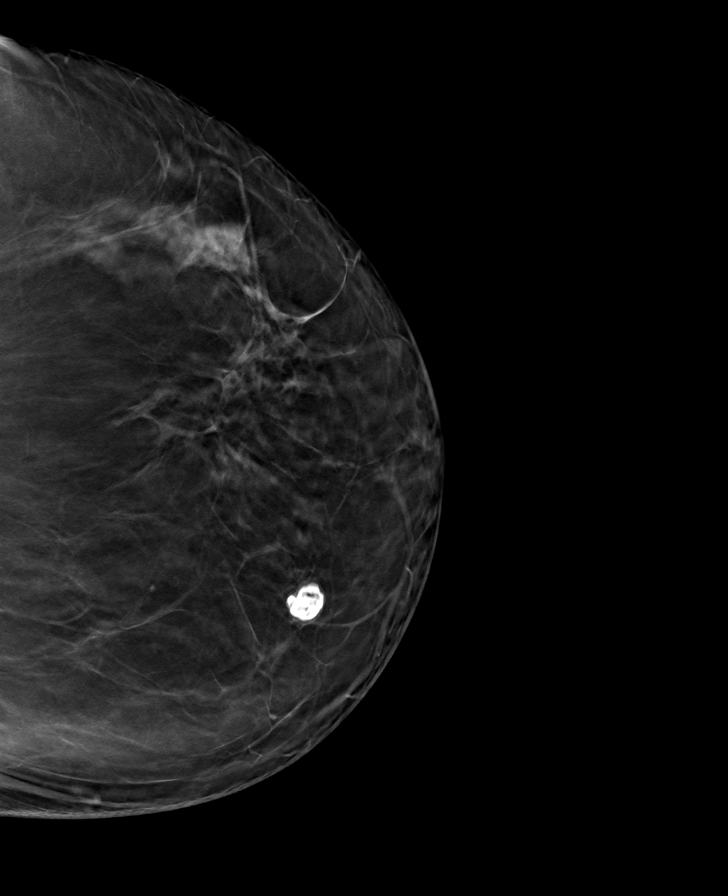

[R MLO tomo · tomo slice 50/99.0]
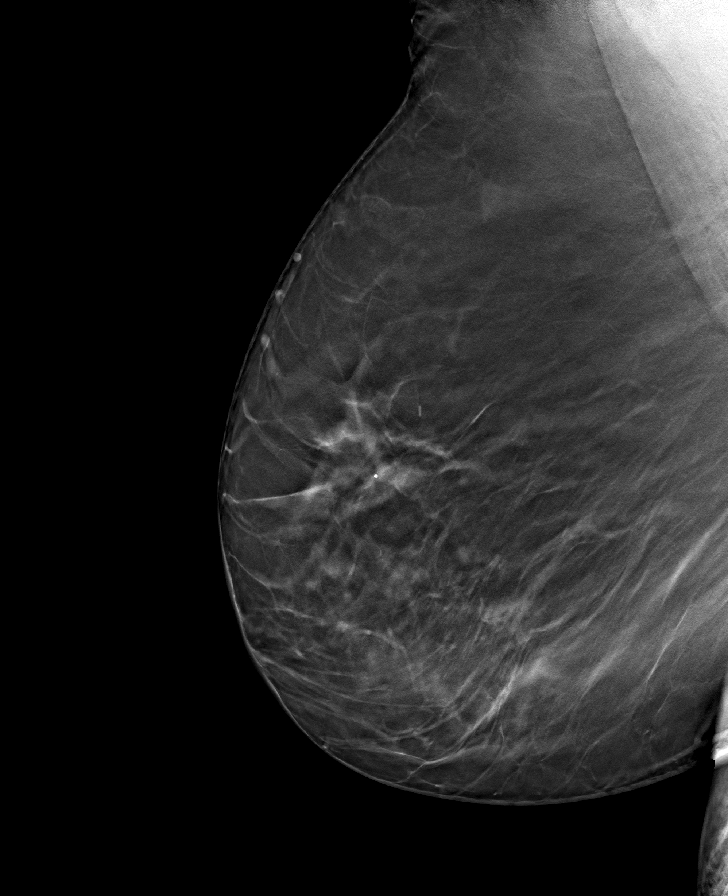

[L MLO tomo · tomo slice 48/95.0]
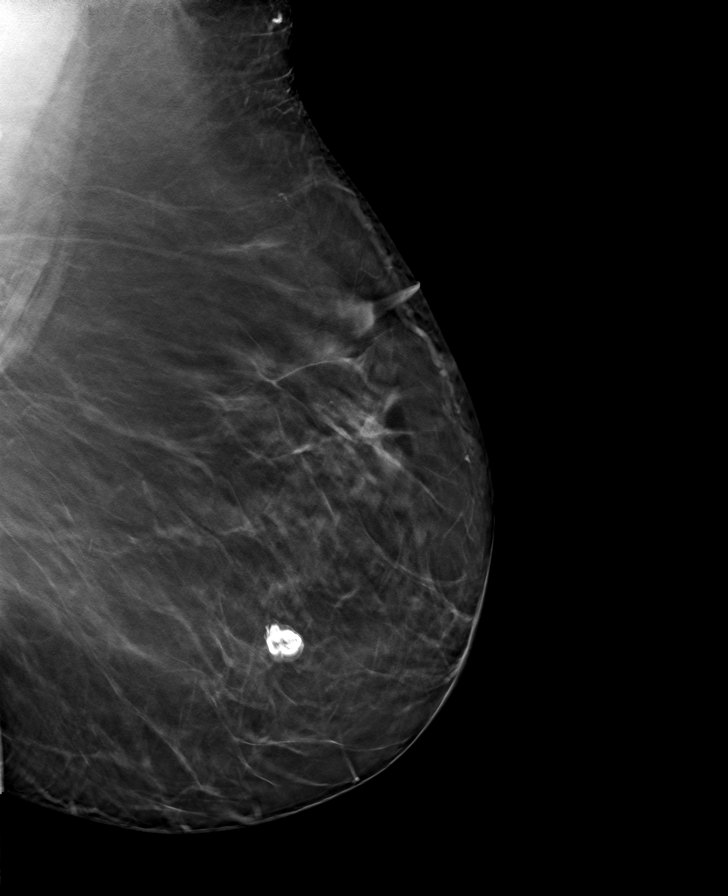

[8 of 24 positions shown; findings below may reference images not displayed]

ACR Breast Density Category b: There are scattered areas of
fibroglandular density.
FINDINGS: There are no findings suspicious for malignancy.
IMPRESSION: No mammographic evidence of malignancy. A result letter of this
screening mammogram will be mailed directly to the patient.

RECOMMENDATION:
Screening mammogram in one year. (Code:51-O-LD2)

BI-RADS CATEGORY  1: Negative.

## 2023-01-20 ENCOUNTER — Telehealth (INDEPENDENT_AMBULATORY_CARE_PROVIDER_SITE_OTHER): Payer: Self-pay | Admitting: Physician Assistant

## 2023-01-20 NOTE — Telephone Encounter (Signed)
Medicare [employer]let [phone] option 5) is calling because the Prior authorization for mounjaro was denied because they didn't receive proper clinical notes.  Erie Noe

## 2023-01-20 NOTE — Telephone Encounter (Signed)
I received a request for clinical information from [facility] today.  Shawn and I filled out information requested and I faxed it back with signature of provider.  Fax # [phone]

## 2023-01-21 NOTE — Telephone Encounter (Signed)
[employer] called and is wanting to know if the member diagnoses of type 2 diabetes mellitus and if they tried and failed an oral diabetes medication metformin or glipizide. Please send to Fax # [phone]

## 2023-01-22 NOTE — Telephone Encounter (Signed)
I called patient and left her a detailed message to find out how long she had taken Metformin for.  I informed on message that she can reach me via Mychart for better communication.

## 2023-01-22 NOTE — Telephone Encounter (Signed)
After speaking with patient, I called the number given below in message, ([employer]) and explained that I had sent everything in that was needed to start the PA in Cover my meds and also paper document that was faxed here for provider to fill out, and they still denied medication even after they faxed document over that was filled out and faxed back yesterday.  They informed me that it had been denied and I informed them that I wanted to appeal the decision via over the phone.  After being on hold for 32 minutes, I hung up and printed off patients office visit and refaxed the notes and documents that were faxed here.

## 2023-01-22 NOTE — Telephone Encounter (Signed)
Medicare [employer]und[employer] Millet [phone] option 5) is calling because the Prior authorization for mounjaro was denied because they didn't receive proper clinical notes. JE

## 2023-01-24 ENCOUNTER — Other Ambulatory Visit (HOSPITAL_BASED_OUTPATIENT_CLINIC_OR_DEPARTMENT_OTHER): Payer: Self-pay | Admitting: Cardiovascular Disease

## 2023-01-27 NOTE — Telephone Encounter (Signed)
Rx(s) sent to pharmacy electronically.  

## 2023-01-28 ENCOUNTER — Encounter: Payer: Self-pay | Admitting: Gastroenterology

## 2023-01-28 ENCOUNTER — Ambulatory Visit (AMBULATORY_SURGERY_CENTER): Payer: Medicare Other | Admitting: Gastroenterology

## 2023-01-28 VITALS — BP 132/84 | HR 67 | Temp 98.7°F | Resp 20 | Ht 62.0 in | Wt 240.0 lb

## 2023-01-28 DIAGNOSIS — Z1211 Encounter for screening for malignant neoplasm of colon: Secondary | ICD-10-CM

## 2023-01-28 DIAGNOSIS — Z800 Family history of malignant neoplasm of digestive organs: Secondary | ICD-10-CM

## 2023-01-28 DIAGNOSIS — D123 Benign neoplasm of transverse colon: Secondary | ICD-10-CM | POA: Diagnosis not present

## 2023-01-28 DIAGNOSIS — D124 Benign neoplasm of descending colon: Secondary | ICD-10-CM | POA: Diagnosis not present

## 2023-01-28 DIAGNOSIS — D122 Benign neoplasm of ascending colon: Secondary | ICD-10-CM | POA: Diagnosis not present

## 2023-01-28 DIAGNOSIS — K635 Polyp of colon: Secondary | ICD-10-CM | POA: Diagnosis not present

## 2023-01-28 DIAGNOSIS — D128 Benign neoplasm of rectum: Secondary | ICD-10-CM

## 2023-01-28 MED ORDER — SODIUM CHLORIDE 0.9 % IV SOLN
500.0000 mL | Freq: Once | INTRAVENOUS | Status: DC
Start: 2023-01-28 — End: 2023-01-28

## 2023-01-28 NOTE — Progress Notes (Signed)
To pacu VSS. Report to Rn.tb 

## 2023-01-28 NOTE — Patient Instructions (Addendum)
Await pathology results  Resume previous diet.  Continue present medications.  Handouts on polyps and diverticulosis provided.  YOU HAD AN ENDOSCOPIC PROCEDURE TODAY AT THE [facility]:   Refer to the procedure report that was given to you for any specific questions about what was found during the examination.  If the procedure report does not answer your questions, please call your gastroenterologist to clarify.  If you requested that your care partner not be given the details of your procedure findings, then the procedure report has been included in a sealed envelope for you to review at your convenience later.  YOU SHOULD EXPECT: Some feelings of bloating in the abdomen. Passage of more gas than usual.  Walking can help get rid of the air that was put into your GI tract during the procedure and reduce the bloating. If you had a lower endoscopy (such as a colonoscopy or flexible sigmoidoscopy) you may notice spotting of blood in your stool or on the toilet paper. If you underwent a bowel prep for your procedure, you may not have a normal bowel movement for a few days.  Please Note:  You might notice some irritation and congestion in your nose or some drainage.  This is from the oxygen used during your procedure.  There is no need for concern and it should clear up in a day or so.  SYMPTOMS TO REPORT IMMEDIATELY:  Following lower endoscopy (colonoscopy or flexible sigmoidoscopy):  Excessive amounts of blood in the stool  Significant tenderness or worsening of abdominal pains  Swelling of the abdomen that is new, acute  Fever of 100F or higher  For urgent or emergent issues, a gastroenterologist can be reached at any hour by calling [phone]. Do not use MyChart messaging for urgent concerns.    DIET:  We do recommend a small meal at first, but then you may proceed to your regular diet.  Drink plenty of fluids but you should avoid alcoholic beverages for 24  hours.  ACTIVITY:  You should plan to take it easy for the rest of today and you should NOT DRIVE or use heavy machinery until tomorrow (because of the sedation medicines used during the test).    FOLLOW UP: Our staff will call the number listed on your records the next business day following your procedure.  We will call around 7:15- 8:00 am to check on you and address any questions or concerns that you may have regarding the information given to you following your procedure. If we do not reach you, we will leave a message.     If any biopsies were taken you will be contacted by phone or by letter within the next 1-3 weeks.  Please call us at [phone] if you have not heard about the biopsies in 3 weeks.    SIGNATURES/CONFIDENTIALITY: You and/or your care partner have signed paperwork which will be entered into your electronic medical record.  These signatures attest to the fact that that the information above on your After Visit Summary has been reviewed and is understood.  Full responsibility of the confidentiality of this discharge information lies with you and/or your care-partner.  

## 2023-01-28 NOTE — Progress Notes (Signed)
VS by CW  Pt's states no medical or surgical changes since previsit or office visit.  

## 2023-01-28 NOTE — Progress Notes (Signed)
Called to room to assist during endoscopic procedure.  Patient ID and intended procedure confirmed with present staff. Received instructions for my participation in the procedure from the performing physician.  

## 2023-01-28 NOTE — Progress Notes (Signed)
[facility] History and Physical   Primary Care Physician:  Avanell Shackleton, NP-C   Reason for Procedure:   Colon cancer screening/family history of colon cancer  Plan:    Screening colonoscopy     HPI: Leah Lawson is a 67 y.o. female undergoing initial screening colonoscopy.  Her father was diagnosed with colon cancer in his 83s.  She has occasional constipation, but otherwise no chronic lower GI symptoms.  She has done stool based screening in the past, but this is her first colonoscopy.   Past Medical History:  Diagnosis Date   Arthritis    Asthma    Back pain    Cataract    Chronic kidney disease    Constipation    COPD (chronic obstructive pulmonary disease) (HCC)    COPD (chronic obstructive pulmonary disease) (HCC)    Diabetes (HCC)    Diabetes mellitus without complication (HCC)    "pre-diabetic"   Emphysema of lung (HCC)    Glaucoma    Heartburn    High cholesterol    Hypertension    Hypertensive retinopathy 10/26/2021   Lactose intolerance    Open-angle glaucoma 10/26/2021   Other specified disorders of thyroid    Sleep apnea    SOB (shortness of breath)    Vitamin D deficiency     Past Surgical History:  Procedure Laterality Date   BREAST SURGERY      Prior to Admission medications   Medication Sig Start Date End Date Taking? Authorizing Provider  albuterol (VENTOLIN HFA) 108 (90 Base) MCG/ACT inhaler INHALE 2 PUFFS BY MOUTH EVERY 6 HOURS AS NEEDED FOR WHEEZING AND FOR SHORTNESS OF BREATH 12/24/22   Oretha Milch, MD  clotrimazole-betamethasone (LOTRISONE) cream Apply 1 Application topically daily. 12/17/22   Henson, Vickie L, NP-C  dapagliflozin propanediol (FARXIGA) 5 MG TABS tablet TAKE 1 TABLET BY MOUTH ONCE DAILY BEFORE BREAKFAST 12/23/22   Henson, Vickie L, NP-C  fluconazole (DIFLUCAN) 150 MG tablet Take 1 tablet (150 mg) every 3rd day x 2 weeks. 12/17/22   Avanell Shackleton, NP-C  losartan-hydrochlorothiazide (HYZAAR) 100-25 MG  tablet Take 1 tablet by mouth once daily 12/25/22   Chilton Si, MD  omeprazole (PRILOSEC) 20 MG capsule Take 1 capsule (20 mg total) by mouth daily. 10/24/22   Jenel Lucks, MD  oxyCODONE-acetaminophen (PERCOCET) 10-325 MG tablet Take 1 tablet by mouth as needed for pain. 11/28/22   [provider]  rosuvastatin (CRESTOR) 10 MG tablet TAKE 1 TABLET MONDAY, WEDNESDAY, AND FRIDAY ONLY 12/04/22   Chilton Si, MD  spironolactone (ALDACTONE) 25 MG tablet Take 1 tablet by mouth once daily 01/27/23   Chilton Si, MD  tirzepatide [facility]) 2.5 MG/0.5ML Pen Inject 2.5 mg into the skin once a week. 01/13/23   Rayburn, Fanny Bien, PA-C  triamcinolone cream (KENALOG) 0.1 % Apply 1 Application topically 2 (two) times daily. 01/31/22   Henson, Vickie L, NP-C  umeclidinium-vilanterol (ANORO ELLIPTA) 62.5-25 MCG/ACT AEPB Inhale 1 puff into the lungs daily. 08/15/22   Parrett, Virgel Bouquet, NP    Current Outpatient Medications  Medication Sig Dispense Refill   albuterol (VENTOLIN HFA) 108 (90 Base) MCG/ACT inhaler INHALE 2 PUFFS BY MOUTH EVERY 6 HOURS AS NEEDED FOR WHEEZING AND FOR SHORTNESS OF BREATH 9 g 3   clotrimazole-betamethasone (LOTRISONE) cream Apply 1 Application topically daily. 30 g 0   dapagliflozin propanediol (FARXIGA) 5 MG TABS tablet TAKE 1 TABLET BY MOUTH ONCE DAILY BEFORE BREAKFAST 90 tablet 0   fluconazole (  DIFLUCAN) 150 MG tablet Take 1 tablet (150 mg) every 3rd day x 2 weeks. 6 tablet 0   losartan-hydrochlorothiazide (HYZAAR) 100-25 MG tablet Take 1 tablet by mouth once daily 90 tablet 1   omeprazole (PRILOSEC) 20 MG capsule Take 1 capsule (20 mg total) by mouth daily. 90 capsule 3   oxyCODONE-acetaminophen (PERCOCET) 10-325 MG tablet Take 1 tablet by mouth as needed for pain.     rosuvastatin (CRESTOR) 10 MG tablet TAKE 1 TABLET MONDAY, WEDNESDAY, AND FRIDAY ONLY 40 tablet 3   spironolactone (ALDACTONE) 25 MG tablet Take 1 tablet by mouth once daily 90 tablet 2    tirzepatide (MOUNJARO) 2.5 MG/0.5ML Pen Inject 2.5 mg into the skin once a week. 2 mL 0   triamcinolone cream (KENALOG) 0.1 % Apply 1 Application topically 2 (two) times daily. 30 g 0   umeclidinium-vilanterol (ANORO ELLIPTA) 62.5-25 MCG/ACT AEPB Inhale 1 puff into the lungs daily. 60 each 5   Current Facility-Administered Medications  Medication Dose Route Frequency Provider Last Rate Last Admin   0.9 %  sodium chloride infusion  500 mL Intravenous Once Jenel Lucks, MD        Allergies as of 01/28/2023 - Review Complete 01/13/2023  Allergen Reaction Noted   Hydralazine Swelling 02/11/2022   Lisinopril Cough 10/26/2021   Singulair [montelukast sodium] Other (See Comments) 05/08/2018   Mucinex [guaifenesin er] Diarrhea and Rash 05/08/2018    Family History  Problem Relation Age of Onset   Heart failure Mother    Arthritis Mother    Diabetes Mother    High blood pressure Mother    Colon polyps Father    Colon cancer Father    Cancer Father        prostate, lung   COPD Father    High Cholesterol Father    High blood pressure Father    Dementia Father    Diabetes Brother    High blood pressure Brother    Stroke Brother    Cancer Maternal Grandfather    Miscarriages / Stillbirths Daughter    Breast cancer Cousin    Esophageal cancer Neg Hx    Liver disease Neg Hx    Rectal cancer Neg Hx    Stomach cancer Neg Hx     Social History   Socioeconomic History   Marital status: Single    Spouse name: Not on file   Number of children: 4   Years of education: Not on file   Highest education level: Master's degree (e.g., MA, MS, MEng, MEd, MSW, MBA)  Occupational History   Occupation: retired  Tobacco Use   Smoking status: Former    Packs/day: 0.75    Years: 46.00    Additional pack years: 0.00    Total pack years: 34.50    Types: Cigarettes    Quit date: 05/05/2018    Years since quitting: 4.7    Passive exposure: Past   Smokeless tobacco: Never  Vaping Use    Vaping Use: Never used  Substance and Sexual Activity   Alcohol use: Not Currently   Drug use: Never   Sexual activity: Not on file  Other Topics Concern   Not on file  Social History Narrative   Not on file   Social Determinants of Health   Financial Resource Strain: Low Risk  (12/16/2022)   Overall Financial Resource Strain (CARDIA)    Difficulty of Paying Living Expenses: Not hard at all  Food Insecurity: No Food Insecurity (12/16/2022)  Hunger Vital Sign    Worried About Running Out of Food in the Last Year: Never true    Ran Out of Food in the Last Year: Never true  Transportation Needs: No Transportation Needs (12/16/2022)   PRAPARE - Administrator, Civil Service (Medical): No    Lack of Transportation (Non-Medical): No  Physical Activity: Insufficiently Active (12/16/2022)   Exercise Vital Sign    Days of Exercise per Week: 4 days    Minutes of Exercise per Session: 20 min  Stress: No Stress Concern Present (12/16/2022)   [employer] of Occupational Health - Occupational Stress Questionnaire    Feeling of Stress : Not at all  Social Connections: Moderately Integrated (12/16/2022)   Social Connection and Isolation Panel [NHANES]    Frequency of Communication with Friends and Family: More than three times a week    Frequency of Social Gatherings with Friends and Family: Twice a week    Attends Religious Services: More than 4 times per year    Active Member of [employer] or Organizations: Yes    Attends Engineer, structural: More than 4 times per year    Marital Status: Never married  Intimate Partner Violence: Not on file    Review of Systems:  All other review of systems negative except as mentioned in the HPI.  Physical Exam: Vital signs BP 139/89   Pulse 73   Temp 98.7 F (37.1 C) (Skin)   Ht 5\' 2"  (1.575 m)   Wt 240 lb (108.9 kg)   SpO2 93%   BMI 43.90 kg/m   General:   Alert,  Well-developed, well-nourished, pleasant and  cooperative in NAD Airway:  Mallampati 3 Lungs:  Clear throughout to auscultation.   Heart:  Regular rate and rhythm; no murmurs, clicks, rubs,  or gallops. Abdomen:  Soft, nontender and nondistended. Normal bowel sounds.   Neuro/Psych:  Normal mood and affect. A and O x 3   Koryn Charlot E. Tomasa Rand, MD [facility]

## 2023-01-29 ENCOUNTER — Telehealth: Payer: Self-pay | Admitting: Gastroenterology

## 2023-01-29 ENCOUNTER — Telehealth: Payer: Self-pay

## 2023-01-29 NOTE — Telephone Encounter (Signed)
Attempted f/u call. No answer, left VM. 

## 2023-01-29 NOTE — Telephone Encounter (Signed)
Was able to speak with pt. States last night after eating orange chicken and fried rice her stomach was hurting and felt tight. Reports that the discomfort has improved today and that her stomach is soft again and she is feeling better. She thinks that maybe she ate too much, but she wanted to check in with [place]. Instructed pt that she can try light ambulation and/or GasX if needed for any discomfort as she may have residual gas from the procedure. Pt verbalized understanding and has no other concerns at this time.

## 2023-01-29 NOTE — Telephone Encounter (Signed)
Attempted to return pt's call. No answer. Left VM stating we had called and will try back in a few minutes.

## 2023-01-29 NOTE — Telephone Encounter (Signed)
Inbound call from patient stating she missed follow up call this morning. Requesting another follow up call. Please advise, thank you.

## 2023-01-29 NOTE — Op Note (Signed)
[facility] Patient Name: Leah Lawson Procedure Date: 01/28/2023 10:11 AM MRN: [id number] Endoscopist: Lorin Picket E. Tomasa Rand , MD, [id number] Age: 67 Referring MD:  Date of Birth: Oct 30, 1955 Gender: Female Account #: [id number] Procedure:                Colonoscopy Indications:              Screening in patient at increased risk: Colorectal                            cancer in father 29 or older Medicines:                Monitored Anesthesia Care Procedure:                Pre-Anesthesia Assessment:                           - Prior to the procedure, a History and Physical                            was performed, and patient medications and                            allergies were reviewed. The patient's tolerance of                            previous anesthesia was also reviewed. The risks                            and benefits of the procedure and the sedation                            options and risks were discussed with the patient.                            All questions were answered, and informed consent                            was obtained. Prior Anticoagulants: The patient has                            taken no anticoagulant or antiplatelet agents. ASA                            Grade Assessment: III - A patient with severe                            systemic disease. After reviewing the risks and                            benefits, the patient was deemed in satisfactory                            condition to undergo the procedure.  After obtaining informed consent, the colonoscope                            was passed under direct vision. Throughout the                            procedure, the patient's blood pressure, pulse, and                            oxygen saturations were monitored continuously. The                            Olympus CF-HQ190L [id number]) Colonoscope was                            introduced through the  anus and advanced to the the                            cecum, identified by appendiceal orifice and                            ileocecal valve. The colonoscopy was somewhat                            difficult due to significant looping. Successful                            completion of the procedure was aided by using                            manual pressure. The patient tolerated the                            procedure well. The quality of the bowel                            preparation was adequate. The ileocecal valve,                            appendiceal orifice, and rectum were photographed.                            The bowel preparation used was SUPREP via split                            dose instruction. Findings:                 The perianal and digital rectal examinations were                            normal. Pertinent negatives include normal                            sphincter tone and no palpable rectal lesions.  A 6 mm polyp was found in the ascending colon. The                            polyp was sessile. The polyp was removed with a                            cold snare. Resection and retrieval were complete.                            Estimated blood loss was minimal.                           Seven sessile polyps were found in the transverse                            colon. The polyps were 3 to 7 mm in size. These                            polyps were removed with a cold snare. Resection                            and retrieval were complete. Estimated blood loss                            was minimal.                           Three sessile polyps were found in the splenic                            flexure. The polyps were 3 to 5 mm in size. These                            polyps were removed with a cold snare. Resection                            and retrieval were complete. Estimated blood loss                            was  minimal.                           Two sessile polyps were found in the descending                            colon. The polyps were 5 to 6 mm in size. These                            polyps were removed with a cold snare. Resection                            and retrieval were complete. Estimated blood loss  was minimal.                           A 4 mm polyp was found in the rectum. The polyp was                            sessile. The polyp was removed with a cold snare.                            Resection and retrieval were complete. Estimated                            blood loss was minimal.                           Multiple large-mouthed and small-mouthed                            diverticula were found in the descending colon.                           The exam was otherwise normal throughout the                            examined colon.                           The retroflexed view of the distal rectum and anal                            verge was normal and showed no anal or rectal                            abnormalities. Complications:            No immediate complications. Estimated Blood Loss:     Estimated blood loss was minimal. Impression:               - One 6 mm polyp in the ascending colon, removed                            with a cold snare. Resected and retrieved.                           - Seven 3 to 7 mm polyps in the transverse colon,                            removed with a cold snare. Resected and retrieved.                           - Three 3 to 5 mm polyps at the splenic flexure,                            removed with a cold snare. Resected and retrieved.                           -  Two 5 to 6 mm polyps in the descending colon,                            removed with a cold snare. Resected and retrieved.                           - One 4 mm polyp in the rectum, removed with a cold                            snare. Resected  and retrieved.                           - Moderate diverticulosis in the descending colon.                           - The distal rectum and anal verge are normal on                            retroflexion view. Recommendation:           - Patient has a contact number available for                            emergencies. The signs and symptoms of potential                            delayed complications were discussed with the                            patient. Return to normal activities tomorrow.                            Written discharge instructions were provided to the                            patient.                           - Resume previous diet.                           - Continue present medications.                           - Await pathology results.                           - Repeat colonoscopy (date not yet determined) for                            surveillance based on pathology results.                           ******************************DUE TO SYSTEM WIDE  INTERNET OUTAGE WHICH OCCURRED DURING THIS                            PROCEDURE, ONLY THE FIRST FEW PHOTOGRAPHS WERE ABLE                            TO BE RECORDED*************** Cloris Flippo E. Tomasa Rand, MD 01/29/2023 12:26:13 PM This report has been signed electronically.

## 2023-02-04 NOTE — Progress Notes (Signed)
  All of the polyps that I removed during your recent procedure except for one were completely benign but were proven to be "pre-cancerous" polyps that MAY have grown into cancers if they had not been removed.  One polyp was not precancerous.  Studies shows that at least 20% of women over age 68 and 30% of men over age 69 have pre-cancerous polyps.  Based on current nationally recognized surveillance guidelines, I recommend that you have a repeat colonoscopy in 1 year.   If you develop any new rectal bleeding, abdominal pain or significant bowel habit changes, please contact me before then.

## 2023-02-11 ENCOUNTER — Encounter (INDEPENDENT_AMBULATORY_CARE_PROVIDER_SITE_OTHER): Payer: Self-pay | Admitting: Physician Assistant

## 2023-02-11 ENCOUNTER — Ambulatory Visit (INDEPENDENT_AMBULATORY_CARE_PROVIDER_SITE_OTHER): Payer: Medicare Other | Admitting: Physician Assistant

## 2023-02-11 VITALS — BP 97/65 | HR 93 | Temp 98.4°F | Ht 62.0 in | Wt 240.0 lb

## 2023-02-11 DIAGNOSIS — Z7985 Long-term (current) use of injectable non-insulin antidiabetic drugs: Secondary | ICD-10-CM

## 2023-02-11 DIAGNOSIS — E785 Hyperlipidemia, unspecified: Secondary | ICD-10-CM

## 2023-02-11 DIAGNOSIS — Z6841 Body Mass Index (BMI) 40.0 and over, adult: Secondary | ICD-10-CM

## 2023-02-11 DIAGNOSIS — N1831 Chronic kidney disease, stage 3a: Secondary | ICD-10-CM | POA: Diagnosis not present

## 2023-02-11 DIAGNOSIS — E559 Vitamin D deficiency, unspecified: Secondary | ICD-10-CM

## 2023-02-11 DIAGNOSIS — E1169 Type 2 diabetes mellitus with other specified complication: Secondary | ICD-10-CM | POA: Diagnosis not present

## 2023-02-11 DIAGNOSIS — E669 Obesity, unspecified: Secondary | ICD-10-CM

## 2023-02-11 MED ORDER — TIRZEPATIDE 5 MG/0.5ML ~~LOC~~ SOAJ
5.0000 mg | SUBCUTANEOUS | 0 refills | Status: DC
Start: 2023-02-11 — End: 2023-03-12

## 2023-02-11 NOTE — Progress Notes (Signed)
.smr  Office: [phone]  /  Fax: [phone]  WEIGHT SUMMARY AND BIOMETRICS   Vitals Temp: 98.3 F (36.8 C) BP: 97/65 Pulse Rate: 93 SpO2: 91 %     Anthropometric Measurements Height: 5\' 2"  (1.575 m) Weight: 240 lb (108.9 kg) BMI (Calculated): 43.89 Weight at Last Visit: 237 lb Weight Lost Since Last Visit: 0 lb Weight Gained Since Last Visit: 3 lb Starting Weight: 241 lb     Body Composition  Body Fat %: 45.5 % Fat Mass (lbs): 109.2 lbs Muscle Mass (lbs): 124.2 lbs Total Body Water (lbs): 83 lbs     Other Clinical Data Fasting: No Labs: No Today's Visit #: 4 Starting Date: 11/27/22     HPI  Chief Complaint: OBESITY  Leah Lawson is here to discuss her progress with her obesity treatment plan. She is on the the Category 3 Plan and states she is following her eating plan approximately 40 % of the time. She states she is exercising 0 minutes 0 times per week.   Interval History:  Since last office visit she is up 3 lbs, but muscle mass has increased and adipose mass has decreased on bio impedence scale.   Struggling with journaling and discussed trying to journal at least 3-4 days weekly to increase adherence. Supplementing with SLATE protein shakes.   Had colonoscopy- showed numerous precancerous polyps- Recheck in 1 year  Struggling with pain issues and stopped taking statin, Crestor due to increased aches and pains she felt were new with taking statin.  Has follow up Chest CT scheduled for 04/28/23 to follow up a nodule noted on last CT done several months ago.   Hunger/appetite-increased hunger lately Cravings- increased lately Sleep- OSA using CPAP, but does not like it  Stress- increased with continue pain from left wrist and hand Exercise-30 minutes 5 times weekly  Sees Dr. Duke Salvia soon and reports labs to be done at that time.  Pharmacotherapy: Mounjaro 2.5 mg weekly for Type 2 diabetes. No side effects with Mounjaro. Denies mass in neck,  dysphagia, dyspepsia, persistent hoarseness, abdominal pain, or N/V/Constipation or diarrhea. Has annual eye exam. Mood is stable. BM q 2 days.    TREATMENT PLAN FOR OBESITY:  Recommended Dietary Goals  Leah Lawson is currently in the action stage of change. As such, her goal is to continue weight management plan. She has agreed to the Category 3 Plan. She can journal again at least 3-4 days weekly if she is interested in journaling again over the next month.   Behavioral Intervention  We discussed the following Behavioral Modification Strategies today: increasing lean protein intake, decreasing simple carbohydrates , increasing vegetables, increasing lower glycemic fruits, avoiding skipping meals, increasing water intake, continue to practice mindfulness when eating, and planning for success.  Additional resources provided today: NA  Recommended Physical Activity Goals  Leah Lawson has been advised to work up to 150 minutes of moderate intensity aerobic activity a week and strengthening exercises 2-3 times per week for cardiovascular health, weight loss maintenance and preservation of muscle mass.   She has agreed to Think about ways to increase daily physical activity and overcoming barriers to exercise   Pharmacotherapy We discussed various medication options to help Leah Lawson with her weight loss efforts and we both agreed to increase Mounjaro to 5 mg weekly.    Return in about 3 weeks (around 03/04/2023).[name] She was informed of the importance of frequent follow up visits to maximize her success with intensive lifestyle modifications for her multiple health conditions.  PHYSICAL EXAM:  Blood pressure 97/65, pulse 93, temperature 98.4 F (36.9 C), height 5\' 2"  (1.575 m), weight 240 lb (108.9 kg), SpO2 91%. Body mass index is 43.9 kg/m.  General: She is overweight, cooperative, alert, well developed, and in no acute distress. PSYCH: Has normal mood, affect and thought process.    Cardiovascular: HR 90's, BP 97/65 BP running low today. Reports no symptoms . No chest pain, no palpitations, no light headedness or dizziness.  Lungs: Normal breathing effort, no conversational dyspnea. Neuro: no focal deficits  DIAGNOSTIC DATA REVIEWED:  BMET    Component Value Date/Time   NA 140 11/28/2022 1507   K 4.5 11/28/2022 1507   CL 100 11/28/2022 1507   CO2 24 11/28/2022 1507   GLUCOSE 172 (H) 11/28/2022 1507   GLUCOSE 96 09/04/2022 1332   BUN 20 11/28/2022 1507   CREATININE 1.13 (H) 11/28/2022 1507   CREATININE 0.87 09/04/2022 1332   CALCIUM 10.1 11/28/2022 1507   GFRNONAA >60 09/04/2022 1332   GFRAA >60 11/30/2019 0658   Lab Results  Component Value Date   HGBA1C 7.5 (H) 11/28/2022   HGBA1C 7.2 (H) 11/26/2019   Lab Results  Component Value Date   INSULIN 127.0 (H) 11/28/2022   Lab Results  Component Value Date   TSH 0.454 11/28/2022   CBC    Component Value Date/Time   WBC 6.8 11/28/2022 1507   WBC 7.0 09/04/2022 1332   WBC 5.8 08/14/2022 1051   RBC 5.50 (H) 11/28/2022 1507   RBC 5.35 (H) 09/04/2022 1332   HGB 16.9 (H) 11/28/2022 1507   HCT 50.4 (H) 11/28/2022 1507   PLT 332 11/28/2022 1507   MCV 92 11/28/2022 1507   MCH 30.7 11/28/2022 1507   MCH 31.4 09/04/2022 1332   MCHC 33.5 11/28/2022 1507   MCHC 34.6 09/04/2022 1332   RDW 12.7 11/28/2022 1507   Iron Studies    Component Value Date/Time   IRON 62 01/31/2022 1114   TIBC 350 01/31/2022 1114   FERRITIN 25 01/31/2022 1114   IRONPCTSAT 18 01/31/2022 1114   Lipid Panel     Component Value Date/Time   CHOL 179 11/28/2022 1507   TRIG 183 (H) 11/28/2022 1507   HDL 39 (L) 11/28/2022 1507   CHOLHDL 4.6 (H) 11/28/2022 1507   CHOLHDL 5 08/14/2022 1051   VLDL 23.0 08/14/2022 1051   LDLCALC 108 (H) 11/28/2022 1507   Hepatic Function Panel     Component Value Date/Time   PROT 7.1 11/28/2022 1507   ALBUMIN 4.3 11/28/2022 1507   AST 25 11/28/2022 1507   AST 12 (L) 09/04/2022 1332   ALT  25 11/28/2022 1507   ALT 11 09/04/2022 1332   ALKPHOS 96 11/28/2022 1507   BILITOT 0.5 11/28/2022 1507   BILITOT 0.6 09/04/2022 1332      Component Value Date/Time   TSH 0.454 11/28/2022 1507   Nutritional Lab Results  Component Value Date   VD25OH 35.1 11/28/2022   VD25OH 19.68 (L) 08/14/2022   VD25OH 45.35 01/31/2022    ASSOCIATED CONDITIONS ADDRESSED TODAY  ASSESSMENT AND PLAN  Problem List Items Addressed This Visit     Morbid obesity (HCC) with starting BMI 44   Relevant Medications   tirzepatide (MOUNJARO) 5 MG/0.5ML Pen   Hyperlipidemia associated with type 2 diabetes mellitus (HCC)   Relevant Medications   tirzepatide (MOUNJARO) 5 MG/0.5ML Pen   Type 2 diabetes mellitus with obesity (HCC) - Primary   Relevant Medications   tirzepatide (MOUNJARO) 5  MG/0.5ML Pen   BMI 40.0-44.9, adult (HCC)   Relevant Medications   tirzepatide (MOUNJARO) 5 MG/0.5ML Pen   Stage 3a chronic kidney disease (HCC)   Type 2 Diabetes Mellitus with other specified complication, without long-term current use of insulin HgbA1c is not at goal. Last A1c was 7.5- improved, but not at goal.   Cough with ACE.  On Statin therapy earlier this year, but reports stopped due to aches and pains recently.    Medication(s): Mounjaro 2.5 mg SQ weekly and Farxiga 5 mg daily.  Denies mass in neck, dysphagia, dyspepsia, persistent hoarseness, abdominal pain, or N/V/Constipation or diarrhea. Has annual eye exam. Mood is stable.  Some yeast infection in past with [place], but not other side effects reported.  Renal function stable stage 3 a CKD on last labs.   Lab Results  Component Value Date   HGBA1C 7.5 (H) 11/28/2022   HGBA1C 7.7 (H) 08/14/2022   HGBA1C 7.3 03/13/2022   Lab Results  Component Value Date   MICROALBUR <0.7 08/14/2022   LDLCALC 108 (H) 11/28/2022   CREATININE 1.13 (H) 11/28/2022   Lab Results  Component Value Date   GFR 69.90 08/14/2022   GFR 81.38 01/31/2022   GFR 85.58  10/18/2021    Plan: Continue and increase dose Mounjaro 5.0 mg SQ weekly and Farxiga 5 mg daily Continue working on nutrition plan to decrease simple carbohydrates, increase lean proteins and exercise to promote weight loss and improve glycemic control.   Chronic Kidney Disease Stage 3a  Lab results and trends reviewed.  Patient has diabetes. Last GFR was 53- decreased from previous and not at goal. Charlies Silvers earlier this year.  Lab Results  Component Value Date   GFR 69.90 08/14/2022   GFR 81.38 01/31/2022   GFR 85.58 10/18/2021   Lab Results  Component Value Date   CREATININE 1.13 (H) 11/28/2022   BUN 20 11/28/2022   NA 140 11/28/2022   K 4.5 11/28/2022   CL 100 11/28/2022   CO2 24 11/28/2022    Plan: We discussed several lifestyle modifications today and she will continue to work on diet, exercise and weight loss efforts.  Increase water intake. Decrease intake of soda. Patient will avoid nephrotoxic medications such as NSAIDs.  Maximize control of diabetes and hypertension.  Hyperlipidemia LDL is not at goal. Medication(s): Recently stopped statin therapy due to aches and pains. We discussed letting PCP- Hetty Blend, NP-C and Dr. Duke Salvia know she has stopped the medication. She has started GLP/GIP therapy, Mounjaro which likely will help to decrease CV risks.  Cardiovascular risk factors: advanced age (older than 29 for men, 84 for women), diabetes mellitus, dyslipidemia, hypertension, obesity (BMI >= 30 kg/m2), and sedentary lifestyle  Lab Results  Component Value Date   CHOL 179 11/28/2022   HDL 39 (L) 11/28/2022   LDLCALC 108 (H) 11/28/2022   TRIG 183 (H) 11/28/2022   CHOLHDL 4.6 (H) 11/28/2022   CHOLHDL 5 08/14/2022   CHOLHDL 4 10/18/2021   Lab Results  Component Value Date   ALT 25 11/28/2022   AST 25 11/28/2022   ALKPHOS 96 11/28/2022   BILITOT 0.5 11/28/2022   The 10-year ASCVD risk score (Arnett DK, et al., 2019) is: 17.6%   Values  used to calculate the score:     Age: 67 years     Sex: Female     Is Non-Hispanic African American: Yes     Diabetic: Yes     Tobacco smoker: No  Systolic Blood Pressure: 116 mmHg     Is BP treated: Yes     HDL Cholesterol: 39 mg/dL     Total Cholesterol: 179 mg/dL  Plan: Continue to work on nutrition plan -decreasing simple carbohydrates, increasing lean proteins, decreasing saturated fats and cholesterol , avoiding trans fats and exercise as able to promote weight loss, improve lipids and decrease cardiovascular risks.  She has started [facility] which likely should decrease CV risks .  Discussed to let PCP- Hetty Blend, NP-C and Dr. Duke Salvia know she has stopped statin therapy.       ATTESTASTION STATEMENTS:  Reviewed by clinician on day of visit: allergies, medications, problem list, medical history, surgical history, family history, social history, and previous encounter notes.   I have personally spent 50 minutes total time today in preparation, patient care, nutritional counseling and documentation for this visit, including the following: review of clinical lab tests; review of medical tests/procedures/services.      Jaycion Treml, PA-C

## 2023-02-13 ENCOUNTER — Ambulatory Visit (INDEPENDENT_AMBULATORY_CARE_PROVIDER_SITE_OTHER): Payer: Medicare Other | Admitting: Internal Medicine

## 2023-02-13 ENCOUNTER — Encounter: Payer: Self-pay | Admitting: Internal Medicine

## 2023-02-13 VITALS — BP 116/70 | HR 85 | Temp 98.6°F | Ht 62.0 in | Wt 241.2 lb

## 2023-02-13 DIAGNOSIS — J988 Other specified respiratory disorders: Secondary | ICD-10-CM

## 2023-02-13 DIAGNOSIS — R059 Cough, unspecified: Secondary | ICD-10-CM | POA: Diagnosis not present

## 2023-02-13 DIAGNOSIS — J449 Chronic obstructive pulmonary disease, unspecified: Secondary | ICD-10-CM | POA: Diagnosis not present

## 2023-02-13 DIAGNOSIS — U071 COVID-19: Secondary | ICD-10-CM

## 2023-02-13 LAB — POC COVID19 BINAXNOW: SARS Coronavirus 2 Ag: POSITIVE — AB

## 2023-02-13 MED ORDER — NIRMATRELVIR/RITONAVIR (PAXLOVID) TABLET (RENAL DOSING): 2.0000 | ORAL_TABLET | Freq: Two times a day (BID) | ORAL | 0 refills | Status: AC

## 2023-02-13 NOTE — Progress Notes (Signed)
Chief Complaint  Patient presents with   Cough    Patient complains of cough, x2 days, Productive with yellow sputum    Sore Throat         HPI: Leah Lawson 67 y.o. come in for SDA   for 2 days of above sx no fever  but congestion and cough on inhaler Anoro and as needed albuterol . Not a lot of wheezing . Ears congestion st  Was at gathering this weekend ? No one sick ? Lives alone .  On oxycodone for wrist fx has fu tomorrow   Has hx of copd.  And hx of bronchitis .  Has had covid19 infection imm x 4     has had covid infection 2 other times  initial and other one time at hospital( initial)  not last time  .  ROS: See pertinent positives and negatives p er HPI.  Past Medical History:  Diagnosis Date   Arthritis    Asthma    Back pain    Cataract    Chronic kidney disease    Constipation    COPD (chronic obstructive pulmonary disease) (HCC)    COPD (chronic obstructive pulmonary disease) (HCC)    Diabetes (HCC)    Diabetes mellitus without complication (HCC)    "pre-diabetic"   Emphysema of lung (HCC)    Glaucoma    Heartburn    High cholesterol    Hypertension    Hypertensive retinopathy 10/26/2021   Lactose intolerance    Open-angle glaucoma 10/26/2021   Other specified disorders of thyroid    Sleep apnea    SOB (shortness of breath)    Vitamin D deficiency     Family History  Problem Relation Age of Onset   Heart failure Mother    Arthritis Mother    Diabetes Mother    High blood pressure Mother    Colon polyps Father    Colon cancer Father    Cancer Father        prostate, lung   COPD Father    High Cholesterol Father    High blood pressure Father    Dementia Father    Diabetes Brother    High blood pressure Brother    Stroke Brother    Cancer Maternal Grandfather    Miscarriages / Stillbirths Daughter    Breast cancer Cousin    Esophageal cancer Neg Hx    Liver disease Neg Hx    Rectal cancer Neg Hx    Stomach cancer Neg Hx      Social History   Socioeconomic History   Marital status: Single    Spouse name: Not on file   Number of children: 4   Years of education: Not on file   Highest education level: Master's degree (e.g., MA, MS, MEng, MEd, MSW, MBA)  Occupational History   Occupation: retired  Tobacco Use   Smoking status: Former    Current packs/day: 0.00    Average packs/day: 0.8 packs/day for 46.0 years (34.5 ttl pk-yrs)    Types: Cigarettes    Start date: 05/05/1972    Quit date: 05/05/2018    Years since quitting: 4.7    Passive exposure: Past   Smokeless tobacco: Never  Vaping Use   Vaping status: Never Used  Substance and Sexual Activity   Alcohol use: Not Currently   Drug use: Never   Sexual activity: Not on file  Other Topics Concern   Not on file  Social History Narrative  Not on file   Social Determinants of Health   Financial Resource Strain: Low Risk  (12/16/2022)   Overall Financial Resource Strain (CARDIA)    Difficulty of Paying Living Expenses: Not hard at all  Food Insecurity: No Food Insecurity (12/16/2022)   Hunger Vital Sign    Worried About Running Out of Food in the Last Year: Never true    Ran Out of Food in the Last Year: Never true  Transportation Needs: No Transportation Needs (12/16/2022)   PRAPARE - Administrator, Civil Service (Medical): No    Lack of Transportation (Non-Medical): No  Physical Activity: Insufficiently Active (12/16/2022)   Exercise Vital Sign    Days of Exercise per Week: 4 days    Minutes of Exercise per Session: 20 min  Stress: No Stress Concern Present (12/16/2022)   [employer] of Occupational Health - Occupational Stress Questionnaire    Feeling of Stress : Not at all  Social Connections: Moderately Integrated (12/16/2022)   Social Connection and Isolation Panel [NHANES]    Frequency of Communication with Friends and Family: More than three times a week    Frequency of Social Gatherings with Friends and Family:  Twice a week    Attends Religious Services: More than 4 times per year    Active Member of [employer] or Organizations: Yes    Attends Engineer, structural: More than 4 times per year    Marital Status: Never married    Outpatient Medications Prior to Visit  Medication Sig Dispense Refill   albuterol (VENTOLIN HFA) 108 (90 Base) MCG/ACT inhaler INHALE 2 PUFFS BY MOUTH EVERY 6 HOURS AS NEEDED FOR WHEEZING AND FOR SHORTNESS OF BREATH 9 g 3   clotrimazole-betamethasone (LOTRISONE) cream Apply 1 Application topically daily. 30 g 0   dapagliflozin propanediol (FARXIGA) 5 MG TABS tablet TAKE 1 TABLET BY MOUTH ONCE DAILY BEFORE BREAKFAST 90 tablet 0   losartan-hydrochlorothiazide (HYZAAR) 100-25 MG tablet Take 1 tablet by mouth once daily 90 tablet 1   omeprazole (PRILOSEC) 20 MG capsule Take 1 capsule (20 mg total) by mouth daily. 90 capsule 3   oxyCODONE-acetaminophen (PERCOCET) 10-325 MG tablet Take 1 tablet by mouth as needed for pain.     rosuvastatin (CRESTOR) 10 MG tablet TAKE 1 TABLET MONDAY, WEDNESDAY, AND FRIDAY ONLY 40 tablet 3   spironolactone (ALDACTONE) 25 MG tablet Take 1 tablet by mouth once daily 90 tablet 2   tirzepatide (MOUNJARO) 5 MG/0.5ML Pen Inject 5 mg into the skin once a week. 2 mL 0   triamcinolone cream (KENALOG) 0.1 % Apply 1 Application topically 2 (two) times daily. 30 g 0   umeclidinium-vilanterol (ANORO ELLIPTA) 62.5-25 MCG/ACT AEPB Inhale 1 puff into the lungs daily. 60 each 5   No facility-administered medications prior to visit.     EXAM:  BP 116/70 (BP Location: Right Arm, Patient Position: Sitting, Cuff Size: Large)   Pulse 85   Temp 98.6 F (37 C) (Oral)   Ht 5\' 2"  (1.575 m)   Wt 241 lb 3.2 oz (109.4 kg)   SpO2 92%   BMI 44.12 kg/m   Body mass index is 44.12 kg/m.  GENERAL: vitals reviewed and listed above, alert, oriented, appears well hydrated and in no acute distress masked  HEENT: atraumatic, conjunctiva  clear, no obvious abnormalities  on inspection of external nose and ears tms clear  face non toender  NECK: no obvious masses on inspection palpation  LUNGS: clear to auscultation bilaterally, no  wheezes, rales or rhonchi, good air movement ocass cough  deep  CV: HRRR, no clubbing cyanosis or  peripheral edema nl cap refill  MS: moves all extremities without noticeable focal  abnormality left wrist in splint cast PSYCH: pleasant and cooperative, no obvious depression or anxiety Lab Results  Component Value Date   WBC 6.8 11/28/2022   HGB 16.9 (H) 11/28/2022   HCT 50.4 (H) 11/28/2022   PLT 332 11/28/2022   GLUCOSE 172 (H) 11/28/2022   CHOL 179 11/28/2022   TRIG 183 (H) 11/28/2022   HDL 39 (L) 11/28/2022   LDLCALC 108 (H) 11/28/2022   ALT 25 11/28/2022   AST 25 11/28/2022   NA 140 11/28/2022   K 4.5 11/28/2022   CL 100 11/28/2022   CREATININE 1.13 (H) 11/28/2022   BUN 20 11/28/2022   CO2 24 11/28/2022   TSH 0.454 11/28/2022   HGBA1C 7.5 (H) 11/28/2022   MICROALBUR <0.7 08/14/2022   BP Readings from Last 3 Encounters:  02/13/23 116/70  02/11/23 97/65  01/28/23 132/84    ASSESSMENT AND PLAN:  Discussed the following assessment and plan:  Respiratory tract infection due to COVID-19 virus - 3rd time   has had multiple  immunizations  lat in 2023  Cough, unspecified type - Plan: POC COVID-19  Chronic obstructive pulmonary disease, unspecified COPD type (HCC) Gfr last was 53 in April  Disc risk benefit of paxlovid  since she does have inc risk   will add med renal dosing   .wxpm and care   urgent ed care if alarm sx . Change med appts to next week . Caution with pain meds . Hold farxiga if getting dehydrated  FU with pcp  or team if not improving  -Patient advised to return or notify health care team  if  new concerns arise.  Patient Instructions  Lungs sound clear today  You have  covid infection . Can begin  antiviral  paxlovid  within 5 days of onset of infection.  Stay on your inhalers   and add  your ventolin    up t every 6  hours as needed.  Rest and fluids    if dehydrated then  stop the farxiga tenporarily .   If gets shortness of breath that is severe  seem emrgency care  Hopefullly will not  get worse.   Expect congestion for the next week or so  but should be ok after weekend.   Change your   appt for tomorrow  .   Fu with PCP if needed      Neta Mends. Seeley Southgate M.D.

## 2023-02-13 NOTE — Patient Instructions (Signed)
Lungs sound clear today  You have  covid infection . Can begin  antiviral  paxlovid  within 5 days of onset of infection.  Stay on your inhalers   and add your ventolin    up t every 6  hours as needed.  Rest and fluids    if dehydrated then  stop the farxiga tenporarily .   If gets shortness of breath that is severe  seem emrgency care  Hopefullly will not  get worse.   Expect congestion for the next week or so  but should be ok after weekend.   Change your   appt for tomorrow  .   Fu with PCP if needed

## 2023-02-15 ENCOUNTER — Other Ambulatory Visit: Payer: Self-pay | Admitting: Adult Health

## 2023-03-03 ENCOUNTER — Other Ambulatory Visit: Payer: Self-pay | Admitting: Physician Assistant

## 2023-03-03 DIAGNOSIS — D751 Secondary polycythemia: Secondary | ICD-10-CM

## 2023-03-04 ENCOUNTER — Inpatient Hospital Stay: Payer: Medicare Other | Admitting: Physician Assistant

## 2023-03-04 ENCOUNTER — Inpatient Hospital Stay: Payer: Medicare Other | Attending: Physician Assistant

## 2023-03-04 ENCOUNTER — Ambulatory Visit (INDEPENDENT_AMBULATORY_CARE_PROVIDER_SITE_OTHER): Payer: Medicare Other | Admitting: Physician Assistant

## 2023-03-04 VITALS — BP 103/54 | HR 83 | Temp 98.2°F | Resp 18 | Wt 238.8 lb

## 2023-03-04 DIAGNOSIS — D751 Secondary polycythemia: Secondary | ICD-10-CM | POA: Diagnosis present

## 2023-03-04 DIAGNOSIS — Z87891 Personal history of nicotine dependence: Secondary | ICD-10-CM | POA: Insufficient documentation

## 2023-03-04 DIAGNOSIS — Z801 Family history of malignant neoplasm of trachea, bronchus and lung: Secondary | ICD-10-CM | POA: Insufficient documentation

## 2023-03-04 DIAGNOSIS — Z8042 Family history of malignant neoplasm of prostate: Secondary | ICD-10-CM | POA: Insufficient documentation

## 2023-03-04 DIAGNOSIS — G4733 Obstructive sleep apnea (adult) (pediatric): Secondary | ICD-10-CM | POA: Diagnosis not present

## 2023-03-04 DIAGNOSIS — Z800 Family history of malignant neoplasm of digestive organs: Secondary | ICD-10-CM | POA: Insufficient documentation

## 2023-03-04 DIAGNOSIS — Z803 Family history of malignant neoplasm of breast: Secondary | ICD-10-CM | POA: Insufficient documentation

## 2023-03-04 LAB — CMP (CANCER CENTER ONLY)
ALT: 14 U/L (ref 0–44)
AST: 14 U/L — ABNORMAL LOW (ref 15–41)
Albumin: 4.1 g/dL (ref 3.5–5.0)
Alkaline Phosphatase: 73 U/L (ref 38–126)
Anion gap: 7 (ref 5–15)
BUN: 25 mg/dL — ABNORMAL HIGH (ref 8–23)
CO2: 28 mmol/L (ref 22–32)
Calcium: 9.7 mg/dL (ref 8.9–10.3)
Chloride: 106 mmol/L (ref 98–111)
Creatinine: 0.94 mg/dL (ref 0.44–1.00)
GFR, Estimated: >60 mL/min (ref >60)
Glucose, Bld: 111 mg/dL — ABNORMAL HIGH (ref 70–99)
Potassium: 4.2 mmol/L (ref 3.5–5.1)
Sodium: 141 mmol/L (ref 135–145)
Total Bilirubin: 0.4 mg/dL (ref 0.3–1.2)
Total Protein: 6.9 g/dL (ref 6.5–8.1)

## 2023-03-04 LAB — CBC WITH DIFFERENTIAL (CANCER CENTER ONLY)
Abs Immature Granulocytes: 0.07 10*3/uL (ref 0.00–0.07)
Basophils Absolute: 0 10*3/uL (ref 0.0–0.1)
Basophils Relative: 1 %
Eosinophils Absolute: 0.1 10*3/uL (ref 0.0–0.5)
Eosinophils Relative: 2 %
HCT: 45 % (ref 36.0–46.0)
Hemoglobin: 15.6 g/dL — ABNORMAL HIGH (ref 12.0–15.0)
Immature Granulocytes: 1 %
Lymphocytes Relative: 26 %
Lymphs Abs: 1.7 10*3/uL (ref 0.7–4.0)
MCH: 31 pg (ref 26.0–34.0)
MCHC: 34.7 g/dL (ref 30.0–36.0)
MCV: 89.3 fL (ref 80.0–100.0)
Monocytes Absolute: 0.5 10*3/uL (ref 0.1–1.0)
Monocytes Relative: 8 %
Neutro Abs: 4 10*3/uL (ref 1.7–7.7)
Neutrophils Relative %: 62 %
Platelet Count: 286 10*3/uL (ref 150–400)
RBC: 5.04 MIL/uL (ref 3.87–5.11)
RDW: 13.2 % (ref 11.5–15.5)
WBC Count: 6.4 10*3/uL (ref 4.0–10.5)
nRBC: 0 % (ref 0.0–0.2)

## 2023-03-04 NOTE — Progress Notes (Signed)
[facility] [phone]   [phone]  PROGRESS NOTE  Patient Care Team: Avanell Shackleton, NP-C as PCP - General (Family Medicine) Davina Poke as Consulting Physician (Optometry)  Hematological/Oncological History 10/18/2021: WBC 5.9, Hgb 16.1, Hct 48.8%, Plt 237 01/31/2022: WBC 5.0, Hgb 16.3, Hct 49.9%, Plt 239 07/10/2022: WBC 5.6, Hgb 17.7, Hct 52.5%, Plt 286 08/14/2022: WBC 5.8, Hgb 16.5, Hct 49.7%, Plt 260 09/04/2022: Establish care with [facility] Hematology MPN panel was negative for driver mutations BCR/ABL FISH negative for gene rearrangement  CHIEF COMPLAINTS/PURPOSE OF CONSULTATION:  "Secondary Polycythemia "  HISTORY OF PRESENTING ILLNESS:  Leah Lawson 67 y.o. 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 She is unaccompanied for this visit.   On exam today, Leah Lawson reports that she continues on her CPAP machine and 2 L of supplemental oxygen at night for OSA. Her energy levels are stable. She reports numbness in her hands when she wakes up that resolves with stretches. She recently had a mechanical fall and fractured her left wrist. She is otherwise feeling well.  She denies fevers, chills, sweats, chest pain or cough. She has no other complaints.   MEDICAL HISTORY:  Past Medical History:  Diagnosis Date   Arthritis    Asthma    Back pain    Cataract    Chronic kidney disease    Constipation    COPD (chronic obstructive pulmonary disease) (HCC)    COPD (chronic obstructive pulmonary disease) (HCC)    Diabetes (HCC)    Diabetes mellitus without complication (HCC)    "pre-diabetic"   Emphysema of lung (HCC)    Glaucoma    Heartburn    High cholesterol    Hypertension    Hypertensive retinopathy 10/26/2021   Lactose intolerance    Open-angle glaucoma 10/26/2021   Other specified disorders of thyroid    Sleep apnea    SOB (shortness of breath)    Vitamin D deficiency     SURGICAL HISTORY: Past Surgical  History:  Procedure Laterality Date   BREAST SURGERY      SOCIAL HISTORY: Social History   Socioeconomic History   Marital status: Single    Spouse name: Not on file   Number of children: 4   Years of education: Not on file   Highest education level: Master's degree (e.g., MA, MS, MEng, MEd, MSW, MBA)  Occupational History   Occupation: retired  Tobacco Use   Smoking status: Former    Current packs/day: 0.00    Average packs/day: 0.8 packs/day for 46.0 years (34.5 ttl pk-yrs)    Types: Cigarettes    Start date: 05/05/1972    Quit date: 05/05/2018    Years since quitting: 4.8    Passive exposure: Past   Smokeless tobacco: Never  Vaping Use   Vaping status: Never Used  Substance and Sexual Activity   Alcohol use: Not Currently   Drug use: Never   Sexual activity: Not on file  Other Topics Concern   Not on file  Social History Narrative   Not on file   Social Determinants of Health   Financial Resource Strain: Low Risk  (12/16/2022)   Overall Financial Resource Strain (CARDIA)    Difficulty of Paying Living Expenses: Not hard at all  Food Insecurity: No Food Insecurity (12/16/2022)   Hunger Vital Sign    Worried About Running Out of Food in the Last Year: Never true    Ran Out of Food in the Last Year:  Never true  Transportation Needs: No Transportation Needs (12/16/2022)   PRAPARE - Administrator, Civil Service (Medical): No    Lack of Transportation (Non-Medical): No  Physical Activity: Insufficiently Active (12/16/2022)   Exercise Vital Sign    Days of Exercise per Week: 4 days    Minutes of Exercise per Session: 20 min  Stress: No Stress Concern Present (12/16/2022)   [employer] of Occupational Health - Occupational Stress Questionnaire    Feeling of Stress : Not at all  Social Connections: Moderately Integrated (12/16/2022)   Social Connection and Isolation Panel [NHANES]    Frequency of Communication with Friends and Family: More than three  times a week    Frequency of Social Gatherings with Friends and Family: Twice a week    Attends Religious Services: More than 4 times per year    Active Member of [employer] or Organizations: Yes    Attends Engineer, structural: More than 4 times per year    Marital Status: Never married  Catering manager Violence: Not on file    FAMILY HISTORY: Family History  Problem Relation Age of Onset   Heart failure Mother    Arthritis Mother    Diabetes Mother    High blood pressure Mother    Colon polyps Father    Colon cancer Father    Cancer Father        prostate, lung   COPD Father    High Cholesterol Father    High blood pressure Father    Dementia Father    Diabetes Brother    High blood pressure Brother    Stroke Brother    Cancer Maternal Grandfather    Miscarriages / Stillbirths Daughter    Breast cancer Cousin    Esophageal cancer Neg Hx    Liver disease Neg Hx    Rectal cancer Neg Hx    Stomach cancer Neg Hx     ALLERGIES:  is allergic to hydralazine, lisinopril, singulair [montelukast sodium], and mucinex [guaifenesin er].  MEDICATIONS:  Current Outpatient Medications  Medication Sig Dispense Refill   albuterol (VENTOLIN HFA) 108 (90 Base) MCG/ACT inhaler INHALE 2 PUFFS BY MOUTH EVERY 6 HOURS AS NEEDED FOR WHEEZING AND FOR SHORTNESS OF BREATH 9 g 3   ANORO ELLIPTA 62.5-25 MCG/ACT AEPB Inhale 1 puff by mouth once daily 60 each 0   clotrimazole-betamethasone (LOTRISONE) cream Apply 1 Application topically daily. 30 g 0   dapagliflozin propanediol (FARXIGA) 5 MG TABS tablet TAKE 1 TABLET BY MOUTH ONCE DAILY BEFORE BREAKFAST 90 tablet 0   losartan-hydrochlorothiazide (HYZAAR) 100-25 MG tablet Take 1 tablet by mouth once daily 90 tablet 1   omeprazole (PRILOSEC) 20 MG capsule Take 1 capsule (20 mg total) by mouth daily. 90 capsule 3   oxyCODONE-acetaminophen (PERCOCET) 10-325 MG tablet Take 1 tablet by mouth as needed for pain.     rosuvastatin (CRESTOR) 10 MG  tablet TAKE 1 TABLET MONDAY, WEDNESDAY, AND FRIDAY ONLY 40 tablet 3   spironolactone (ALDACTONE) 25 MG tablet Take 1 tablet by mouth once daily 90 tablet 2   tirzepatide (MOUNJARO) 5 MG/0.5ML Pen Inject 5 mg into the skin once a week. 2 mL 0   triamcinolone cream (KENALOG) 0.1 % Apply 1 Application topically 2 (two) times daily. 30 g 0   No current facility-administered medications for this visit.    REVIEW OF SYSTEMS:   Constitutional: ( - ) fevers, ( - )  chills , ( - ) night  sweats Eyes: ( - ) blurriness of vision, ( - ) double vision, ( - ) watery eyes Ears, nose, mouth, throat, and face: ( - ) mucositis, ( - ) sore throat Respiratory: ( - ) cough, ( - ) dyspnea, ( - ) wheezes Cardiovascular: ( - ) palpitation, ( - ) chest discomfort, ( - ) lower extremity swelling Gastrointestinal:  ( - ) nausea, ( - ) heartburn, ( - ) change in bowel habits Skin: ( - ) abnormal skin rashes Lymphatics: ( - ) new lymphadenopathy, ( - ) easy bruising Neurological: ( - ) numbness, ( - ) tingling, ( - ) new weaknesses Behavioral/Psych: ( - ) mood change, ( - ) new changes  All other systems were reviewed with the patient and are negative.  PHYSICAL EXAMINATION: ECOG PERFORMANCE STATUS: 0 - Asymptomatic  Vitals:   03/04/23 1030  BP: (!) 103/54  Pulse: 83  Resp: 18  Temp: 98.2 F (36.8 C)  SpO2: 96%   Filed Weights   03/04/23 1030  Weight: 238 lb 12.8 oz (108.3 kg)    GENERAL: well appearing female in NAD  SKIN: skin color, texture, turgor are normal, no rashes or significant lesions EYES: conjunctiva are pink and non-injected, sclera clear LUNGS: clear to auscultation and percussion with normal breathing effort HEART: regular rate & rhythm and no murmurs and no lower extremity edema Musculoskeletal: no cyanosis of digits and no clubbing  PSYCH: alert & oriented x 3, fluent speech NEURO: no focal motor/sensory deficits  LABORATORY DATA:  I have reviewed the data as listed    Latest  Ref Rng & Units 03/04/2023   10:14 AM 11/28/2022    3:07 PM 09/04/2022    1:32 PM  CBC  WBC 4.0 - 10.5 K/uL 6.4  6.8  7.0   Hemoglobin 12.0 - 15.0 g/dL 16.1  09.6  04.5   Hematocrit 36.0 - 46.0 % 45.0  50.4  48.6   Platelets 150 - 400 K/uL 286  332  236        Latest Ref Rng & Units 03/04/2023   10:14 AM 11/28/2022    3:07 PM 09/04/2022    1:32 PM  CMP  Glucose 70 - 99 mg/dL 409  811  96   BUN 8 - 23 mg/dL 25  20  26    Creatinine 0.44 - 1.00 mg/dL 9.14  7.82  9.56   Sodium 135 - 145 mmol/L 141  140  140   Potassium 3.5 - 5.1 mmol/L 4.2  4.5  4.3   Chloride 98 - 111 mmol/L 106  100  105   CO2 22 - 32 mmol/L 28  24  28    Calcium 8.9 - 10.3 mg/dL 9.7  21.3  08.6   Total Protein 6.5 - 8.1 g/dL 6.9  7.1  7.4   Total Bilirubin 0.3 - 1.2 mg/dL 0.4  0.5  0.6   Alkaline Phos 38 - 126 U/L 73  96  69   AST 15 - 41 U/L 14  25  12    ALT 0 - 44 U/L 14  25  11       ASSESSMENT & PLAN Leah Lawson is a 67 y.o. 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   #Secondary Polycythemia: --workup from 09/04/2022 showed no evidence of driver mutations and no evidence of BCR/ABL gene rearrangement.  --patient is a non smoker and does not use any testosterone containing products --patient was diagnosed with OSA and is currently on  CPAP machine and 2 L of supplemental oxygen at night.This is the likely cause of patient's polycythemia.  --Labs today show improvement of polycythemia from 16.8 to 15.6 today. No other cytopenias.  --Continue to monitor counts as patient continues on CPAP/supplemental oxygen for OSA. --RTC in 12 months for labs and follow up   No orders of the defined types were placed in this encounter.   All questions were answered. The patient knows to call the clinic with any problems, questions or concerns.  I have spent a total of 25 minutes minutes of face-to-face and non-face-to-face time, preparing to see the patient, performing a medically appropriate examination,  counseling and educating the patient,documenting clinical information in the electronic health record, and care coordination.   Georga Kaufmann, PA-C Department of Hematology/Oncology [facility] at [facility] Phone: [phone]

## 2023-03-05 ENCOUNTER — Ambulatory Visit (INDEPENDENT_AMBULATORY_CARE_PROVIDER_SITE_OTHER): Payer: Medicare Other | Admitting: Physician Assistant

## 2023-03-06 ENCOUNTER — Other Ambulatory Visit (INDEPENDENT_AMBULATORY_CARE_PROVIDER_SITE_OTHER): Payer: Self-pay | Admitting: Physician Assistant

## 2023-03-06 DIAGNOSIS — E1169 Type 2 diabetes mellitus with other specified complication: Secondary | ICD-10-CM

## 2023-03-08 ENCOUNTER — Other Ambulatory Visit (INDEPENDENT_AMBULATORY_CARE_PROVIDER_SITE_OTHER): Payer: Self-pay | Admitting: Physician Assistant

## 2023-03-08 DIAGNOSIS — E1169 Type 2 diabetes mellitus with other specified complication: Secondary | ICD-10-CM

## 2023-03-12 ENCOUNTER — Encounter (INDEPENDENT_AMBULATORY_CARE_PROVIDER_SITE_OTHER): Payer: Self-pay | Admitting: Physician Assistant

## 2023-03-12 ENCOUNTER — Ambulatory Visit (INDEPENDENT_AMBULATORY_CARE_PROVIDER_SITE_OTHER): Payer: Medicare Other | Admitting: Physician Assistant

## 2023-03-12 VITALS — BP 138/73 | HR 77 | Temp 98.3°F | Ht 62.0 in | Wt 234.0 lb

## 2023-03-12 DIAGNOSIS — Z7985 Long-term (current) use of injectable non-insulin antidiabetic drugs: Secondary | ICD-10-CM

## 2023-03-12 DIAGNOSIS — E669 Obesity, unspecified: Secondary | ICD-10-CM

## 2023-03-12 DIAGNOSIS — Z6841 Body Mass Index (BMI) 40.0 and over, adult: Secondary | ICD-10-CM

## 2023-03-12 DIAGNOSIS — M25539 Pain in unspecified wrist: Secondary | ICD-10-CM

## 2023-03-12 DIAGNOSIS — E1169 Type 2 diabetes mellitus with other specified complication: Secondary | ICD-10-CM | POA: Diagnosis not present

## 2023-03-12 DIAGNOSIS — E785 Hyperlipidemia, unspecified: Secondary | ICD-10-CM

## 2023-03-12 DIAGNOSIS — M25532 Pain in left wrist: Secondary | ICD-10-CM

## 2023-03-12 MED ORDER — TIRZEPATIDE 7.5 MG/0.5ML ~~LOC~~ SOAJ
7.5000 mg | SUBCUTANEOUS | 0 refills | Status: DC
Start: 2023-03-12 — End: 2023-04-16

## 2023-03-12 NOTE — Progress Notes (Signed)
.smr  Office: [phone]  /  Fax: [phone]  WEIGHT SUMMARY AND BIOMETRICS  Vitals Temp: 98.3 F (36.8 C) BP: 138/73 Pulse Rate: 77 SpO2: 95 %   Anthropometric Measurements Height: 5\' 2"  (1.575 m) Weight: 234 lb (106.1 kg) BMI (Calculated): 42.79 Weight at Last Visit: 240 lb Weight Lost Since Last Visit: 6 lb Weight Gained Since Last Visit: 0 Starting Weight: 241 lb Total Weight Loss (lbs): 7 lb (3.175 kg) Peak Weight: 250 lb   Body Composition  Body Fat %: 47.8 % Fat Mass (lbs): 112.2 lbs Muscle Mass (lbs): 116.2 lbs Total Body Water (lbs): 78.2 lbs Visceral Fat Rating : 17   Other Clinical Data Fasting: no Labs: no Today's Visit #: 5 Starting Date: 11/27/22     HPI  Chief Complaint: OBESITY  Leah Lawson is here to discuss her progress with her obesity treatment plan. She is on the the Category 3 Plan and states she is following her eating plan approximately 50 % of the time. She states she is exercising 0 minutes 0 times per week.  Discussed the use of AI scribe software for clinical note transcription with the patient, who gave verbal consent to proceed.  History of Present Illness      The patient is a 67 year old female with a history of type 2 diabetes, hyperlipidemia, and chronic kidney disease. She is currently on [place] 5 mg daily. She recently recovered from COVID-19, which has limited her physical activity. She reports a decrease in muscle mass and some weight loss. She has been struggling with her diet, trying to find foods that are both satisfying and healthy. She has been experiencing hunger shortly after meals and is trying to understand the effects of her medication on her appetite. She also reports discomfort from a wrist injury.     Interval History:  Since last office visit she down 6 lbs.  Hunger/appetite-fair control. Not skipping meals. Not fully satiated. Frequently hungry especially after evening meal  Cravings-  increased cravings after meals. Discussed better protein based snack options. She tried [employer] and likes these.  Sleep- Mostly restorative overall using CPAP for OSA Exercise-Limited due to recent Covid infection and left wrist injury    Pharmacotherapy: Mounjaro 5 mg weekly for Type 2 diabetes. Denies mass in neck, dysphagia, dyspepsia, persistent hoarseness, abdominal pain, or N/V/Constipation or diarrhea. Has annual eye exam and being followed more closely for glaucoma- Has follow up with Ophthalmology next month to recheck pressure.  Mood is stable.    TREATMENT PLAN FOR OBESITY: Obesity Decreased muscle mass and adipose tissue. Limited physical activity due to recent COVID-19 infection. Discussed the importance of maintaining muscle mass and hydration, especially while on Farxiga. -Increase physical activity as tolerated, consider indoor walking videos. -Continue Farxiga 5mg  daily, ensure adequate hydration. -Continue Mounjaro, increase to 7.5mg  weekly in two weeks. -Consider dietary modifications, including high protein cereal and lean meats.  Recommended Dietary Goals  Leah Lawson is currently in the action stage of change. As such, her goal is to continue weight management plan. She has agreed to the Category 3 Plan.  Behavioral Intervention  We discussed the following Behavioral Modification Strategies today: increasing lean protein intake, decreasing simple carbohydrates , increasing vegetables, increasing lower glycemic fruits, increasing fiber rich foods, increasing water intake, decreasing eating out or consumption of processed foods, and making healthy choices when eating convenient foods, continue to practice mindfulness when eating, planning for success, and better snacking choices.  Additional resources provided today: Smart  fruit hand out      100 calorie protein snack hand out  Recommended Physical Activity Goals  Leah Lawson has been advised to work up to 150 minutes  of moderate intensity aerobic activity a week and strengthening exercises 2-3 times per week for cardiovascular health, weight loss maintenance and preservation of muscle mass.   She has agreed to Think about ways to increase daily physical activity and overcoming barriers to exercise   Pharmacotherapy We discussed various medication options to help Karizma with her weight loss efforts and we both agreed to increase Mounjaro to 7.5 mg weekly for Type 2 diabetes.    Return in about 3 weeks (around 04/02/2023).[name] She was informed of the importance of frequent follow up visits to maximize her success with intensive lifestyle modifications for her multiple health conditions.  PHYSICAL EXAM:  Blood pressure 138/73, pulse 77, temperature 98.3 F (36.8 C), height 5\' 2"  (1.575 m), weight 234 lb (106.1 kg), SpO2 95%. Body mass index is 42.8 kg/m.  General: She is overweight, cooperative, alert, well developed, and in no acute distress. PSYCH: Has normal mood, affect and thought process.   Cardiovascular: HR 70's regular, BP 138/73 Lungs: Normal breathing effort, no conversational dyspnea.  DIAGNOSTIC DATA REVIEWED:  BMET    Component Value Date/Time   NA 141 03/04/2023 1014   NA 140 11/28/2022 1507   K 4.2 03/04/2023 1014   CL 106 03/04/2023 1014   CO2 28 03/04/2023 1014   GLUCOSE 111 (H) 03/04/2023 1014   BUN 25 (H) 03/04/2023 1014   BUN 20 11/28/2022 1507   CREATININE 0.94 03/04/2023 1014   CALCIUM 9.7 03/04/2023 1014   GFRNONAA >60 03/04/2023 1014   GFRAA >60 11/30/2019 0658   Lab Results  Component Value Date   HGBA1C 7.5 (H) 11/28/2022   HGBA1C 7.2 (H) 11/26/2019   Lab Results  Component Value Date   INSULIN 127.0 (H) 11/28/2022   Lab Results  Component Value Date   TSH 0.454 11/28/2022   CBC    Component Value Date/Time   WBC 6.4 03/04/2023 1014   WBC 5.8 08/14/2022 1051   RBC 5.04 03/04/2023 1014   HGB 15.6 (H) 03/04/2023 1014   HGB 16.9 (H) 11/28/2022 1507    HCT 45.0 03/04/2023 1014   HCT 50.4 (H) 11/28/2022 1507   PLT 286 03/04/2023 1014   PLT 332 11/28/2022 1507   MCV 89.3 03/04/2023 1014   MCV 92 11/28/2022 1507   MCH 31.0 03/04/2023 1014   MCHC 34.7 03/04/2023 1014   RDW 13.2 03/04/2023 1014   RDW 12.7 11/28/2022 1507   Iron Studies    Component Value Date/Time   IRON 62 01/31/2022 1114   TIBC 350 01/31/2022 1114   FERRITIN 25 01/31/2022 1114   IRONPCTSAT 18 01/31/2022 1114   Lipid Panel     Component Value Date/Time   CHOL 179 11/28/2022 1507   TRIG 183 (H) 11/28/2022 1507   HDL 39 (L) 11/28/2022 1507   CHOLHDL 4.6 (H) 11/28/2022 1507   CHOLHDL 5 08/14/2022 1051   VLDL 23.0 08/14/2022 1051   LDLCALC 108 (H) 11/28/2022 1507   Hepatic Function Panel     Component Value Date/Time   PROT 6.9 03/04/2023 1014   PROT 7.1 11/28/2022 1507   ALBUMIN 4.1 03/04/2023 1014   ALBUMIN 4.3 11/28/2022 1507   AST 14 (L) 03/04/2023 1014   ALT 14 03/04/2023 1014   ALKPHOS 73 03/04/2023 1014   BILITOT 0.4 03/04/2023 1014  Component Value Date/Time   TSH 0.454 11/28/2022 1507   Nutritional Lab Results  Component Value Date   VD25OH 35.1 11/28/2022   VD25OH 19.68 (L) 08/14/2022   VD25OH 45.35 01/31/2022    ASSOCIATED CONDITIONS ADDRESSED TODAY  ASSESSMENT AND PLAN  Problem List Items Addressed This Visit     Morbid obesity (HCC) with starting BMI 44   Relevant Medications   tirzepatide (MOUNJARO) 7.5 MG/0.5ML Pen   Hyperlipidemia associated with type 2 diabetes mellitus (HCC)   Relevant Medications   tirzepatide (MOUNJARO) 7.5 MG/0.5ML Pen   Type 2 diabetes mellitus with obesity (HCC) - Primary   Relevant Medications   tirzepatide (MOUNJARO) 7.5 MG/0.5ML Pen    Type 2 Diabetes Managed with [place] and Mounjaro. Discussed the impact of diet on blood sugar control. -Continue Farxiga 5mg  daily and Mounjaro, increase to 7.5mg  weekly in two weeks after completing 5 mg doses. -Consider dietary modifications, including  high protein cereal and lean meats. Discussed Kevin's meats.   Hyperlipidemia Managed with Greggory Keen. -Continue Mounjaro, increase to 7.5mg  weekly in two weeks.  Wrist Pain Ongoing pain following bone healing. Limited range of motion. -Consider follow-up with orthopedic specialist if pain persists.   ATTESTASTION STATEMENTS:  Reviewed by clinician on day of visit: allergies, medications, problem list, medical history, surgical history, family history, social history, and previous encounter notes.   I have personally spent 49 minutes total time today in preparation, patient care, nutritional counseling and documentation for this visit, including the following: review of clinical lab tests; review of medical tests/procedures/services.       , PA-C

## 2023-03-16 DIAGNOSIS — M25532 Pain in left wrist: Secondary | ICD-10-CM | POA: Insufficient documentation

## 2023-03-20 ENCOUNTER — Other Ambulatory Visit: Payer: Self-pay | Admitting: Family Medicine

## 2023-03-20 DIAGNOSIS — E119 Type 2 diabetes mellitus without complications: Secondary | ICD-10-CM

## 2023-03-21 ENCOUNTER — Other Ambulatory Visit: Payer: Self-pay | Admitting: Adult Health

## 2023-03-25 DIAGNOSIS — G5602 Carpal tunnel syndrome, left upper limb: Secondary | ICD-10-CM | POA: Insufficient documentation

## 2023-03-26 LAB — HM DIABETES FOOT EXAM: HM Diabetic Foot Exam: NORMAL

## 2023-03-26 LAB — HM DEXA SCAN

## 2023-03-28 ENCOUNTER — Other Ambulatory Visit: Payer: Self-pay | Admitting: Pulmonary Disease

## 2023-04-02 ENCOUNTER — Ambulatory Visit (INDEPENDENT_AMBULATORY_CARE_PROVIDER_SITE_OTHER): Payer: Medicare Other | Admitting: Physician Assistant

## 2023-04-02 ENCOUNTER — Encounter (INDEPENDENT_AMBULATORY_CARE_PROVIDER_SITE_OTHER): Payer: Self-pay | Admitting: Physician Assistant

## 2023-04-02 VITALS — BP 100/65 | HR 94 | Temp 98.1°F | Ht 62.0 in | Wt 234.0 lb

## 2023-04-02 DIAGNOSIS — E1169 Type 2 diabetes mellitus with other specified complication: Secondary | ICD-10-CM | POA: Diagnosis not present

## 2023-04-02 DIAGNOSIS — M25532 Pain in left wrist: Secondary | ICD-10-CM | POA: Diagnosis not present

## 2023-04-02 DIAGNOSIS — Z6841 Body Mass Index (BMI) 40.0 and over, adult: Secondary | ICD-10-CM

## 2023-04-02 DIAGNOSIS — E669 Obesity, unspecified: Secondary | ICD-10-CM

## 2023-04-02 DIAGNOSIS — Z7985 Long-term (current) use of injectable non-insulin antidiabetic drugs: Secondary | ICD-10-CM

## 2023-04-02 DIAGNOSIS — Z7984 Long term (current) use of oral hypoglycemic drugs: Secondary | ICD-10-CM

## 2023-04-02 DIAGNOSIS — K5900 Constipation, unspecified: Secondary | ICD-10-CM | POA: Diagnosis not present

## 2023-04-02 NOTE — Progress Notes (Signed)
.[facility]  Office: [phone]  /  Fax: [phone]  WEIGHT SUMMARY AND BIOMETRICS  Vitals Temp: 98.1 F (36.7 C) BP: 100/65 Pulse Rate: 94 SpO2: 93 %   Anthropometric Measurements Height: 5\' 2"  (1.575 m) Weight: 234 lb (106.1 kg) BMI (Calculated): 42.79 Weight at Last Visit: 234 lb Weight Lost Since Last Visit: 0 Weight Gained Since Last Visit: 0 Starting Weight: 241 lb Total Weight Loss (lbs): 7 lb (3.175 kg) Peak Weight: 250 lb   Body Composition  Body Fat %: 48.1 % Fat Mass (lbs): 113 lbs Muscle Mass (lbs): 115.6 lbs Total Body Water (lbs): 79.8 lbs Visceral Fat Rating : 17   Other Clinical Data Fasting: no Labs: no Today's Visit #: 6 Starting Date: 11/27/22     HPI  Chief Complaint: OBESITY  Leah Lawson is here to discuss her progress with her obesity treatment plan. She is on the the Category 3 Plan and states she is following her eating plan approximately 80 % of the time. She states she is exercising walking 30  minutes 5 times per week.   Interval History:  Since last office visit she has maintained.  Discussed the use of AI scribe software for clinical note transcription with the patient, who gave verbal consent to proceed.  History of Present Illness     The patient, a 67 year old female with a history of type 2 diabetes, chronic kidney disease, hyperlipidemia, and obesity, presents for a follow-up visit regarding her obesity treatment plan. She reports adherence to the prescribed plan but expresses frustration over the lack of progress in weight loss. She has been on Mounjaro 7.5 mg weekly and Farxiga 5 mg daily for her diabetes management. She recently recovered from a COVID-19 infection. She also reports difficulty with her left wrist following a closed fracture of the left radius, which has affected her ability to administer her Mounjaro injections.  Pharmacotherapy: Mounjaro 7.5 mg weekly for Type 2 diabetes just started this week. . Denies mass in  neck, dysphagia, dyspepsia, persistent hoarseness, abdominal pain, or N/V or diarrhea. Has annual eye exam and being followed more closely for glaucoma- Has follow up with Ophthalmology next month to recheck pressure.  Mood is stable.  Reports constipation- having daily BM, but hard stools.  Discussing trying miralax daily for constipation management.  TREATMENT PLAN FOR OBESITY:  Recommended Dietary Goals  Tyja is currently in the action stage of change. As such, her goal is to continue weight management plan. She has agreed to change to the Category 2 Plan as Category 3 plan may not promote gradual weight loss as IC showed REE of 1800. Encouraged the patient not to loose hope/sight of goals for better health and wellness.   Behavioral Intervention  We discussed the following Behavioral Modification Strategies today: increasing lean protein intake, decreasing simple carbohydrates , increasing vegetables, increasing lower glycemic fruits, increasing fiber rich foods, increasing water intake, work on tracking and journaling calories using tracking application, emotional eating strategies and understanding the difference between hunger signals and cravings, continue to practice mindfulness when eating, and planning for success.  Additional resources provided today: NA  Recommended Physical Activity Goals  Marlise has been advised to work up to 150 minutes of moderate intensity aerobic activity a week and strengthening exercises 2-3 times per week for cardiovascular health, weight loss maintenance and preservation of muscle mass.   She has agreed to Continue current level of physical activity  and Think about ways to increase daily physical activity and overcoming barriers  to exercise   Pharmacotherapy We discussed various medication options to help Kecia with her weight loss efforts and we both agreed to Continue Mounjaro 7.5 mg weekly for management of Type 2 diabetes.    Return in  about 3 weeks (around 04/23/2023).[name] She was informed of the importance of frequent follow up visits to maximize her success with intensive lifestyle modifications for her multiple health conditions.  PHYSICAL EXAM:  Blood pressure 100/65, pulse 94, temperature 98.1 F (36.7 C), height 5\' 2"  (1.575 m), weight 234 lb (106.1 kg), SpO2 93%. Body mass index is 42.8 kg/m.  General: She is overweight, cooperative, alert, well developed, and in no acute distress. PSYCH: Has normal mood, affect and thought process.   Cardiovascular: HR 90's- monitor on Mounjaro. BP 100/65 Lungs: Normal breathing effort, no conversational dyspnea.  DIAGNOSTIC DATA REVIEWED:  BMET    Component Value Date/Time   NA 141 03/04/2023 1014   NA 140 11/28/2022 1507   K 4.2 03/04/2023 1014   CL 106 03/04/2023 1014   CO2 28 03/04/2023 1014   GLUCOSE 111 (H) 03/04/2023 1014   BUN 25 (H) 03/04/2023 1014   BUN 20 11/28/2022 1507   CREATININE 0.94 03/04/2023 1014   CALCIUM 9.7 03/04/2023 1014   GFRNONAA >60 03/04/2023 1014   GFRAA >60 11/30/2019 0658   Lab Results  Component Value Date   HGBA1C 7.5 (H) 11/28/2022   HGBA1C 7.2 (H) 11/26/2019   Lab Results  Component Value Date   INSULIN 127.0 (H) 11/28/2022   Lab Results  Component Value Date   TSH 0.454 11/28/2022   CBC    Component Value Date/Time   WBC 6.4 03/04/2023 1014   WBC 5.8 08/14/2022 1051   RBC 5.04 03/04/2023 1014   HGB 15.6 (H) 03/04/2023 1014   HGB 16.9 (H) 11/28/2022 1507   HCT 45.0 03/04/2023 1014   HCT 50.4 (H) 11/28/2022 1507   PLT 286 03/04/2023 1014   PLT 332 11/28/2022 1507   MCV 89.3 03/04/2023 1014   MCV 92 11/28/2022 1507   MCH 31.0 03/04/2023 1014   MCHC 34.7 03/04/2023 1014   RDW 13.2 03/04/2023 1014   RDW 12.7 11/28/2022 1507   Iron Studies    Component Value Date/Time   IRON 62 01/31/2022 1114   TIBC 350 01/31/2022 1114   FERRITIN 25 01/31/2022 1114   IRONPCTSAT 18 01/31/2022 1114   Lipid Panel      Component Value Date/Time   CHOL 179 11/28/2022 1507   TRIG 183 (H) 11/28/2022 1507   HDL 39 (L) 11/28/2022 1507   CHOLHDL 4.6 (H) 11/28/2022 1507   CHOLHDL 5 08/14/2022 1051   VLDL 23.0 08/14/2022 1051   LDLCALC 108 (H) 11/28/2022 1507   Hepatic Function Panel     Component Value Date/Time   PROT 6.9 03/04/2023 1014   PROT 7.1 11/28/2022 1507   ALBUMIN 4.1 03/04/2023 1014   ALBUMIN 4.3 11/28/2022 1507   AST 14 (L) 03/04/2023 1014   ALT 14 03/04/2023 1014   ALKPHOS 73 03/04/2023 1014   BILITOT 0.4 03/04/2023 1014      Component Value Date/Time   TSH 0.454 11/28/2022 1507   Nutritional Lab Results  Component Value Date   VD25OH 35.1 11/28/2022   VD25OH 19.68 (L) 08/14/2022   VD25OH 45.35 01/31/2022    ASSOCIATED CONDITIONS ADDRESSED TODAY  ASSESSMENT AND PLAN  Problem List Items Addressed This Visit     Type 2 diabetes mellitus with obesity (HCC)  Obesity and Type  2 Diabetes Patient is on Mounjaro 7.5 mg weekly( just started 7.5 mg earlier this week) and Farxiga 5 mg daily.  Patient is adhering to diet and exercise plan but is frustrated with lack of weight loss. She reports very good adherence to nutrition plan at 80 % . We had discussed journaling, but she has not been recording the items but feels she has been very compliant. We discussed changing to Category 2 plan which should promote gradual weight loss based on her REE in April at 1800 calories.  -Continue Mounjaro 7.5 mg weekly and Farxiga 5 mg daily. -Encourage patient to continue with diet and exercise plan. -Review patient's progress in 2-3 weeks. Plan to recheck labs over the next 1-2 months for follow up of A1C.   Constipation The patient has been experiencing constipation, which she describes as hard and painful bowel movements. She typically has a bowel movement once daily, but this pattern has been disrupted recently. She has tried various remedies, including herbal tea, but has not found consistent  relief. She also mentions that she has been walking regularly for exercise and has been following a diet plan that includes protein and fiber. Despite these efforts, she has not noticed significant weight loss. -Recommend trial of over-the-counter Miralax or equivalent 17 grams daily in fluid.  -Encourage patient to continue drinking plenty of fluids.  Closed Fracture of Left Radius Patient reports difficulty with administering Mounjaro due to limited use of left hand. -Continue to monitor healing and function of left hand and following up with Hand surgery as directed.   COVID-19 Patient has recently recovered from infection. No ongoing concerns discussed in this visit. -Continue to monitor for any post-infection complications.  ATTESTASTION STATEMENTS:  Reviewed by clinician on day of visit: allergies, medications, problem list, medical history, surgical history, family history, social history, and previous encounter notes.   I have personally spent 40 minutes total time today in preparation, patient care, nutritional counseling and documentation for this visit, including the following: review of clinical lab tests; review of medical tests/procedures/services.      Raylynn Hersh, PA-C

## 2023-04-03 ENCOUNTER — Other Ambulatory Visit (INDEPENDENT_AMBULATORY_CARE_PROVIDER_SITE_OTHER): Payer: Self-pay | Admitting: Physician Assistant

## 2023-04-03 DIAGNOSIS — E1169 Type 2 diabetes mellitus with other specified complication: Secondary | ICD-10-CM

## 2023-04-11 ENCOUNTER — Encounter: Payer: Self-pay | Admitting: Family Medicine

## 2023-04-15 ENCOUNTER — Telehealth: Payer: Self-pay | Admitting: Family Medicine

## 2023-04-15 NOTE — Telephone Encounter (Signed)
Handicap form placed on providers desk for completion

## 2023-04-15 NOTE — Telephone Encounter (Signed)
Patient called and needs a a handicapp placard form renewed. Patient states she does not know of any form as when she came last time doctor filled one out and signed it for her . Please call for further info.

## 2023-04-16 ENCOUNTER — Encounter (INDEPENDENT_AMBULATORY_CARE_PROVIDER_SITE_OTHER): Payer: Self-pay | Admitting: Adult Health

## 2023-04-16 ENCOUNTER — Ambulatory Visit (INDEPENDENT_AMBULATORY_CARE_PROVIDER_SITE_OTHER): Payer: Medicare Other | Admitting: Adult Health

## 2023-04-16 VITALS — BP 97/69 | HR 90 | Temp 98.1°F | Ht 62.0 in | Wt 231.0 lb

## 2023-04-16 DIAGNOSIS — Z6841 Body Mass Index (BMI) 40.0 and over, adult: Secondary | ICD-10-CM

## 2023-04-16 DIAGNOSIS — E1169 Type 2 diabetes mellitus with other specified complication: Secondary | ICD-10-CM | POA: Diagnosis not present

## 2023-04-16 DIAGNOSIS — E669 Obesity, unspecified: Secondary | ICD-10-CM | POA: Diagnosis not present

## 2023-04-16 DIAGNOSIS — Z7985 Long-term (current) use of injectable non-insulin antidiabetic drugs: Secondary | ICD-10-CM

## 2023-04-16 DIAGNOSIS — E785 Hyperlipidemia, unspecified: Secondary | ICD-10-CM

## 2023-04-16 MED ORDER — TIRZEPATIDE 10 MG/0.5ML ~~LOC~~ SOAJ: 10.0000 mg | SUBCUTANEOUS | 0 refills | Status: DC

## 2023-04-16 NOTE — Telephone Encounter (Signed)
LM for pt she will need to schedule ov per Vickie since she has not been seen since May and will need to have visit for documentation purposes

## 2023-04-16 NOTE — Progress Notes (Signed)
WEIGHT SUMMARY AND BIOMETRICS  Vitals Temp: 98.1 F (36.7 C) BP: 97/69 Pulse Rate: 90 SpO2: 96 %   Anthropometric Measurements Height: 5\' 2"  (1.575 m) Weight: 231 lb (104.8 kg) BMI (Calculated): 42.24 Weight at Last Visit: 234lb Weight Lost Since Last Visit: 3lb Weight Gained Since Last Visit: 0 Starting Weight: 241lb Total Weight Loss (lbs): 10 lb (4.536 kg) Peak Weight: 250lb   Body Composition  Body Fat %: 47.5 % Fat Mass (lbs): 109.8 lbs Muscle Mass (lbs): 115 lbs Total Body Water (lbs): 77.4 lbs Visceral Fat Rating : 17   Other Clinical Data Fasting: no Labs: no Today's Visit #: 7 Starting Date: 11/27/22    Chief Complaint:   OBESITY Leah Lawson is here to discuss her progress with her obesity treatment plan. She is on the the Category 2 Plan and states she is following her eating plan approximately 85 % of the time. She states she is exercising Walking 20-30 minutes 5 times per week.   Interim History:  Last OV at [facility] on 04/02/2023- Category 2 Plan as Category 3 plan may not promote gradual weight loss as IC showed REE of 1800.   Hunger/appetite-she reports persistent polyphagia- even after consuming meal on prescribed meal plan.  Exercise-she is walking several times per week.   Subjective:   1. Type 2 diabetes mellitus with obesity (HCC) Lab Results  Component Value Date   HGBA1C 7.5 (H) 11/28/2022   HGBA1C 7.7 (H) 08/14/2022   HGBA1C 7.3 03/13/2022    Currently on weekly Mounjaro 7.5 mg injection She has had 3 injections on this strength. Denies mass in neck, dysphagia, dyspepsia, persistent hoarseness, abdominal pain, or N/V/C   2. Hyperlipidemia associated with type 2 diabetes mellitus (HCC) Lipid Panel     Component Value Date/Time   CHOL 179 11/28/2022 1507   TRIG 183 (H) 11/28/2022 1507   HDL 39 (L) 11/28/2022 1507   CHOLHDL 4.6 (H) 11/28/2022 1507   CHOLHDL 5 08/14/2022 1051   VLDL 23.0 08/14/2022 1051   LDLCALC 108 (H)  11/28/2022 1507   LABVLDL 32 11/28/2022 1507    Cards/Dr. Duke Salvia manages statin therapy- Crestor 10mg  M/W/F  Per pt- SHE STOPPED Crestor > 8 weeks ago, re: significant, diffuse myalgia  She was unable to walk when on Crestor due to pain  Assessment/Plan:   1. Type 2 diabetes mellitus with obesity (HCC) Take final Mounjaro 7.5mg  injection then increase  Refill and Increase tirzepatide (MOUNJARO) 10 MG/0.5ML Pen Inject 10 mg into the skin once a week. Dispense: 6 mL, Refills: 0 ordered    2. Hyperlipidemia associated with type 2 diabetes mellitus (HCC) STRONGLY RECOMMEND RE-STARTING STATIN THERAPY If unable to tolerate, then please f/u with Cards to discuss other treatment options, OA:[id number]  3. BMI 40.0-44.9, adult (HCC) Current BMI 42.24  Leah Lawson is currently in the action stage of change. As such, her goal is to continue with weight loss efforts. She has agreed to the Category 2 Plan.   Exercise goals: Older adults should follow the adult guidelines. When older adults cannot meet the adult guidelines, they should be as physically active as their abilities and conditions will allow.  Older adults should do exercises that maintain or improve balance if they are at risk of falling.  Older adults should determine their level of effort for physical activity relative to their level of fitness.  Older adults with chronic conditions should understand whether and how their conditions affect their ability to do regular physical  activity safely.  Behavioral modification strategies: increasing lean protein intake, decreasing simple carbohydrates, increasing vegetables, increasing water intake, meal planning and cooking strategies, keeping healthy foods in the home, better snacking choices, emotional eating strategies, and planning for success.  Leah Lawson has agreed to follow-up with our clinic in 3 weeks. She was informed of the importance of frequent follow-up visits to maximize her success with  intensive lifestyle modifications for her multiple health conditions.   Objective:   Blood pressure 97/69, pulse 90, temperature 98.1 F (36.7 C), height 5\' 2"  (1.575 m), weight 231 lb (104.8 kg), SpO2 96%. Body mass index is 42.25 kg/m.  General: Cooperative, alert, well developed, in no acute distress. HEENT: Conjunctivae and lids unremarkable. Cardiovascular: Regular rhythm.  Lungs: Normal work of breathing. Neurologic: No focal deficits.   Lab Results  Component Value Date   CREATININE 0.94 03/04/2023   BUN 25 (H) 03/04/2023   NA 141 03/04/2023   K 4.2 03/04/2023   CL 106 03/04/2023   CO2 28 03/04/2023   Lab Results  Component Value Date   ALT 14 03/04/2023   AST 14 (L) 03/04/2023   ALKPHOS 73 03/04/2023   BILITOT 0.4 03/04/2023   Lab Results  Component Value Date   HGBA1C 7.5 (H) 11/28/2022   HGBA1C 7.7 (H) 08/14/2022   HGBA1C 7.3 03/13/2022   HGBA1C 7.5 (H) 01/31/2022   HGBA1C 7.4 (H) 10/18/2021   Lab Results  Component Value Date   INSULIN 127.0 (H) 11/28/2022   Lab Results  Component Value Date   TSH 0.454 11/28/2022   Lab Results  Component Value Date   CHOL 179 11/28/2022   HDL 39 (L) 11/28/2022   LDLCALC 108 (H) 11/28/2022   TRIG 183 (H) 11/28/2022   CHOLHDL 4.6 (H) 11/28/2022   Lab Results  Component Value Date   VD25OH 35.1 11/28/2022   VD25OH 19.68 (L) 08/14/2022   VD25OH 45.35 01/31/2022   Lab Results  Component Value Date   WBC 6.4 03/04/2023   HGB 15.6 (H) 03/04/2023   HCT 45.0 03/04/2023   MCV 89.3 03/04/2023   PLT 286 03/04/2023   Lab Results  Component Value Date   IRON 62 01/31/2022   TIBC 350 01/31/2022   FERRITIN 25 01/31/2022    Attestation Statements:   Reviewed by clinician on day of visit: allergies, medications, problem list, medical history, surgical history, family history, social history, and previous encounter notes.  I have reviewed the above documentation for accuracy and completeness, and I agree with  the above. -  Kristopher Delk d. Yanilen Adamik, NP-C

## 2023-04-18 ENCOUNTER — Other Ambulatory Visit: Payer: Self-pay | Admitting: Adult Health

## 2023-04-28 ENCOUNTER — Ambulatory Visit
Admission: RE | Admit: 2023-04-28 | Discharge: 2023-04-28 | Disposition: A | Discharge status: Home / Self Care | Payer: Medicare Other | Source: Ambulatory Visit | Attending: Family Medicine | Admitting: Family Medicine

## 2023-04-28 DIAGNOSIS — Z87891 Personal history of nicotine dependence: Secondary | ICD-10-CM

## 2023-04-28 DIAGNOSIS — R911 Solitary pulmonary nodule: Secondary | ICD-10-CM

## 2023-04-30 ENCOUNTER — Encounter: Payer: Self-pay | Admitting: Family Medicine

## 2023-04-30 ENCOUNTER — Ambulatory Visit: Payer: Medicare Other | Admitting: Family Medicine

## 2023-04-30 VITALS — BP 126/72 | HR 100 | Temp 97.6°F | Ht 62.0 in | Wt 230.0 lb

## 2023-04-30 DIAGNOSIS — G4733 Obstructive sleep apnea (adult) (pediatric): Secondary | ICD-10-CM | POA: Diagnosis not present

## 2023-04-30 DIAGNOSIS — E785 Hyperlipidemia, unspecified: Secondary | ICD-10-CM

## 2023-04-30 DIAGNOSIS — J432 Centrilobular emphysema: Secondary | ICD-10-CM

## 2023-04-30 DIAGNOSIS — Z7985 Long-term (current) use of injectable non-insulin antidiabetic drugs: Secondary | ICD-10-CM

## 2023-04-30 DIAGNOSIS — I10 Essential (primary) hypertension: Secondary | ICD-10-CM

## 2023-04-30 DIAGNOSIS — E669 Obesity, unspecified: Secondary | ICD-10-CM

## 2023-04-30 DIAGNOSIS — I700 Atherosclerosis of aorta: Secondary | ICD-10-CM

## 2023-04-30 DIAGNOSIS — E1169 Type 2 diabetes mellitus with other specified complication: Secondary | ICD-10-CM

## 2023-04-30 LAB — LIPID PANEL
Cholesterol: 175 mg/dL (ref 0–200)
HDL: 37.8 mg/dL — ABNORMAL LOW (ref >39.00)
LDL Cholesterol: 114 mg/dL — ABNORMAL HIGH (ref 0–99)
NonHDL: 137.14
Total CHOL/HDL Ratio: 5
Triglycerides: 115 mg/dL (ref 0.0–149.0)
VLDL: 23 mg/dL (ref 0.0–40.0)

## 2023-04-30 LAB — BASIC METABOLIC PANEL
BUN: 29 mg/dL — ABNORMAL HIGH (ref 6–23)
CO2: 27 mEq/L (ref 19–32)
Calcium: 10.2 mg/dL (ref 8.4–10.5)
Chloride: 100 mEq/L (ref 96–112)
Creatinine, Ser: 0.98 mg/dL (ref 0.40–1.20)
GFR: 59.46 mL/min — ABNORMAL LOW (ref >60.00)
Glucose, Bld: 91 mg/dL (ref 70–99)
Potassium: 4 mEq/L (ref 3.5–5.1)
Sodium: 136 mEq/L (ref 135–145)

## 2023-04-30 LAB — HEMOGLOBIN A1C: Hgb A1c MFr Bld: 6.1 % (ref 4.6–6.5)

## 2023-04-30 NOTE — Assessment & Plan Note (Signed)
Continue medication and low sodium diet.

## 2023-04-30 NOTE — Assessment & Plan Note (Signed)
She stopped statin therapy due to myalgias. Sees cardiology.

## 2023-04-30 NOTE — Assessment & Plan Note (Signed)
She is on Mounjaro. Seeing [facility]. Check A1c and follow up.

## 2023-04-30 NOTE — Assessment & Plan Note (Signed)
Stopped statin due to myalgias. Follow up as recommended with Dr. Duke Salvia. Check lipids.

## 2023-04-30 NOTE — Progress Notes (Signed)
Subjective:     Patient ID: Leah Lawson, female    DOB: 1955/10/31, 67 y.o.   MRN: [id number]  Chief Complaint  Patient presents with   Form Completion    Handicap form    HPI  Discussed the use of AI scribe software for clinical note transcription with the patient, who gave verbal consent to proceed.  History of Present Illness         Patient is in today for follow up on multiple chronic health conditions including DM.  She is now seeing [facility]. Recent labs done.    Other providers: Pulmonologist - Rubye Oaks, NP Cardiologist- Dr. Chilton Si  GI Hematologist- Georga Kaufmann for polycythemia  [facility]   Requests handicap placard.   Is not taking a statin due to myalgias.   Health Maintenance Due  Topic Date Due   DTaP/Tdap/Td (2 - Td or Tdap) 08/20/2017   Pneumonia Vaccine 18+ Years old (2 of 2 - PCV) 05/11/2019   OPHTHALMOLOGY EXAM  02/20/2023    Past Medical History:  Diagnosis Date   Arthritis    Asthma    Back pain    Cataract    Chronic kidney disease    Closed fracture of distal end of left radius 11/15/2022   Constipation    COPD (chronic obstructive pulmonary disease) (HCC)    COPD (chronic obstructive pulmonary disease) (HCC)    Diabetes (HCC)    Diabetes mellitus without complication (HCC)    "pre-diabetic"   Emphysema of lung (HCC)    Glaucoma    Heartburn    High cholesterol    Hypertension    Hypertensive retinopathy 10/26/2021   Lactose intolerance    Open-angle glaucoma 10/26/2021   Other specified disorders of thyroid    Sleep apnea    SOB (shortness of breath)    Vitamin D deficiency     Past Surgical History:  Procedure Laterality Date   BREAST SURGERY      Family History  Problem Relation Age of Onset   Heart failure Mother    Arthritis Mother    Diabetes Mother    High blood pressure Mother    Colon polyps Father    Colon cancer Father    Cancer Father        prostate, lung   COPD Father    High  Cholesterol Father    High blood pressure Father    Dementia Father    Diabetes Brother    High blood pressure Brother    Stroke Brother    Cancer Maternal Grandfather    Miscarriages / Stillbirths Daughter    Breast cancer Cousin    Esophageal cancer Neg Hx    Liver disease Neg Hx    Rectal cancer Neg Hx    Stomach cancer Neg Hx     Social History   Socioeconomic History   Marital status: Single    Spouse name: Not on file   Number of children: 4   Years of education: Not on file   Highest education level: Master's degree (e.g., MA, MS, MEng, MEd, MSW, MBA)  Occupational History   Occupation: retired  Tobacco Use   Smoking status: Former    Current packs/day: 0.00    Average packs/day: 0.8 packs/day for 46.0 years (34.5 ttl pk-yrs)    Types: Cigarettes    Start date: 05/05/1972    Quit date: 05/05/2018    Years since quitting: 4.9    Passive exposure: Past  Smokeless tobacco: Never  Vaping Use   Vaping status: Never Used  Substance and Sexual Activity   Alcohol use: Not Currently   Drug use: Never   Sexual activity: Not on file  Other Topics Concern   Not on file  Social History Narrative   Not on file   Social Determinants of Health   Financial Resource Strain: Low Risk  (12/16/2022)   Overall Financial Resource Strain (CARDIA)    Difficulty of Paying Living Expenses: Not hard at all  Food Insecurity: No Food Insecurity (12/16/2022)   Hunger Vital Sign    Worried About Running Out of Food in the Last Year: Never true    Ran Out of Food in the Last Year: Never true  Transportation Needs: No Transportation Needs (12/16/2022)   PRAPARE - Administrator, Civil Service (Medical): No    Lack of Transportation (Non-Medical): No  Physical Activity: Insufficiently Active (12/16/2022)   Exercise Vital Sign    Days of Exercise per Week: 4 days    Minutes of Exercise per Session: 20 min  Stress: No Stress Concern Present (12/16/2022)   [employer] of  Occupational Health - Occupational Stress Questionnaire    Feeling of Stress : Not at all  Social Connections: Moderately Integrated (12/16/2022)   Social Connection and Isolation Panel [NHANES]    Frequency of Communication with Friends and Family: More than three times a week    Frequency of Social Gatherings with Friends and Family: Twice a week    Attends Religious Services: More than 4 times per year    Active Member of [employer] or Organizations: Yes    Attends Engineer, structural: More than 4 times per year    Marital Status: Never married  Catering manager Violence: Not on file    Outpatient Medications Prior to Visit  Medication Sig Dispense Refill   albuterol (VENTOLIN HFA) 108 (90 Base) MCG/ACT inhaler INHALE 2 PUFFS BY MOUTH EVERY 6 HOURS AS NEEDED FOR WHEEZING AND FOR SHORTNESS OF BREATH 18 g 1   clotrimazole-betamethasone (LOTRISONE) cream Apply 1 Application topically daily. 30 g 0   FARXIGA 5 MG TABS tablet TAKE 1 TABLET BY MOUTH ONCE DAILY BEFORE BREAKFAST 90 tablet 0   losartan-hydrochlorothiazide (HYZAAR) 100-25 MG tablet Take 1 tablet by mouth once daily 90 tablet 1   omeprazole (PRILOSEC) 20 MG capsule Take 1 capsule (20 mg total) by mouth daily. 90 capsule 3   oxyCODONE-acetaminophen (PERCOCET) 10-325 MG tablet Take 1 tablet by mouth as needed for pain.     rosuvastatin (CRESTOR) 10 MG tablet TAKE 1 TABLET MONDAY, WEDNESDAY, AND FRIDAY ONLY 40 tablet 3   spironolactone (ALDACTONE) 25 MG tablet Take 1 tablet by mouth once daily 90 tablet 2   tirzepatide (MOUNJARO) 10 MG/0.5ML Pen Inject 10 mg into the skin once a week. 6 mL 0   umeclidinium-vilanterol (ANORO ELLIPTA) 62.5-25 MCG/ACT AEPB Inhale 1 puff by mouth once daily 60 each 5   No facility-administered medications prior to visit.    Allergies  Allergen Reactions   Hydralazine Swelling    In feet and ankle   Lisinopril Cough   Singulair [Montelukast Sodium] Other (See Comments)    headaches   Mucinex  [Guaifenesin Er] Diarrhea and Rash    Review of Systems  Constitutional:  Negative for chills, fever and malaise/fatigue.  HENT:  Positive for congestion.   Respiratory:  Positive for shortness of breath.   Cardiovascular:  Negative for chest pain, palpitations  and leg swelling.  Gastrointestinal:  Negative for abdominal pain, constipation, diarrhea, nausea and vomiting.  Genitourinary:  Negative for dysuria, frequency and urgency.  Neurological:  Negative for dizziness and focal weakness.       Objective:    Physical Exam Constitutional:      General: She is not in acute distress.    Appearance: She is not ill-appearing.  Eyes:     Extraocular Movements: Extraocular movements intact.     Conjunctiva/sclera: Conjunctivae normal.  Cardiovascular:     Rate and Rhythm: Normal rate.  Pulmonary:     Effort: Pulmonary effort is normal.  Musculoskeletal:     Cervical back: Normal range of motion and neck supple.  Skin:    General: Skin is warm and dry.  Neurological:     General: No focal deficit present.     Mental Status: She is alert and oriented to person, place, and time.  Psychiatric:        Mood and Affect: Mood normal.        Behavior: Behavior normal.        Thought Content: Thought content normal.      BP 126/72 (BP Location: Left Arm, Patient Position: Sitting, Cuff Size: Large)   Pulse 100   Temp 97.6 F (36.4 C) (Temporal)   Ht 5\' 2"  (1.575 m)   Wt 230 lb (104.3 kg)   SpO2 95%   BMI 42.07 kg/m  Wt Readings from Last 3 Encounters:  04/30/23 230 lb (104.3 kg)  04/16/23 231 lb (104.8 kg)  04/02/23 234 lb (106.1 kg)       Assessment & Plan:   Problem List Items Addressed This Visit       Cardiovascular and Mediastinum   Aortic atherosclerosis (HCC)    She stopped statin therapy due to myalgias. Sees cardiology.       Essential hypertension    Continue medication and low sodium diet.       Relevant Orders   Basic metabolic panel (Completed)      Respiratory   OSA on CPAP   Pulmonary emphysema (HCC)    Followed by pulmonology.         Endocrine   Hyperlipidemia associated with type 2 diabetes mellitus (HCC)    Stopped statin due to myalgias. Follow up as recommended with Dr. Duke Salvia. Check lipids.       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 Seeing [facility]. Check A1c and follow up.       Relevant Orders   Hemoglobin A1c (Completed)   Basic metabolic panel (Completed)   Handicap placard filled out for 6 months.   I am having Faiga Dearing maintain her omeprazole, oxyCODONE-acetaminophen, rosuvastatin, clotrimazole-betamethasone, losartan-hydrochlorothiazide, spironolactone, Farxiga, albuterol, tirzepatide, and Anoro Ellipta.  No orders of the defined types were placed in this encounter.

## 2023-04-30 NOTE — Assessment & Plan Note (Signed)
Followed by pulmonology

## 2023-05-07 ENCOUNTER — Encounter (INDEPENDENT_AMBULATORY_CARE_PROVIDER_SITE_OTHER): Payer: Self-pay | Admitting: Physician Assistant

## 2023-05-07 ENCOUNTER — Ambulatory Visit (INDEPENDENT_AMBULATORY_CARE_PROVIDER_SITE_OTHER): Payer: Medicare Other | Admitting: Physician Assistant

## 2023-05-07 VITALS — BP 91/60 | HR 96 | Temp 98.5°F | Ht 62.0 in | Wt 230.0 lb

## 2023-05-07 DIAGNOSIS — E1169 Type 2 diabetes mellitus with other specified complication: Secondary | ICD-10-CM | POA: Diagnosis not present

## 2023-05-07 DIAGNOSIS — Z6841 Body Mass Index (BMI) 40.0 and over, adult: Secondary | ICD-10-CM

## 2023-05-07 DIAGNOSIS — Z7985 Long-term (current) use of injectable non-insulin antidiabetic drugs: Secondary | ICD-10-CM

## 2023-05-07 DIAGNOSIS — E785 Hyperlipidemia, unspecified: Secondary | ICD-10-CM | POA: Diagnosis not present

## 2023-05-07 DIAGNOSIS — Z5320 Procedure and treatment not carried out because of patient's decision for unspecified reasons: Secondary | ICD-10-CM

## 2023-05-07 DIAGNOSIS — K5903 Drug induced constipation: Secondary | ICD-10-CM

## 2023-05-07 MED ORDER — TIRZEPATIDE 10 MG/0.5ML ~~LOC~~ SOAJ: 10.0000 mg | SUBCUTANEOUS | 0 refills | Status: DC

## 2023-05-07 NOTE — Progress Notes (Signed)
.[facility]  Office: [phone]  /  Fax: [phone]  WEIGHT SUMMARY AND BIOMETRICS  Vitals Temp: 98.5 F (36.9 C) BP: 91/60 Pulse Rate: 96 SpO2: 97 %   Anthropometric Measurements Height: 5\' 2"  (1.575 m) Weight: 230 lb (104.3 kg) BMI (Calculated): 42.06 Weight at Last Visit: 231 lb Weight Lost Since Last Visit: 1 lb Weight Gained Since Last Visit: 0 Starting Weight: 241 lb Total Weight Loss (lbs): 11 lb (4.99 kg) Peak Weight: 250 lb   Body Composition  Body Fat %: 48.3 % Fat Mass (lbs): 111 lbs Muscle Mass (lbs): 113 lbs Total Body Water (lbs): 78.4 lbs Visceral Fat Rating : 17   Other Clinical Data Fasting: no Labs: no Today's Visit #: 8 Starting Date: 11/27/22     HPI  Chief Complaint: OBESITY  Leah Lawson is here to discuss her progress with her obesity treatment plan. She is on the the Category 2 Plan and states she is following her eating plan approximately 90 % of the time. She states she is exercising- walking 30 minutes 5 times per week. Discussed the use of AI scribe software for clinical note transcription with the patient, who gave verbal consent to proceed.  History of Present Illness  /     Interval History:  Since last office visit she is down  1 lbs.  Leah Lawson, a patient with a history of obesity, type 2 diabetes, hyperlipidemia, and vitamin D deficiency, presents for a follow-up visit regarding her obesity treatment plan. She reports an increase in hunger, particularly after meals, which she finds challenging to manage. Despite this, she has made significant dietary changes, including reducing simple carbohydrates and increasing protein intake. She has also increased her physical activity, walking for 35 minutes five days a week.  The patient also reports struggles with constipation, which she manages by increasing her water intake and taking a stool softener mid-week. She has noticed that certain foods, such as collard greens, exacerbate her  constipation, and she has adjusted her diet accordingly.  [name] also mentions that she has been diagnosed with severe carpal tunnel in her left hand and is scheduled for cataract removal in the coming months. She uses a CPAP machine nightly and supplemental oxygen due to a pulmonary condition. She recently had a chest CT scan, the results of which are pending.    Pharmacotherapy: Mounjaro   TREATMENT PLAN FOR OBESITY: Obesity Patient has lost 11 pounds on current regimen. Noted slight decrease in muscle mass. -Continue current nutrition plan and exercise regimen to promote weight loss. -Encourage high protein intake. -Consider adjusting Mounjaro dose if muscle mass continues to decrease. Recommended Dietary Goals  Leah Lawson is currently in the action stage of change. As such, her goal is to continue weight management plan. She has agreed to the Category 2 Plan.  Behavioral Intervention  We discussed the following Behavioral Modification Strategies today: increasing lean protein intake, decreasing simple carbohydrates , increasing vegetables, increasing lower glycemic fruits, increasing fiber rich foods, avoiding skipping meals, increasing water intake, work on meal planning and preparation, decreasing eating out or consumption of processed foods, and making healthy choices when eating convenient foods, emotional eating strategies and understanding the difference between hunger signals and cravings, continue to practice mindfulness when eating, and planning for success.  Additional resources provided today: NA  Recommended Physical Activity Goals  Mandee has been advised to work up to 150 minutes of moderate intensity aerobic activity a week and strengthening exercises 2-3 times per week for cardiovascular  health, weight loss maintenance and preservation of muscle mass.   She has agreed to Continue current level of physical activity  and Increase physical activity in their day and  reduce sedentary time (increase NEAT).   Pharmacotherapy We discussed various medication options to help Leah Lawson with her weight loss efforts and we both agreed to continue Mounjaro 10 mg weekly for Type 2 diabetes.    No follow-ups on file.Leah Lawson She was informed of the importance of frequent follow up visits to maximize her success with intensive lifestyle modifications for her multiple health conditions.  PHYSICAL EXAM:  Blood pressure 91/60, pulse 96, temperature 98.5 F (36.9 C), height 5\' 2"  (1.575 m), weight 230 lb (104.3 kg), SpO2 97%. Body mass index is 42.07 kg/m.  General: She is overweight, cooperative, alert, well developed, and in no acute distress. PSYCH: Has normal mood, affect and thought process.   Cardiovascular: HR 90's BP 91/60- monitor on Mounjaro Lungs: Normal breathing effort, no conversational dyspnea.  DIAGNOSTIC DATA REVIEWED:  BMET    Component Value Date/Time   NA 136 04/30/2023 1423   NA 140 11/28/2022 1507   K 4.0 04/30/2023 1423   CL 100 04/30/2023 1423   CO2 27 04/30/2023 1423   GLUCOSE 91 04/30/2023 1423   BUN 29 (H) 04/30/2023 1423   BUN 20 11/28/2022 1507   CREATININE 0.98 04/30/2023 1423   CREATININE 0.94 03/04/2023 1014   CALCIUM 10.2 04/30/2023 1423   GFRNONAA >60 03/04/2023 1014   GFRAA >60 11/30/2019 0658   Lab Results  Component Value Date   HGBA1C 6.1 04/30/2023   HGBA1C 7.2 (H) 11/26/2019   Lab Results  Component Value Date   INSULIN 127.0 (H) 11/28/2022   Lab Results  Component Value Date   TSH 0.454 11/28/2022   CBC    Component Value Date/Time   WBC 6.4 03/04/2023 1014   WBC 5.8 08/14/2022 1051   RBC 5.04 03/04/2023 1014   HGB 15.6 (H) 03/04/2023 1014   HGB 16.9 (H) 11/28/2022 1507   HCT 45.0 03/04/2023 1014   HCT 50.4 (H) 11/28/2022 1507   PLT 286 03/04/2023 1014   PLT 332 11/28/2022 1507   MCV 89.3 03/04/2023 1014   MCV 92 11/28/2022 1507   MCH 31.0 03/04/2023 1014   MCHC 34.7 03/04/2023 1014   RDW 13.2  03/04/2023 1014   RDW 12.7 11/28/2022 1507   Iron Studies    Component Value Date/Time   IRON 62 01/31/2022 1114   TIBC 350 01/31/2022 1114   FERRITIN 25 01/31/2022 1114   IRONPCTSAT 18 01/31/2022 1114   Lipid Panel     Component Value Date/Time   CHOL 175 04/30/2023 1423   CHOL 179 11/28/2022 1507   TRIG 115.0 04/30/2023 1423   HDL 37.80 (L) 04/30/2023 1423   HDL 39 (L) 11/28/2022 1507   CHOLHDL 5 04/30/2023 1423   VLDL 23.0 04/30/2023 1423   LDLCALC 114 (H) 04/30/2023 1423   LDLCALC 108 (H) 11/28/2022 1507   Hepatic Function Panel     Component Value Date/Time   PROT 6.9 03/04/2023 1014   PROT 7.1 11/28/2022 1507   ALBUMIN 4.1 03/04/2023 1014   ALBUMIN 4.3 11/28/2022 1507   AST 14 (L) 03/04/2023 1014   ALT 14 03/04/2023 1014   ALKPHOS 73 03/04/2023 1014   BILITOT 0.4 03/04/2023 1014      Component Value Date/Time   TSH 0.454 11/28/2022 1507   Nutritional Lab Results  Component Value Date   VD25OH 35.1 11/28/2022  VD25OH 19.68 (L) 08/14/2022   VD25OH 45.35 01/31/2022    ASSOCIATED CONDITIONS ADDRESSED TODAY  ASSESSMENT AND PLAN  Problem List Items Addressed This Visit     Morbid obesity (HCC) with starting BMI 44   Vitamin D deficiency   Hyperlipidemia associated with type 2 diabetes mellitus (HCC)   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 Patient reports increased hunger after meals. Recently increased dose of Mounjaro to 10 mg over the past week. Advised will monitor response/hunger on increased Mounjaro over the next month before making further adjustments. Discussed focusing on protein intake to help with excess hunger signals and increase fiber. . -Continue/Refill Mounjaro 10mg  weekly.  -Encourage high protein diet to control hunger. She is working  on nutrition plan to decrease simple carbohydrates, increase lean proteins and exercise to promote  weight loss and improve glycemic control .   Hyperlipidemia Patient reports muscle aches with Crestor and has declined statin therapy. . -Discuss alternative lipid-lowering medications with primary care provider and with cardiology- Dr. Duke Salvia as discussed in past. Patient is adamant that she will not resume statin medication. .   Constipation Likely due to GLP-1 medication.  Patient reports improvement with increased water intake and occasional use of stool softener (likely Colace). -Continue current management strategy. Continue to get adequate hydration- at least 80 oz daily of fluids -Consider adding other fiber-rich foods to diet.  Other concerns of patient:  Cataract Patient scheduled for cataract removal in December and January. -Continue current management with ophthalmologist.  ?Pulmonary Nodule Patient had a follow-up Chest CT scan, awaiting results. -Advise patient to follow up with pulmonologist for results. Patient provided with phone number for pulmonologist.   Follow-up in 4 weeks on June 04, 2023. ATTESTASTION STATEMENTS:  Reviewed by clinician on day of visit: allergies, medications, problem list, medical history, surgical history, family history, social history, and previous encounter notes.   I have personally spent 50 minutes total time today in preparation, patient care, nutritional counseling and documentation for this visit, including the following: review of clinical lab tests; review of medical tests/procedures/services.      Edilson Vital, PA-C

## 2023-05-08 ENCOUNTER — Other Ambulatory Visit: Payer: Self-pay | Admitting: Family Medicine

## 2023-05-08 DIAGNOSIS — Z1231 Encounter for screening mammogram for malignant neoplasm of breast: Secondary | ICD-10-CM

## 2023-05-09 DIAGNOSIS — R52 Pain, unspecified: Secondary | ICD-10-CM | POA: Insufficient documentation

## 2023-05-14 ENCOUNTER — Other Ambulatory Visit: Payer: Self-pay | Admitting: Emergency Medicine

## 2023-05-14 DIAGNOSIS — Z122 Encounter for screening for malignant neoplasm of respiratory organs: Secondary | ICD-10-CM

## 2023-05-14 DIAGNOSIS — Z87891 Personal history of nicotine dependence: Secondary | ICD-10-CM

## 2023-05-21 ENCOUNTER — Other Ambulatory Visit: Payer: Self-pay | Admitting: Pulmonary Disease

## 2023-06-04 ENCOUNTER — Ambulatory Visit (INDEPENDENT_AMBULATORY_CARE_PROVIDER_SITE_OTHER): Payer: Medicare Other | Admitting: Physician Assistant

## 2023-06-04 ENCOUNTER — Encounter (INDEPENDENT_AMBULATORY_CARE_PROVIDER_SITE_OTHER): Payer: Self-pay | Admitting: Physician Assistant

## 2023-06-04 VITALS — BP 101/69 | HR 83 | Temp 98.3°F | Ht 62.0 in | Wt 226.0 lb

## 2023-06-04 DIAGNOSIS — K5903 Drug induced constipation: Secondary | ICD-10-CM | POA: Insufficient documentation

## 2023-06-04 DIAGNOSIS — Z7984 Long term (current) use of oral hypoglycemic drugs: Secondary | ICD-10-CM

## 2023-06-04 DIAGNOSIS — E7800 Pure hypercholesterolemia, unspecified: Secondary | ICD-10-CM

## 2023-06-04 DIAGNOSIS — K5900 Constipation, unspecified: Secondary | ICD-10-CM | POA: Diagnosis not present

## 2023-06-04 DIAGNOSIS — E669 Obesity, unspecified: Secondary | ICD-10-CM

## 2023-06-04 DIAGNOSIS — G4733 Obstructive sleep apnea (adult) (pediatric): Secondary | ICD-10-CM

## 2023-06-04 DIAGNOSIS — Z7985 Long-term (current) use of injectable non-insulin antidiabetic drugs: Secondary | ICD-10-CM

## 2023-06-04 DIAGNOSIS — E1169 Type 2 diabetes mellitus with other specified complication: Secondary | ICD-10-CM

## 2023-06-04 DIAGNOSIS — Z5320 Procedure and treatment not carried out because of patient's decision for unspecified reasons: Secondary | ICD-10-CM

## 2023-06-04 DIAGNOSIS — Z6841 Body Mass Index (BMI) 40.0 and over, adult: Secondary | ICD-10-CM

## 2023-06-04 DIAGNOSIS — E785 Hyperlipidemia, unspecified: Secondary | ICD-10-CM

## 2023-06-04 MED ORDER — TIRZEPATIDE 10 MG/0.5ML ~~LOC~~ SOAJ: 10.0000 mg | SUBCUTANEOUS | 0 refills | Status: DC

## 2023-06-04 NOTE — Progress Notes (Signed)
.smr  Office: [phone]  /  Fax: [phone]  WEIGHT SUMMARY AND BIOMETRICS  Vitals Temp: 98.3 F (36.8 C) BP: 101/69 Pulse Rate: 83 SpO2: 95 %   Anthropometric Measurements Height: 5\' 2"  (1.575 m) Weight: 226 lb (102.5 kg) BMI (Calculated): 41.33 Weight at Last Visit: 230 lb Weight Lost Since Last Visit: 4 lb Weight Gained Since Last Visit: 0 Starting Weight: 241 lb Total Weight Loss (lbs): 15 lb (6.804 kg) Peak Weight: 250 lb   Body Composition  Body Fat %: 47.7 % Fat Mass (lbs): 108 lbs Muscle Mass (lbs): 112.4 lbs Total Body Water (lbs): 77.8 lbs Visceral Fat Rating : 17   Other Clinical Data Fasting: no Labs: no Today's Visit #: 9 Starting Date: 11/27/22     HPI  Chief Complaint: OBESITY  Darlyn is here to discuss her progress with her obesity treatment plan. She is on the the Category 2 Plan and states she is following her eating plan approximately 85 % of the time. She states she is exercising walking 30 minutes 5 times per week.   Interval History:  Since last office visit she is down 4 lbs.  The patient, with a history of obesity, type 2 diabetes, hypertension, and hypercholesterolemia, presents for a follow-up visit. She has not experienced any adverse effects from her medications.   The patient's constipation has improved with increased fiber intake, and she reports regular bowel movements every other day. She has also been maintaining a regular exercise routine of walking 30 minutes for five days a week. She has been mindful of her diet, avoiding artificial sweeteners and high-fat foods, and increasing her protein and fiber intake.  She has lost a total of 15 pounds since starting her treatment plan.  The patient's labs show improvement in her hemoglobin A1c levels, indicating better control of her diabetes. She has not been taking her prescribed rosuvastatin due to previous muscle soreness and weakness.    Pharmacotherapy: Mounjaro 10 mg  weekly.     Denies mass in neck, dysphagia, dyspepsia, persistent hoarseness, abdominal pain, or N/V/Constipation or diarrhea. Has annual eye exam. Mood is stable.   She is going to have cataract surgery in [facility] December.   TREATMENT PLAN FOR OBESITY: Nice progress with weight loss since starting [facility] in June 2024. Down 15 lbs.   TBW loss of 6.22%  Recommended Dietary Goals  Elyanna is currently in the action stage of change. As such, her goal is to continue weight management plan. She has agreed to the Category 2 Plan.  Behavioral Intervention  We discussed the following Behavioral Modification Strategies today: continue to work on maintaining a reduced calorie state, getting the recommended amount of protein, incorporating whole foods, making healthy choices, staying well hydrated and practicing mindfulness when eating..  Additional resources provided today: NA  Recommended Physical Activity Goals  Coco has been advised to work up to 150 minutes of moderate intensity aerobic activity a week and strengthening exercises 2-3 times per week for cardiovascular health, weight loss maintenance and preservation of muscle mass.   She has agreed to Continue current level of physical activity  and Increase physical activity in their day and reduce sedentary time (increase NEAT).   Pharmacotherapy We discussed various medication options to help Allora with her weight loss efforts and we both agreed to continue Mounjaro 10 mg weekly for Type 2 diabetes.    Return in about 4 weeks (around 07/02/2023).[name] She was informed of the importance of frequent follow up visits  to maximize her success with intensive lifestyle modifications for her multiple health conditions.  PHYSICAL EXAM:  Blood pressure 101/69, pulse 83, temperature 98.3 F (36.8 C), height 5\' 2"  (1.575 m), weight 226 lb (102.5 kg), SpO2 95%. Body mass index is 41.34 kg/m.  General: She is overweight, cooperative, alert,  well developed, and in no acute distress. PSYCH: Has normal mood, affect and thought process.   Cardiovascular: HR 80's BP 101/69 Lungs: Normal breathing effort, no conversational dyspnea. Neuro: no focal deficits  DIAGNOSTIC DATA REVIEWED:  BMET    Component Value Date/Time   NA 136 04/30/2023 1423   NA 140 11/28/2022 1507   K 4.0 04/30/2023 1423   CL 100 04/30/2023 1423   CO2 27 04/30/2023 1423   GLUCOSE 91 04/30/2023 1423   BUN 29 (H) 04/30/2023 1423   BUN 20 11/28/2022 1507   CREATININE 0.98 04/30/2023 1423   CREATININE 0.94 03/04/2023 1014   CALCIUM 10.2 04/30/2023 1423   GFRNONAA >60 03/04/2023 1014   GFRAA >60 11/30/2019 0658   Lab Results  Component Value Date   HGBA1C 6.1 04/30/2023   HGBA1C 7.2 (H) 11/26/2019   Lab Results  Component Value Date   INSULIN 127.0 (H) 11/28/2022   Lab Results  Component Value Date   TSH 0.454 11/28/2022   CBC    Component Value Date/Time   WBC 6.4 03/04/2023 1014   WBC 5.8 08/14/2022 1051   RBC 5.04 03/04/2023 1014   HGB 15.6 (H) 03/04/2023 1014   HGB 16.9 (H) 11/28/2022 1507   HCT 45.0 03/04/2023 1014   HCT 50.4 (H) 11/28/2022 1507   PLT 286 03/04/2023 1014   PLT 332 11/28/2022 1507   MCV 89.3 03/04/2023 1014   MCV 92 11/28/2022 1507   MCH 31.0 03/04/2023 1014   MCHC 34.7 03/04/2023 1014   RDW 13.2 03/04/2023 1014   RDW 12.7 11/28/2022 1507   Iron Studies    Component Value Date/Time   IRON 62 01/31/2022 1114   TIBC 350 01/31/2022 1114   FERRITIN 25 01/31/2022 1114   IRONPCTSAT 18 01/31/2022 1114   Lipid Panel     Component Value Date/Time   CHOL 175 04/30/2023 1423   CHOL 179 11/28/2022 1507   TRIG 115.0 04/30/2023 1423   HDL 37.80 (L) 04/30/2023 1423   HDL 39 (L) 11/28/2022 1507   CHOLHDL 5 04/30/2023 1423   VLDL 23.0 04/30/2023 1423   LDLCALC 114 (H) 04/30/2023 1423   LDLCALC 108 (H) 11/28/2022 1507   Hepatic Function Panel     Component Value Date/Time   PROT 6.9 03/04/2023 1014   PROT 7.1  11/28/2022 1507   ALBUMIN 4.1 03/04/2023 1014   ALBUMIN 4.3 11/28/2022 1507   AST 14 (L) 03/04/2023 1014   ALT 14 03/04/2023 1014   ALKPHOS 73 03/04/2023 1014   BILITOT 0.4 03/04/2023 1014      Component Value Date/Time   TSH 0.454 11/28/2022 1507   Nutritional Lab Results  Component Value Date   VD25OH 35.1 11/28/2022   VD25OH 19.68 (L) 08/14/2022   VD25OH 45.35 01/31/2022    ASSOCIATED CONDITIONS ADDRESSED TODAY  ASSESSMENT AND PLAN  Problem List Items Addressed This Visit     Type 2 diabetes mellitus with obesity (HCC)  Type 2 Diabetes Mellitus with other specified complication, without long-term current use of insulin HgbA1c is at goal. Last A1c was 6.1  Medication(s): Mounjaro 10 mg SQ weekly and Farxiga 5 mg daily.   Denies mass in neck, dysphagia,  dyspepsia, persistent hoarseness, abdominal pain, or N/V/Constipation or diarrhea. Has annual eye exam. Mood is stable.  Constipation managed with increased fiber and fluid intake.  Lab Results  Component Value Date   HGBA1C 6.1 04/30/2023   HGBA1C 7.5 (H) 11/28/2022   HGBA1C 7.7 (H) 08/14/2022   Lab Results  Component Value Date   MICROALBUR <0.7 08/14/2022   LDLCALC 114 (H) 04/30/2023   CREATININE 0.98 04/30/2023   Lab Results  Component Value Date   GFR 59.46 (L) 04/30/2023   GFR 69.90 08/14/2022   GFR 81.38 01/31/2022    Plan: Continue and refill Mounjaro 10 mg SQ weekly  Continued weight loss on Mounjaro 10mg  weekly. No reported side effects. Constipation improved with increased fiber intake. Hemoglobin A1c improved from 7.5 to 6.1. Continue working on nutrition plan to decrease simple carbohydrates, increase lean proteins and exercise to promote weight loss and improve glycemic control .   Constipation Karishma notes constipation.   This is likely related to GLP-1.  She has been taking increased fiber in her diet . Constipation is moderately controlled.   Plan: Continue increased fiber and water  intake daily.  Add MiraLAX once daily if worsens.    Hypercholesterolemia Patient reported discontinuation of rosuvastatin due to muscle weakness.  The 10-year ASCVD risk score (Arnett DK, et al., 2019) is: 13.7%  Continue to work on nutrition plan -decreasing simple carbohydrates, increasing lean proteins, decreasing saturated fats and cholesterol , avoiding trans fats and exercise as able to promote weight loss, improve lipids and decrease cardiovascular risks. Continue Mounjaro and [name] for CV risk reduction as patient declines/ is statin intolerant.    OSA using CPAP: Doing well/better with CPAP using nightly for 7-8 hours and feels sleep restorative.  Intensive lifestyle modifications are the first line treatment for this issue. We discussed several lifestyle modifications today and she will continue to work on diet, exercise and weight loss efforts. We will continue to monitor. Orders and follow up as documented in patient record. Educated pt that OSA is a cause of systemic hypertension and is associated with an increased incidence of stroke, heart failure, atrial fibrillation, and coronary heart disease.  Severe OSA increases all-cause mortality and cardiovascular mortality.  -Goal: Treatment of OSA via CPAP compliance and weight loss. Plasma ghrelin levels (appetite or "hunger hormone") are significantly higher in OSA patients than in BMI-matched controls, but decrease to levels similar to those of obese patients without OSA after CPAP treatment.  Weight loss improves OSA by several mechanisms, including reduction in fatty tissue in the throat (i.e. parapharyngeal fat) and the tongue. Loss of abdominal fat increases mediastinal traction on the upper airway making it less likely to collapse during sleep. Studies have also shown that compliance with CPAP treatment improves leptin imbalance. She is hopeful with continued weight loss that she will not need CPAP eventually.    Cataract  Surgery Preparation Surgery scheduled for December. Potential need to pause Mounjaro injections around time of surgery. -Discuss with surgical team regarding Mounjaro management. -Continue current eye drop regimen and rice sock application per Ophthalmology.  General Health Maintenance Continued improvement in sleep quality with consistent use of CPAP machine. Increased physical activity with daily walking. -Continue current sleep and exercise regimen. -Follow-up appointment scheduled for December 3rd at 11:15 AM.  ATTESTASTION STATEMENTS:  Reviewed by clinician on day of visit: allergies, medications, problem list, medical history, surgical history, family history, social history, and previous encounter notes.   I have personally spent 35 minutes  total time today in preparation, patient care, nutritional counseling and documentation for this visit, including the following: review of clinical lab tests; review of medical tests/procedures/services.      Flora Ratz, PA-C

## 2023-06-17 ENCOUNTER — Other Ambulatory Visit: Payer: Self-pay | Admitting: Cardiovascular Disease

## 2023-06-18 ENCOUNTER — Encounter: Payer: Self-pay | Admitting: Family Medicine

## 2023-06-18 ENCOUNTER — Ambulatory Visit: Payer: Medicare Other | Admitting: Family Medicine

## 2023-06-18 ENCOUNTER — Ambulatory Visit
Admission: RE | Admit: 2023-06-18 | Discharge: 2023-06-18 | Disposition: A | Discharge status: Home / Self Care | Payer: Medicare Other | Source: Ambulatory Visit | Attending: Family Medicine | Admitting: Family Medicine

## 2023-06-18 VITALS — BP 126/80 | HR 94 | Temp 97.6°F | Ht 62.0 in | Wt 233.0 lb

## 2023-06-18 DIAGNOSIS — M5136 Other intervertebral disc degeneration, lumbar region with discogenic back pain only: Secondary | ICD-10-CM

## 2023-06-18 DIAGNOSIS — J432 Centrilobular emphysema: Secondary | ICD-10-CM | POA: Diagnosis not present

## 2023-06-18 DIAGNOSIS — E1169 Type 2 diabetes mellitus with other specified complication: Secondary | ICD-10-CM | POA: Diagnosis not present

## 2023-06-18 DIAGNOSIS — M545 Low back pain, unspecified: Secondary | ICD-10-CM | POA: Diagnosis not present

## 2023-06-18 DIAGNOSIS — I700 Atherosclerosis of aorta: Secondary | ICD-10-CM | POA: Diagnosis not present

## 2023-06-18 DIAGNOSIS — Z23 Encounter for immunization: Secondary | ICD-10-CM

## 2023-06-18 DIAGNOSIS — Z1231 Encounter for screening mammogram for malignant neoplasm of breast: Secondary | ICD-10-CM

## 2023-06-18 DIAGNOSIS — Z5320 Procedure and treatment not carried out because of patient's decision for unspecified reasons: Secondary | ICD-10-CM

## 2023-06-18 DIAGNOSIS — E669 Obesity, unspecified: Secondary | ICD-10-CM

## 2023-06-18 DIAGNOSIS — N200 Calculus of kidney: Secondary | ICD-10-CM | POA: Diagnosis not present

## 2023-06-18 DIAGNOSIS — Z7984 Long term (current) use of oral hypoglycemic drugs: Secondary | ICD-10-CM

## 2023-06-18 DIAGNOSIS — K8020 Calculus of gallbladder without cholecystitis without obstruction: Secondary | ICD-10-CM

## 2023-06-18 DIAGNOSIS — E785 Hyperlipidemia, unspecified: Secondary | ICD-10-CM

## 2023-06-18 LAB — POC URINALSYSI DIPSTICK (AUTOMATED)
Bilirubin, UA: NEGATIVE
Blood, UA: NEGATIVE
Glucose, UA: POSITIVE — AB
Ketones, UA: NEGATIVE
Leukocytes, UA: NEGATIVE
Nitrite, UA: NEGATIVE
Protein, UA: NEGATIVE
Spec Grav, UA: 1.025 (ref 1.010–1.025)
Urobilinogen, UA: 0.2 U/dL
pH, UA: 6 (ref 5.0–8.0)

## 2023-06-18 MED ORDER — MELOXICAM 15 MG PO TABS
15.0000 mg | ORAL_TABLET | Freq: Every day | ORAL | 0 refills | Status: DC
Start: 2023-06-18 — End: 2023-07-14

## 2023-06-18 NOTE — Progress Notes (Signed)
Subjective:     Patient ID: Leah Lawson, female    DOB: [birth year], 67 y.o.   MRN: [id number]  Chief Complaint  Patient presents with   Medical Management of Chronic Issues    6 month f/u, lab results showing kidney stones and gallstones. Having back pain    HPI   History of Present Illness         Here for follow up on chronic health conditions and has c/o low back pain DM controlled in September with A1c 6.1% Taking [place].  She sees [facility] and last there 06/04/2023. She has lost 15 lbs.    HLD with elevated ASCVD risk - does not tolerate statins.  She is seeing cardiologist, Dr. Judithe Modest low back pain. Similar pain that she saw [facility] for and she has not followed up.    Health Maintenance Due  Topic Date Due   DTaP/Tdap/Td (2 - Td or Tdap) 08/20/2017   OPHTHALMOLOGY EXAM  02/20/2023   Medicare Annual Wellness (AWV)  08/14/2023    Past Medical History:  Diagnosis Date   Arthritis    Asthma    Back pain    Cataract    Chronic kidney disease    Closed fracture of distal end of left radius 11/15/2022   Constipation    COPD (chronic obstructive pulmonary disease) (HCC)    COPD (chronic obstructive pulmonary disease) (HCC)    Diabetes (HCC)    Diabetes mellitus without complication (HCC)    "pre-diabetic"   Emphysema of lung (HCC)    Glaucoma    Heartburn    High cholesterol    Hypertension    Hypertensive retinopathy 10/26/2021   Lactose intolerance    Open-angle glaucoma 10/26/2021   Other specified disorders of thyroid    Sleep apnea    SOB (shortness of breath)    Vitamin D deficiency     Past Surgical History:  Procedure Laterality Date   BREAST SURGERY      Family History  Problem Relation Age of Onset   Heart failure Mother    Arthritis Mother    Diabetes Mother    High blood pressure Mother    Colon polyps Father    Colon cancer Father    Cancer Father        prostate, lung   COPD Father    High Cholesterol  Father    High blood pressure Father    Dementia Father    Diabetes Brother    High blood pressure Brother    Stroke Brother    Cancer Maternal Grandfather    Miscarriages / Stillbirths Daughter    Breast cancer Cousin    Esophageal cancer Neg Hx    Liver disease Neg Hx    Rectal cancer Neg Hx    Stomach cancer Neg Hx     Social History   Socioeconomic History   Marital status: Single    Spouse name: Not on file   Number of children: 4   Years of education: Not on file   Highest education level: Master's degree (e.g., MA, MS, MEng, MEd, MSW, MBA)  Occupational History   Occupation: retired  Tobacco Use   Smoking status: Former    Current packs/day: 0.00    Average packs/day: 0.8 packs/day for 46.0 years (34.5 ttl pk-yrs)    Types: Cigarettes    Start date: 05/05/1972    Quit date: 05/05/2018    Years since quitting: 5.1  Passive exposure: Past   Smokeless tobacco: Never  Vaping Use   Vaping status: Never Used  Substance and Sexual Activity   Alcohol use: Not Currently   Drug use: Never   Sexual activity: Not on file  Other Topics Concern   Not on file  Social History Narrative   Not on file   Social Determinants of Health   Financial Resource Strain: Low Risk  (06/14/2023)   Overall Financial Resource Strain (CARDIA)    Difficulty of Paying Living Expenses: Not hard at all  Food Insecurity: No Food Insecurity (06/14/2023)   Hunger Vital Sign    Worried About Running Out of Food in the Last Year: Never true    Ran Out of Food in the Last Year: Never true  Transportation Needs: No Transportation Needs (06/14/2023)   PRAPARE - Administrator, Civil Service (Medical): No    Lack of Transportation (Non-Medical): No  Physical Activity: Insufficiently Active (06/14/2023)   Exercise Vital Sign    Days of Exercise per Week: 5 days    Minutes of Exercise per Session: 20 min  Stress: No Stress Concern Present (06/14/2023)   Leah [employer] Occup[employer]  [employer] - Occupational Stress Questionnaire    Feeling of Stress : Not at all  Social Connections: Moderately Integrated (06/14/2023)   Social Connection and Isolation Panel [NHANES]    Frequency of Communication with Friends and Family: Three times a week    Frequency of Social Gatherings with Friends and Family: Three times a week    Attends Religious Services: More than 4 times per year    Active Member of Clubs or Organizations: Yes    Attends Banker Meetings: More than 4 times per year    Marital Status: Never married  Intimate Partner Violence: Not on file    Outpatient Medications Prior to Visit  Medication Sig Dispense Refill   clotrimazole-betamethasone (LOTRISONE) cream Apply 1 Application topically daily. 30 g 0   COD LIVER OIL PO Take by mouth.     FARXIGA 5 MG TABS tablet TAKE 1 TABLET BY MOUTH ONCE DAILY BEFORE BREAKFAST 90 tablet 0   losartan-hydrochlorothiazide (HYZAAR) 100-25 MG tablet Take 1 tablet by mouth once daily 90 tablet 3   omeprazole (PRILOSEC) 20 MG capsule Take 1 capsule (20 mg total) by mouth daily. 90 capsule 3   oxyCODONE-acetaminophen (PERCOCET) 10-325 MG tablet Take 1 tablet by mouth as needed for pain.     spironolactone (ALDACTONE) 25 MG tablet Take 1 tablet by mouth once daily 90 tablet 2   tirzepatide (MOUNJARO) 10 MG/0.5ML Pen Inject 10 mg into the skin once a week. 6 mL 0   umeclidinium-vilanterol (ANORO ELLIPTA) 62.5-25 MCG/ACT AEPB Inhale 1 puff by mouth once daily 60 each 5   VENTOLIN HFA 108 (90 Base) MCG/ACT inhaler INHALE 2 PUFFS BY MOUTH EVERY 6 HOURS AS NEEDED FOR WHEEZING AND FOR SHORTNESS OF BREATH 18 g 3   rosuvastatin (CRESTOR) 10 MG tablet TAKE 1 TABLET MONDAY, WEDNESDAY, AND FRIDAY ONLY (Patient not taking: Reported on 06/18/2023) 40 tablet 3   No facility-administered medications prior to visit.    Allergies  Allergen Reactions   Hydralazine Swelling    In feet and ankle   Lisinopril Cough   Singulair [Montelukast  Sodium] Other (See Comments)    headaches   Mucinex [Guaifenesin Er] Diarrhea and Rash    Review of Systems  Constitutional:  Negative for chills and fever.  Respiratory:  Negative for  shortness of breath.   Cardiovascular:  Negative for chest pain, palpitations and leg swelling.  Gastrointestinal:  Negative for abdominal pain, constipation, diarrhea, nausea and vomiting.  Genitourinary:  Negative for dysuria, frequency and urgency.  Musculoskeletal:  Positive for back pain.  Neurological:  Negative for dizziness, tingling and focal weakness.       Objective:    Physical Exam Constitutional:      General: She is not in acute distress.    Appearance: She is not ill-appearing.  Eyes:     Extraocular Movements: Extraocular movements intact.     Conjunctiva/sclera: Conjunctivae normal.  Cardiovascular:     Rate and Rhythm: Normal rate and regular rhythm.  Pulmonary:     Effort: Pulmonary effort is normal.     Breath sounds: Normal breath sounds.  Abdominal:     General: There is no distension.     Tenderness: There is no abdominal tenderness. There is no right CVA tenderness or left CVA tenderness.  Musculoskeletal:     Cervical back: Normal range of motion and neck supple.     Thoracic back: Normal.     Lumbar back: Decreased range of motion. Negative right straight leg raise test and negative left straight leg raise test.  Skin:    General: Skin is warm and dry.     Findings: No rash.  Neurological:     General: No focal deficit present.     Mental Status: She is alert and oriented to person, place, and time.     Motor: No weakness.     Coordination: Coordination normal.     Gait: Gait normal.  Psychiatric:        Mood and Affect: Mood normal.        Behavior: Behavior normal.        Thought Content: Thought content normal.      BP 126/80 (BP Location: Left Arm, Patient Position: Sitting, Cuff Size: Large)   Pulse 94   Temp 97.6 F (36.4 C) (Temporal)   Ht 5'  2" (1.575 m)   Wt 233 lb (105.7 kg)   SpO2 95%   BMI 42.62 kg/m  Wt Readings from Last 3 Encounters:  06/18/23 233 lb (105.7 kg)  06/04/23 226 lb (102.5 kg)  05/07/23 230 lb (104.3 kg)       Assessment & Plan:   Problem List Items Addressed This Visit     Aortic atherosclerosis (HCC)    She stopped statin therapy due to myalgias. Sees cardiology.       Bilateral low back pain without sciatica    Prescribed meloxicam. Discussed conservative treatment. She will follow up with [facility]      Relevant Medications   meloxicam (MOBIC) 15 MG tablet   Other Relevant Orders   POCT Urinalysis Dipstick (Automated) (Completed)   Calculus of gallbladder without cholecystitis without obstruction    Asymptomatic. Advised to eat a low fat diet and follow up if she is having abdominal pain.       Hyperlipidemia associated with type 2 diabetes mellitus (HCC)    Stopped statin due to myalgias. Congratulated her on weight loss. LDL 114.  Follow up as recommended with Dr. Duke Salvia.       Pulmonary emphysema (HCC)    Continue inhalers. Follow up with pulmonologist as recommended.       Right renal stone    Reviewed CT results. Advised to follow up if she has hematuria or new back or groin pain. See urology as  needed.       Relevant Orders   POCT Urinalysis Dipstick (Automated) (Completed)   Statin declined    myalgias      Type 2 diabetes mellitus with obesity (HCC) - Primary    She is on Mounjaro and doing well. Last A1c 6.1%. Seeing [facility].  Upcoming cataract surgery.       Other Visit Diagnoses     Need for influenza vaccination       Relevant Orders   Flu Vaccine Trivalent High Dose (Fluad) (Completed)   Degeneration of intervertebral disc of lumbar region with discogenic back pain       Relevant Medications   meloxicam (MOBIC) 15 MG tablet   Need for pneumococcal 20-valent conjugate vaccination       Relevant Orders   Pneumococcal conjugate vaccine 20-valent (Prevnar  20) (Completed)       I am having Tashema Baba start on meloxicam. I am also having her maintain her omeprazole, oxyCODONE-acetaminophen, rosuvastatin, clotrimazole-betamethasone, spironolactone, Farxiga, Anoro Ellipta, Ventolin HFA, tirzepatide, losartan-hydrochlorothiazide, and COD LIVER OIL PO.  Meds ordered this encounter  Medications   meloxicam (MOBIC) 15 MG tablet    Sig: Take 1 tablet (15 mg total) by mouth daily.    Dispense:  30 tablet    Refill:  0    Order Specific Question:   Supervising Provider    Answer:   Hillard Danker A [[id number]]

## 2023-06-18 NOTE — Patient Instructions (Addendum)
   Start meloxicam once daily with food.  You may also take Tylenol over-the-counter 500 mg or 1000 mg twice daily if needed.  Use a heating pad.  You can also use over-the-counter topical Salonpas with lidocaine.  Call and schedule with [facility] who you saw before for your back pain.  [phone]  Call and schedule your follow-up with Dr. Vassie Loll, your pulmonologist.  Call and schedule your follow-up with Dr. Duke Salvia, cardiology.  Make sure you are up-to-date with your diabetes eye exam and if not, get this done.  If you have abdominal pain, please follow-up with Dr. Tomasa Rand, your gastroenterologist.  You have gallbladder sludge and a stone.  I recommend eating a low-fat diet.  If you have any blood in your urine, difficulty urinating or new back pain, let me know.

## 2023-06-22 DIAGNOSIS — M545 Low back pain, unspecified: Secondary | ICD-10-CM | POA: Insufficient documentation

## 2023-06-22 DIAGNOSIS — N200 Calculus of kidney: Secondary | ICD-10-CM | POA: Insufficient documentation

## 2023-06-22 DIAGNOSIS — K8020 Calculus of gallbladder without cholecystitis without obstruction: Secondary | ICD-10-CM | POA: Insufficient documentation

## 2023-06-22 NOTE — Assessment & Plan Note (Signed)
myalgias

## 2023-06-22 NOTE — Assessment & Plan Note (Signed)
Stopped statin due to myalgias. Congratulated her on weight loss. LDL 114.  Follow up as recommended with Dr. Duke Salvia.

## 2023-06-22 NOTE — Assessment & Plan Note (Signed)
Asymptomatic. Advised to eat a low fat diet and follow up if she is having abdominal pain.

## 2023-06-22 NOTE — Assessment & Plan Note (Addendum)
Continue inhalers. Follow up with pulmonologist as recommended.

## 2023-06-22 NOTE — Assessment & Plan Note (Signed)
Prescribed meloxicam. Discussed conservative treatment. She will follow up with [facility]

## 2023-06-22 NOTE — Assessment & Plan Note (Signed)
She stopped statin therapy due to myalgias. Sees cardiology.

## 2023-06-22 NOTE — Assessment & Plan Note (Addendum)
Reviewed CT results. Advised to follow up if she has hematuria or new back or groin pain. See urology as needed.

## 2023-06-22 NOTE — Assessment & Plan Note (Signed)
She is on [place] and doing well. Last A1c 6.1%. Seeing [facility].  Upcoming cataract surgery.

## 2023-06-23 ENCOUNTER — Other Ambulatory Visit: Payer: Self-pay | Admitting: Family Medicine

## 2023-06-23 DIAGNOSIS — E119 Type 2 diabetes mellitus without complications: Secondary | ICD-10-CM

## 2023-07-07 NOTE — Progress Notes (Unsigned)
SUBJECTIVE:  Chief Complaint: Obesity  Interim History: She is up 4 lbs since last visit.  Down 11 lbs overall TBW loss of 4.6%  Leah Lawson, a 67 year old patient with a history of obesity, type 2 diabetes, hypertension, and hyperlipidemia, presents for a follow-up visit regarding her obesity treatment plan.  The patient is currently on Mounjaro 10mg  weekly for diabetes and Farxiga 5mg  daily. Despite these treatments, the patient has had a recent weight gain of four pounds, which she finds puzzling given her efforts to adhere to dietary recommendations and increase physical activity.  Over the past few weeks, the patient has been consuming cereal for breakfast, removing raisins due to personal preference, and adding a small amount of blueberries. Lunch typically consists of lean meats and vegetables, with the patient consciously avoiding high-carb foods like potatoes. Despite these efforts, the patient admits to indulging slightly over the Thanksgiving holiday, including the consumption of two regular sodas and a small piece of pound cake.  The patient also reports a change in bowel habits since starting [facility], with frequent bowel movements, sometimes loose, particularly the day following her weekly dose. Despite this, the patient prefers this to constipation and has been increasing her water intake in response. She does not experience loose stools on the subsequent days. Reports no abdominal pain and no other concerns.   In addition to her primary health concerns, the patient mentions ongoing issues with her back and wrist, for which she has been prescribed meloxicam. She is scheduled for cataract surgery the first on 07/24/23 and then the other eye on 08/11/23.  Despite these challenges, the patient remains committed to her health goals and is willing to continue working on her diet and physical activity levels.  Leah Lawson is here to discuss her progress with her obesity treatment plan.  She is on the Category 2 Plan and states she is following her eating plan approximately 80 % of the time. She states she is exercising walking 40 minutes 5 times per week.   OBJECTIVE: Visit Diagnoses: Problem List Items Addressed This Visit     Morbid obesity (HCC) with starting BMI 44   Hyperlipidemia associated with type 2 diabetes mellitus (HCC)   Statin declined   Type 2 diabetes mellitus with obesity (HCC) - Primary   BMI 40.0-44.9, adult (HCC)   Bilateral low back pain without sciatica   Other Visit Diagnoses     Hypertension associated with diabetes (HCC)         Obesity 67 year old with obesity, recent 4-pound weight gain likely due to dietary indiscretions during Thanksgiving. Currently on Mounjaro 10 mg weekly. Discussed strict dietary adherence, avoiding high-sugar foods, and benefits of starting the day with a protein source or even protein shake if not able to eat meal. Emphasized journaling food intake to identify dietary patterns. - Encourage strict dietary adherence and discussed trying written journaling and provided hand outs to journal intake  - Advise journaling food intake for a couple of weeks - Recommend avoiding regular sodas and high-sugar foods - Suggest starting the day with a protein shake as generally not eating until around 11 am.  - Follow up in January to reassess weight and dietary adherence  Type 2 Diabetes Mellitus Lab Results  Component Value Date   HGBA1C 6.1 04/30/2023   HGBA1C 7.5 (H) 11/28/2022   HGBA1C 7.7 (H) 08/14/2022   Lab Results  Component Value Date   MICROALBUR <0.7 08/14/2022   LDLCALC 114 (H) 04/30/2023   CREATININE 0.98  04/30/2023    Well-controlled with A1c of 6.1. On Mounjaro 10 mg weekly and Farxiga 5 mg daily. Discussed maintaining blood glucose levels and role of current medications. - Continue Mounjaro 10 mg weekly. No refill needed.  - Continue Farxiga 5 mg daily Continue working  on nutrition plan to decrease  simple carbohydrates, increase lean proteins and exercise to promote weight loss and improve glycemic control . Recheck renal function over the next 1-2 months.   Hypertension Well-controlled. Blood pressure today is 121/77 mmHg. Discussed potential for reducing antihypertensive medication if blood pressure remains consistently low on GLP-1RA medication and Farxiga. On Hyzaar 100-25 mg daily and spironolactone 25 mg once daily.  - Monitor blood pressure at home for a couple of weeks - Consider reducing antihypertensive medication if blood pressure remains consistently low Recheck renal function over the next 1-2 months.   Hyperlipidemia The 10-year ASCVD risk score (Arnett DK, et al., 2019) is: 19.5%   Values used to calculate the score:     Age: 2 years     Sex: Female     Is Non-Hispanic African American: Yes     Diabetic: Yes     Tobacco smoker: No     Systolic Blood Pressure: 121 mmHg     Is BP treated: Yes     HDL Cholesterol: 37.8 mg/dL     Total Cholesterol: 175 mg/dL  Declined statin therapy. Discussed importance of lifestyle modifications including diet and exercise to manage lipid levels. - Monitor lipid levels - Encourage lifestyle modifications including diet and exercise Continue to work on nutrition plan -decreasing simple carbohydrates, increasing lean proteins, decreasing saturated fats and cholesterol , avoiding trans fats and exercise as able to promote weight loss, improve lipids and decrease cardiovascular risks.   Chronic Back Pain Managed with meloxicam, reports some improvement. Discussed need to monitor for effectiveness and potential side effects. - Continue meloxicam as prescribed - Monitor for effectiveness and side effects  Cataracts Scheduled for cataract surgery on December 19th and January 6th. Discussed expected outcomes and importance of post-operative follow-up. - Proceed with scheduled cataract surgeries - Ensure post-operative  follow-up  General Health Maintenance Discussed importance of balanced diet, regular exercise, and avoiding high-sugar foods. Emphasized role of protein intake and need to avoid dietary indiscretions. - Encourage regular physical activity - Advise on balanced diet with adequate protein intake - Avoid high-sugar foods and beverages  Follow-up - Follow up on January 2nd at 10:30 AM to reassess weight, dietary adherence, and overall health.  Vitals Temp: 98.2 F (36.8 C) BP: 121/77 Pulse Rate: 67 SpO2: 96 %   Anthropometric Measurements Height: 5\' 2"  (1.575 m) Weight: 230 lb (104.3 kg) BMI (Calculated): 42.06 Weight at Last Visit: 226 lb Weight Lost Since Last Visit: 0 Weight Gained Since Last Visit: 230 lb Starting Weight: 241 lb Total Weight Loss (lbs): 11 lb (4.99 kg) Peak Weight: 250 lb   Body Composition  Body Fat %: 50.2 % Fat Mass (lbs): 115.6 lbs Muscle Mass (lbs): 109 lbs Total Body Water (lbs): 82 lbs Visceral Fat Rating : 18   Other Clinical Data Fasting: no Labs: no Today's Visit #: 10 Starting Date: 11/27/22     ASSESSMENT AND PLAN:  Diet: Ambriella is currently in the action stage of change. As such, her goal is to continue with weight loss efforts. She has agreed to Category 2 Plan and keeping a food journal and adhering to recommended goals of 1200-1400 calories and 85 grams of protein.  Exercise: Cyndia has been instructed to work up to a goal of 150 minutes of combined cardio and strengthening exercise per week for weight loss and overall health benefits.   Behavior Modification:  We discussed the following Behavioral Modification Strategies today: increasing lean protein intake, decreasing simple carbohydrates, increasing vegetables, increase H2O intake, increase high fiber foods, no skipping meals, better snacking choices, holiday eating strategies, planning for success, and keep a strict food journal. We discussed various medication options  to help Cherisa with her weight loss efforts and we both agreed to continue Mounjaro 10 mg weekly for Type 2 diabetes management.  Return in about 4 weeks (around 08/05/2023).[name] She was informed of the importance of frequent follow up visits to maximize her success with intensive lifestyle modifications for her multiple health conditions.  Attestation Statements:   Reviewed by clinician on day of visit: allergies, medications, problem list, medical history, surgical history, family history, social history, and previous encounter notes.   Time spent on visit including pre-visit chart review and post-visit care and charting was 41 minutes.    Santiana Glidden, PA-C

## 2023-07-08 ENCOUNTER — Encounter (INDEPENDENT_AMBULATORY_CARE_PROVIDER_SITE_OTHER): Payer: Self-pay | Admitting: Physician Assistant

## 2023-07-08 ENCOUNTER — Ambulatory Visit (INDEPENDENT_AMBULATORY_CARE_PROVIDER_SITE_OTHER): Payer: Medicare Other | Admitting: Physician Assistant

## 2023-07-08 VITALS — BP 121/77 | HR 67 | Temp 98.2°F | Ht 62.0 in | Wt 230.0 lb

## 2023-07-08 DIAGNOSIS — G8929 Other chronic pain: Secondary | ICD-10-CM | POA: Diagnosis not present

## 2023-07-08 DIAGNOSIS — E1159 Type 2 diabetes mellitus with other circulatory complications: Secondary | ICD-10-CM | POA: Diagnosis not present

## 2023-07-08 DIAGNOSIS — Z7985 Long-term (current) use of injectable non-insulin antidiabetic drugs: Secondary | ICD-10-CM

## 2023-07-08 DIAGNOSIS — Z7984 Long term (current) use of oral hypoglycemic drugs: Secondary | ICD-10-CM

## 2023-07-08 DIAGNOSIS — Z5320 Procedure and treatment not carried out because of patient's decision for unspecified reasons: Secondary | ICD-10-CM

## 2023-07-08 DIAGNOSIS — Z6841 Body Mass Index (BMI) 40.0 and over, adult: Secondary | ICD-10-CM

## 2023-07-08 DIAGNOSIS — E785 Hyperlipidemia, unspecified: Secondary | ICD-10-CM

## 2023-07-08 DIAGNOSIS — E1169 Type 2 diabetes mellitus with other specified complication: Secondary | ICD-10-CM

## 2023-07-08 DIAGNOSIS — K5903 Drug induced constipation: Secondary | ICD-10-CM

## 2023-07-08 DIAGNOSIS — M545 Low back pain, unspecified: Secondary | ICD-10-CM

## 2023-07-08 DIAGNOSIS — I152 Hypertension secondary to endocrine disorders: Secondary | ICD-10-CM

## 2023-07-11 ENCOUNTER — Ambulatory Visit (INDEPENDENT_AMBULATORY_CARE_PROVIDER_SITE_OTHER): Payer: Medicare Other

## 2023-07-11 DIAGNOSIS — Z0000 Encounter for general adult medical examination without abnormal findings: Secondary | ICD-10-CM | POA: Diagnosis not present

## 2023-07-11 NOTE — Progress Notes (Signed)
Subjective:   Leah Lawson is a 67 y.o. female who presents for Medicare Annual (Subsequent) preventive examination.  Visit Complete: Virtual I connected with  Army Chaco on 07/11/23 by a audio enabled telemedicine application and verified that I am speaking with the correct person using two identifiers.  Patient Location: Home  Provider Location: Office/Clinic  I discussed the limitations of evaluation and management by telemedicine. The patient expressed understanding and agreed to proceed.  Vital Signs: Because this visit was a virtual/telehealth visit, some criteria may be missing or patient reported. Any vitals not documented were not able to be obtained and vitals that have been documented are patient reported.  Patient Medicare AWV questionnaire was completed by the patient on 07/08/2023; I have confirmed that all information answered by patient is correct and no changes since this date.  Cardiac Risk Factors include: advanced age (>83men, >46 women);diabetes mellitus;dyslipidemia;hypertension     Objective:    Today's Vitals   There is no height or weight on file to calculate BMI.     07/11/2023    8:19 AM 09/04/2022   11:26 AM 08/13/2022    1:05 PM 02/28/2022    8:25 PM 03/13/2020    6:17 PM 11/25/2019   10:47 PM 05/09/2018    4:27 AM  Advanced Directives  Does Patient Have a Medical Advance Directive? No No No No No No   Would patient like information on creating a medical advance directive?  No - Patient declined No - Patient declined No - Patient declined No - Patient declined No - Patient declined No - Patient declined    Current Medications (verified) Outpatient Encounter Medications as of 07/11/2023  Medication Sig   COD LIVER OIL PO Take by mouth.   FARXIGA 5 MG TABS tablet TAKE 1 TABLET BY MOUTH ONCE DAILY BEFORE BREAKFAST   losartan-hydrochlorothiazide (HYZAAR) 100-25 MG tablet Take 1 tablet by mouth once daily   meloxicam (MOBIC) 15 MG tablet Take 1  tablet (15 mg total) by mouth daily.   omeprazole (PRILOSEC) 20 MG capsule Take 1 capsule (20 mg total) by mouth daily.   oxyCODONE-acetaminophen (PERCOCET) 10-325 MG tablet Take 1 tablet by mouth as needed for pain.   spironolactone (ALDACTONE) 25 MG tablet Take 1 tablet by mouth once daily   tirzepatide (MOUNJARO) 10 MG/0.5ML Pen Inject 10 mg into the skin once a week.   umeclidinium-vilanterol (ANORO ELLIPTA) 62.5-25 MCG/ACT AEPB Inhale 1 puff by mouth once daily   VENTOLIN HFA 108 (90 Base) MCG/ACT inhaler INHALE 2 PUFFS BY MOUTH EVERY 6 HOURS AS NEEDED FOR WHEEZING AND FOR SHORTNESS OF BREATH   clotrimazole-betamethasone (LOTRISONE) cream Apply 1 Application topically daily. (Patient not taking: Reported on 07/11/2023)   rosuvastatin (CRESTOR) 10 MG tablet TAKE 1 TABLET MONDAY, WEDNESDAY, AND FRIDAY ONLY (Patient not taking: Reported on 07/11/2023)   No facility-administered encounter medications on file as of 07/11/2023.    Allergies (verified) Hydralazine, Lisinopril, Singulair [montelukast sodium], and Mucinex [guaifenesin er]   History: Past Medical History:  Diagnosis Date   Arthritis    Asthma    Back pain    Cataract    Chronic kidney disease    Closed fracture of distal end of left radius 11/15/2022   Constipation    COPD (chronic obstructive pulmonary disease) (HCC)    COPD (chronic obstructive pulmonary disease) (HCC)    Diabetes (HCC)    Diabetes mellitus without complication (HCC)    "pre-diabetic"   Emphysema of lung (HCC)  Glaucoma    Heartburn    High cholesterol    Hypertension    Hypertensive retinopathy 10/26/2021   Lactose intolerance    Open-angle glaucoma 10/26/2021   Other specified disorders of thyroid    Sleep apnea    SOB (shortness of breath)    Vitamin D deficiency    Past Surgical History:  Procedure Laterality Date   BREAST SURGERY     WRIST SURGERY Left 11/18/2022   Family History  Problem Relation Age of Onset   Heart failure  Mother    Arthritis Mother    Diabetes Mother    High blood pressure Mother    Colon polyps Father    Colon cancer Father    Cancer Father        prostate, lung   COPD Father    High Cholesterol Father    High blood pressure Father    Dementia Father    Diabetes Brother    High blood pressure Brother    Stroke Brother    Cancer Maternal Grandfather    Miscarriages / Stillbirths Daughter    Breast cancer Cousin    Esophageal cancer Neg Hx    Liver disease Neg Hx    Rectal cancer Neg Hx    Stomach cancer Neg Hx    Social History   Socioeconomic History   Marital status: Single    Spouse name: Not on file   Number of children: 4   Years of education: Not on file   Highest education level: Master's degree (e.g., MA, MS, MEng, MEd, MSW, MBA)  Occupational History   Occupation: retired  Tobacco Use   Smoking status: Former    Current packs/day: 0.00    Average packs/day: 0.8 packs/day for 46.0 years (34.5 ttl pk-yrs)    Types: Cigarettes    Start date: 05/05/1972    Quit date: 05/05/2018    Years since quitting: 5.1    Passive exposure: Past   Smokeless tobacco: Never  Vaping Use   Vaping status: Never Used  Substance and Sexual Activity   Alcohol use: Not Currently   Drug use: Never   Sexual activity: Not on file  Other Topics Concern   Not on file  Social History Narrative   Not on file   Social Determinants of Health   Financial Resource Strain: Low Risk  (07/08/2023)   Overall Financial Resource Strain (CARDIA)    Difficulty of Paying Living Expenses: Not very hard  Food Insecurity: No Food Insecurity (07/08/2023)   Hunger Vital Sign    Worried About Running Out of Food in the Last Year: Never true    Ran Out of Food in the Last Year: Never true  Transportation Needs: No Transportation Needs (07/08/2023)   PRAPARE - Administrator, Civil Service (Medical): No    Lack of Transportation (Non-Medical): No  Physical Activity: Insufficiently Active  (07/08/2023)   Exercise Vital Sign    Days of Exercise per Week: 4 days    Minutes of Exercise per Session: 30 min  Stress: No Stress Concern Present (07/08/2023)   [employer] of Occupational Health - Occupational Stress Questionnaire    Feeling of Stress : Not at all  Social Connections: Moderately Integrated (07/08/2023)   Social Connection and Isolation Panel [NHANES]    Frequency of Communication with Friends and Family: More than three times a week    Frequency of Social Gatherings with Friends and Family: Twice a week    Attends  Religious Services: More than 4 times per year    Active Member of Clubs or Organizations: Yes    Attends Banker Meetings: More than 4 times per year    Marital Status: Never married    Tobacco Counseling Counseling given: Not Answered   Clinical Intake:  Pre-visit preparation completed: Yes  Pain : No/denies pain     Nutritional Risks: None Diabetes: Yes CBG done?: No Did pt. bring in CBG monitor from home?: No  How often do you need to have someone help you when you read instructions, pamphlets, or other written materials from your doctor or pharmacy?: 1 - Never  Interpreter Needed?: No  Information entered by :: NAllen LPN   Activities of Daily Living    07/08/2023    3:07 PM 04/26/2023    8:43 AM  In your present state of health, do you have any difficulty performing the following activities:  Hearing? 0 0  Vision? 1 1  Comment has cataracts   Difficulty concentrating or making decisions? 0 0  Walking or climbing stairs? 1 1  Comment get SOB   Dressing or bathing? 0 0  Doing errands, shopping? 0 0  Preparing Food and eating ? N N  Using the Toilet? N N  In the past six months, have you accidently leaked urine? N N  Do you have problems with loss of bowel control? N N  Managing your Medications? N N  Managing your Finances? N N  Housekeeping or managing your Housekeeping? N N    Patient Care  Team: Avanell Shackleton, NP-C as PCP - General (Family Medicine) Davina Poke as Consulting Physician (Optometry)  Indicate any recent Medical Services you may have received from other than [facility] providers in the past year (date may be approximate).     Assessment:   This is a routine wellness examination for Itzia.  Hearing/Vision screen Hearing Screening - Comments:: Denies hearing issues Vision Screening - Comments:: Regular eye exams, Dr. London Sheer   Goals Addressed             This Visit's Progress    Patient Stated       07/11/2023, wants to lose weight       Depression Screen    07/11/2023    8:21 AM 04/30/2023    1:44 PM 08/13/2022    1:10 PM 01/31/2022   10:28 AM 10/18/2021   10:31 AM  PHQ 2/9 Scores  PHQ - 2 Score 0 0 0 0 0  PHQ- 9 Score 0        Fall Risk    07/08/2023    3:07 PM 04/30/2023    1:44 PM 04/26/2023    8:43 AM 04/21/2023    8:44 AM 08/21/2022   10:37 AM  Fall Risk   Falls in the past year? 1 0 1 1 0  Comment tripped on sidewalk      Number falls in past yr: 0 0 0 0 0  Injury with Fall? 1 0 1 1 0  Comment broke wrist      Risk for fall due to : Medication side effect No Fall Risks   No Fall Risks  Follow up Falls prevention discussed;Falls evaluation completed Falls evaluation completed   Falls prevention discussed    MEDICARE RISK AT HOME: Medicare Risk at Home Any stairs in or around the home?: No Home free of loose throw rugs in walkways, pet beds, electrical cords, etc?: Yes Adequate lighting  in your home to reduce risk of falls?: Yes Life alert?: No Use of a cane, walker or w/c?: No Grab bars in the bathroom?: Yes Shower chair or bench in shower?: Yes Elevated toilet seat or a handicapped toilet?: Yes  TIMED UP AND GO:  Was the test performed?  No    Cognitive Function:        07/11/2023    8:21 AM 08/13/2022    1:11 PM  6CIT Screen  What Year? 0 points 0 points  What month? 0 points 0 points  What time? 0 points 0  points  Count back from 20 0 points 0 points  Months in reverse 0 points 0 points  Repeat phrase 0 points 0 points  Total Score 0 points 0 points    Immunizations Immunization History  Administered Date(s) Administered   Fluad Quad(high Dose 65+) 06/05/2022   Fluad Trivalent(High Dose 65+) 06/18/2023   Influenza,inj,Quad PF,6+ Mos 05/10/2018   PFIZER(Purple Top)SARS-COV-2 Vaccination 01/24/2020, 02/14/2020   PNEUMOCOCCAL CONJUGATE-20 06/18/2023   Pfizer Covid-19 Vaccine Bivalent Booster 41yrs & up 09/02/2020, 10/30/2021   Pneumococcal Polysaccharide-23 05/10/2018   Tdap 08/21/2007, 04/27/2020    TDAP status: Up to date  Flu Vaccine status: Up to date  Pneumococcal vaccine status: Up to date  Covid-19 vaccine status: Information provided on how to obtain vaccines.   Qualifies for Shingles Vaccine? Yes   Zostavax completed No   Shingrix Completed?: No.    Education has been provided regarding the importance of this vaccine. Patient has been advised to call insurance company to determine out of pocket expense if they have not yet received this vaccine. Advised may also receive vaccine at local pharmacy or Health Dept. Verbalized acceptance and understanding.  Screening Tests Health Maintenance  Topic Date Due   OPHTHALMOLOGY EXAM  02/20/2023   COVID-19 Vaccine (5 - 2023-24 season) 07/27/2023 (Originally 04/06/2023)   Diabetic kidney evaluation - Urine ACR  08/15/2023   HEMOGLOBIN A1C  10/28/2023   Colonoscopy  01/29/2024   FOOT EXAM  03/25/2024   Lung Cancer Screening  04/27/2024   Diabetic kidney evaluation - eGFR measurement  04/29/2024   Medicare Annual Wellness (AWV)  07/10/2024   MAMMOGRAM  06/17/2025   DTaP/Tdap/Td (3 - Td or Tdap) 04/27/2030   Pneumonia Vaccine 72+ Years old  Completed   INFLUENZA VACCINE  Completed   DEXA SCAN  Completed   Hepatitis C Screening  Completed   HPV VACCINES  Aged Out   Zoster Vaccines- Shingrix  Discontinued    Health  Maintenance  Health Maintenance Due  Topic Date Due   OPHTHALMOLOGY EXAM  02/20/2023    Colorectal cancer screening: Type of screening: Colonoscopy. Completed 01/29/2023. Repeat every 1 years  Mammogram status: Completed 06/18/2023. Repeat every year  Bone Density status: Completed 03/26/2023.   Lung Cancer Screening: (Low Dose CT Chest recommended if Age 3-80 years, 20 pack-year currently smoking OR have quit w/in 15years.) does not qualify.   Lung Cancer Screening Referral: no  Additional Screening:  Hepatitis C Screening: does qualify; Completed 08/28/2010  Vision Screening: Recommended annual ophthalmology exams for early detection of glaucoma and other disorders of the eye. Is the patient up to date with their annual eye exam?  Yes  Who is the provider or what is the name of the office in which the patient attends annual eye exams? Dr. Shea Evans If pt is not established with a provider, would they like to be referred to a provider to establish care? No .  Dental Screening: Recommended annual dental exams for proper oral hygiene  Diabetic Foot Exam: Diabetic Foot Exam: Completed 03/26/2023  Community Resource Referral / Chronic Care Management: CRR required this visit?  No   CCM required this visit?  No     Plan:     I have personally reviewed and noted the following in the patient's chart:   Medical and social history Use of alcohol, tobacco or illicit drugs  Current medications and supplements including opioid prescriptions. Patient is currently taking opioid prescriptions. Information provided to patient regarding non-opioid alternatives. Patient advised to discuss non-opioid treatment plan with their provider. Functional ability and status Nutritional status Physical activity Advanced directives List of other physicians Hospitalizations, surgeries, and ER visits in previous 12 months Vitals Screenings to include cognitive, depression, and falls Referrals and  appointments  In addition, I have reviewed and discussed with patient certain preventive protocols, quality metrics, and best practice recommendations. A written personalized care plan for preventive services as well as general preventive health recommendations were provided to patient.     Barb Merino, LPN   73/09/2023   After Visit Summary: (MyChart) Due to this being a telephonic visit, the after visit summary with patients personalized plan was offered to patient via MyChart   Nurse Notes: none

## 2023-07-11 NOTE — Patient Instructions (Signed)
[name] , Thank you for taking time to come for your Medicare Wellness Visit. I appreciate your ongoing commitment to your health goals. Please review the following plan we discussed and let me know if I can assist you in the future.   Referrals/Orders/Follow-Ups/Clinician Recommendations: none  Managing Pain Without Opioids Opioids are strong medicines used to treat moderate to severe pain. For some people, especially those who have long-term (chronic) pain, opioids may not be the best choice for pain management due to: Side effects like nausea, constipation, and sleepiness. The risk of addiction (opioid use disorder). The longer you take opioids, the greater your risk of addiction. Pain that lasts for more than 3 months is called chronic pain. Managing chronic pain usually requires more than one approach and is often provided by a team of health care providers working together (multidisciplinary approach). Pain management may be done at a pain management center or pain clinic. How to manage pain without the use of opioids Use non-opioid medicines Non-opioid medicines for pain may include: Over-the-counter or prescription non-steroidal anti-inflammatory drugs (NSAIDs). These may be the first medicines used for pain. They work well for muscle and bone pain, and they reduce swelling. Acetaminophen. This over-the-counter medicine may work well for milder pain but not swelling. Antidepressants. These may be used to treat chronic pain. A certain type of antidepressant (tricyclics) is often used. These medicines are given in lower doses for pain than when used for depression. Anticonvulsants. These are usually used to treat seizures but may also reduce nerve (neuropathic) pain. Muscle relaxants. These relieve pain caused by sudden muscle tightening (spasms). You may also use a pain medicine that is applied to the skin as a patch, cream, or gel (topical analgesic), such as a numbing medicine. These  may cause fewer side effects than medicines taken by mouth. Do certain therapies as directed Some therapies can help with pain management. They include: Physical therapy. You will do exercises to gain strength and flexibility. A physical therapist may teach you exercises to move and stretch parts of your body that are weak, stiff, or painful. You can learn these exercises at physical therapy visits and practice them at home. Physical therapy may also involve: Massage. Heat wraps or applying heat or cold to affected areas. Electrical signals that interrupt pain signals (transcutaneous electrical nerve stimulation, TENS). Weak lasers that reduce pain and swelling (low-level laser therapy). Signals from your body that help you learn to regulate pain (biofeedback). Occupational therapy. This helps you to learn ways to function at home and work with less pain. Recreational therapy. This involves trying new activities or hobbies, such as a physical activity or drawing. Mental health therapy, including: Cognitive behavioral therapy (CBT). This helps you learn coping skills for dealing with pain. Acceptance and commitment therapy (ACT) to change the way you think and react to pain. Relaxation therapies, including muscle relaxation exercises and mindfulness-based stress reduction. Pain management counseling. This may be individual, family, or group counseling.  Receive medical treatments Medical treatments for pain management include: Nerve block injections. These may include a pain blocker and anti-inflammatory medicines. You may have injections: Near the spine to relieve chronic back or neck pain. Into joints to relieve back or joint pain. Into nerve areas that supply a painful area to relieve body pain. Into muscles (trigger point injections) to relieve some painful muscle conditions. A medical device placed near your spine to help block pain signals and relieve nerve pain or chronic back pain  (spinal  cord stimulation device). Acupuncture. Follow these instructions at home Medicines Take over-the-counter and prescription medicines only as told by your health care provider. If you are taking pain medicine, ask your health care providers about possible side effects to watch out for. Do not drive or use heavy machinery while taking prescription opioid pain medicine. Lifestyle  Do not use drugs or alcohol to reduce pain. If you drink alcohol, limit how much you have to: 0-1 drink a day for women who are not pregnant. 0-2 drinks a day for men. Know how much alcohol is in a drink. In the [place] one drink equals one 12 oz bottle of beer (355 mL), one 5 oz glass of wine (148 mL), or one 1 oz glass of hard liquor (44 mL). Do not use any products that contain nicotine or tobacco. These products include cigarettes, chewing tobacco, and vaping devices, such as e-cigarettes. If you need help quitting, ask your health care provider. Eat a healthy diet and maintain a healthy weight. Poor diet and excess weight may make pain worse. Eat foods that are high in fiber. These include fresh fruits and vegetables, whole grains, and beans. Limit foods that are high in fat and processed sugars, such as fried and sweet foods. Exercise regularly. Exercise lowers stress and may help relieve pain. Ask your health care provider what activities and exercises are safe for you. If your health care provider approves, join an exercise class that combines movement and stress reduction. Examples include yoga and tai chi. Get enough sleep. Lack of sleep may make pain worse. Lower stress as much as possible. Practice stress reduction techniques as told by your therapist. General instructions Work with all your pain management providers to find the treatments that work best for you. You are an important member of your pain management team. There are many things you can do to reduce pain on your own. Consider joining an  online or in-person support group for people who have chronic pain. Keep all follow-up visits. This is important. Where to find more information You can find more information about managing pain without opioids from: American Academy of Pain Medicine: painmed.org Institute for Chronic Pain: instituteforchronicpain.org American Chronic Pain Association: theacpa.org Contact a health care provider if: You have side effects from pain medicine. Your pain gets worse or does not get better with treatments or home therapy. You are struggling with anxiety or depression. Summary Many types of pain can be managed without opioids. Chronic pain may respond better to pain management without opioids. Pain is best managed when you and a team of health care providers work together. Pain management without opioids may include non-opioid medicines, medical treatments, physical therapy, mental health therapy, and lifestyle changes. Tell your health care providers if your pain gets worse or is not being managed well enough. This information is not intended to replace advice given to you by your health care provider. Make sure you discuss any questions you have with your health care provider. Document Revised: 11/01/2020 Document Reviewed: 11/01/2020 Elsevier Patient Education  2024 Elsevier Inc.   This is a list of the screening recommended for you and due dates:  Health Maintenance  Topic Date Due   Eye exam for diabetics  02/20/2023   COVID-19 Vaccine (5 - 2023-24 season) 07/27/2023*   Yearly kidney health urinalysis for diabetes  08/15/2023   Hemoglobin A1C  10/28/2023   Colon Cancer Screening  01/29/2024   Complete foot exam   03/25/2024   Screening for  Lung Cancer  04/27/2024   Yearly kidney function blood test for diabetes  04/29/2024   Medicare Annual Wellness Visit  07/10/2024   Mammogram  06/17/2025   DTaP/Tdap/Td vaccine (3 - Td or Tdap) 04/27/2030   Pneumonia Vaccine  Completed   Flu Shot   Completed   DEXA scan (bone density measurement)  Completed   Hepatitis C Screening  Completed   HPV Vaccine  Aged Out   Zoster (Shingles) Vaccine  Discontinued  *Topic was postponed. The date shown is not the original due date.    Advanced directives: (ACP Link)Information on Advanced Care Planning can be found at [facility] Advance Health Care Directives Advance Health Care Directives ([email or web address]   Next Medicare Annual Wellness Visit scheduled for next year: Yes  Insert Preventive Care attachment Insert FALL PREVENTION attachment if needed

## 2023-07-12 ENCOUNTER — Other Ambulatory Visit: Payer: Self-pay | Admitting: Family Medicine

## 2023-07-12 DIAGNOSIS — M5136 Other intervertebral disc degeneration, lumbar region with discogenic back pain only: Secondary | ICD-10-CM

## 2023-07-12 DIAGNOSIS — M545 Low back pain, unspecified: Secondary | ICD-10-CM

## 2023-07-14 NOTE — Telephone Encounter (Signed)
LOV: 06/18/23 Last filled: 06/18/23, 30 tablets 0 refill

## 2023-07-28 ENCOUNTER — Encounter (INDEPENDENT_AMBULATORY_CARE_PROVIDER_SITE_OTHER): Payer: Self-pay | Admitting: Family Medicine

## 2023-07-28 NOTE — Telephone Encounter (Signed)
 Care team updated and letter sent for eye exam notes.

## 2023-08-07 ENCOUNTER — Encounter (INDEPENDENT_AMBULATORY_CARE_PROVIDER_SITE_OTHER): Payer: Self-pay | Admitting: Physician Assistant

## 2023-08-07 ENCOUNTER — Ambulatory Visit (INDEPENDENT_AMBULATORY_CARE_PROVIDER_SITE_OTHER): Payer: Medicare Other | Admitting: Physician Assistant

## 2023-08-07 VITALS — BP 116/76 | HR 94 | Temp 98.3°F | Ht 62.0 in | Wt 227.0 lb

## 2023-08-07 DIAGNOSIS — Z7985 Long-term (current) use of injectable non-insulin antidiabetic drugs: Secondary | ICD-10-CM

## 2023-08-07 DIAGNOSIS — E1169 Type 2 diabetes mellitus with other specified complication: Secondary | ICD-10-CM

## 2023-08-07 DIAGNOSIS — E1159 Type 2 diabetes mellitus with other circulatory complications: Secondary | ICD-10-CM

## 2023-08-07 DIAGNOSIS — E785 Hyperlipidemia, unspecified: Secondary | ICD-10-CM | POA: Diagnosis not present

## 2023-08-07 DIAGNOSIS — I152 Hypertension secondary to endocrine disorders: Secondary | ICD-10-CM | POA: Diagnosis not present

## 2023-08-07 DIAGNOSIS — Z7984 Long term (current) use of oral hypoglycemic drugs: Secondary | ICD-10-CM

## 2023-08-07 DIAGNOSIS — Z9841 Cataract extraction status, right eye: Secondary | ICD-10-CM

## 2023-08-07 DIAGNOSIS — Z6841 Body Mass Index (BMI) 40.0 and over, adult: Secondary | ICD-10-CM

## 2023-08-07 DIAGNOSIS — Z5320 Procedure and treatment not carried out because of patient's decision for unspecified reasons: Secondary | ICD-10-CM

## 2023-08-07 MED ORDER — TIRZEPATIDE 10 MG/0.5ML ~~LOC~~ SOAJ: 10.0000 mg | SUBCUTANEOUS | 0 refills | Status: DC

## 2023-08-07 NOTE — Progress Notes (Signed)
 SUBJECTIVE:  Chief Complaint: Obesity  Interim History: She is down 3 lbs since her last visit.  Down 14 lbs overall. TBW loss of 5.8% Leah Lawson, a 68 year old patient with a history of type 2 diabetes, hypertension, and hyperlipidemia, has been following an obesity treatment plan. The patient has declined statin therapy and is currently on Farxiga  5mg  daily and Mounjaro  10mg  weekly for diabetes management, and Losartan -Hydrochlorothiazide  100/25mg  daily for hypertension.  The patient reported a weight loss of three pounds over the holiday season, despite a previous pattern of weight regain after loss. She underwent eye surgery, which temporarily halted her exercise regimen. The patient is scheduled for a second eye surgery in the near future.  The patient reported no overeating during the holiday season and no adverse effects from her current medications. She has been trying to incorporate protein shakes into her diet, but is still figuring out the best timing for consumption in relation to her medication schedule.  The patient also reported feeling hungry after dinner, despite having eaten. We discussed considering incorporating high-protein snacks into her diet to address this issue. The patient is also dealing with the management of acid reflux, taking medication first thing in the morning before consuming coffee.  Leah Lawson is here to discuss her progress with her obesity treatment plan. She is on the Category 2 Plan and states she is following her eating plan approximately 75 % of the time. She states she is not exercising 0 minutes 0 times per week.   OBJECTIVE: Visit Diagnoses: Problem List Items Addressed This Visit     Morbid obesity (HCC) with starting BMI 44   Relevant Medications   tirzepatide  (MOUNJARO ) 10 MG/0.5ML Pen   Hyperlipidemia associated with type 2 diabetes mellitus (HCC)   Relevant Medications   tirzepatide  (MOUNJARO ) 10 MG/0.5ML Pen   Statin declined    Type 2 diabetes mellitus with obesity (HCC) - Primary   Relevant Medications   tirzepatide  (MOUNJARO ) 10 MG/0.5ML Pen   BMI 40.0-44.9, adult (HCC)   Relevant Medications   tirzepatide  (MOUNJARO ) 10 MG/0.5ML Pen   Other Visit Diagnoses       Hypertension associated with diabetes (HCC)       Relevant Medications   tirzepatide  (MOUNJARO ) 10 MG/0.5ML Pen     Obesity Following up for obesity management. Experienced recent weight fluctuations. Unable to exercise due to recent eye surgery but staying active by walking in stores. On Mounjaro  10 mg weekly, which should help with weight management and cholesterol levels. Discussed dietary habits, including protein shakes and timing of meals. Informed about the importance of protein intake and the potential benefits of using Fairlife milk for additional protein. - Refill Mounjaro  10 mg weekly - Encourage continued physical activity as tolerated - Discuss dietary habits and protein intake, including the use of protein shakes and timing of meals  Type 2 Diabetes Mellitus Lab Results  Component Value Date   HGBA1C 6.1 04/30/2023   HGBA1C 7.5 (H) 11/28/2022   HGBA1C 7.7 (H) 08/14/2022   Lab Results  Component Value Date   MICROALBUR <0.7 08/14/2022   LDLCALC 114 (H) 04/30/2023   CREATININE 0.98 04/30/2023    On Farxiga  5 mg daily and Mounjaro  10 mg weekly for diabetes management. No issues with medications. Monitoring blood glucose levels. Plan to recheck A1c and other labs in late February or early March after eye surgeries. Continue Farxiga  5 mg daily prescribed by PCP.  - Refill Mounjaro  10 mg weekly She is working  on  nutrition plan to decrease simple carbohydrates, increase lean proteins and exercise to promote weight loss and improve glycemic control . - Plan for A1c and other labs in late February or early March after eye surgeries  Hypertension On losartan -hydrochlorothiazide  100/25 mg daily. Well-managed blood pressure. - Continue  losartan -hydrochlorothiazide  100/25 mg daily - Monitor blood pressure regularly  Hyperlipidemia The 10-year ASCVD risk score (Arnett DK, et al., 2019) is: 18%   Values used to calculate the score:     Age: 55 years     Sex: Female     Is Non-Hispanic African American: Yes     Diabetic: Yes     Tobacco smoker: No     Systolic Blood Pressure: 116 mmHg     Is BP treated: Yes     HDL Cholesterol: 37.8 mg/dL     Total Cholesterol: 175 mg/dL  Declined statin therapy. Currently not on any cholesterol medication but on Mounjaro , which may help with cholesterol levels. Plan to discuss alternative medications with Cardiology- Dr. Raford. Informed about the potential benefits of newer medications and the need to recheck labs after eye surgeries to assess cholesterol levels. - Discuss cholesterol management with Dr. Raford - Consider alternative medications if needed - Recheck labs after eye surgeries to assess cholesterol levels  Gastroesophageal Reflux Disease (GERD) Taking medication for acid reflux. Advised to take the medication first thing in the morning before consuming coffee or other foods. Discussed the importance of timing and sitting up after taking the medication. - Take acid reflux medication first thing in the morning before coffee or food - Monitor for any symptoms or side effects  Post-Operative Care (Eye Surgery) Recently had eye surgery and scheduled for another surgery next week. Managing post-operative care with eye drops and created own schedule for administration. Informed about the importance of keeping track of eye drop administration for recovery. - Continue using prescribed eye drops as directed - Keep track of eye drop administration schedule - Follow up with ophthalmologist as scheduled  Follow-up - Schedule follow-up appointment on January 29 at 11:00 AM - Plan for fasting labs in late February or early March to assess diabetes and cholesterol  levels.  Vitals Temp: 98.3 F (36.8 C) BP: 116/76 Pulse Rate: 94 SpO2: 90 %   Anthropometric Measurements Height: 5' 2 (1.575 m) Weight: 227 lb (103 kg) BMI (Calculated): 41.51 Weight at Last Visit: 230 lb Weight Lost Since Last Visit: 3 lb Total Weight Loss (lbs): 14 lb (6.35 kg) Peak Weight: 250 lb   Body Composition  Body Fat %: 49 % Fat Mass (lbs): 111.4 lbs Muscle Mass (lbs): 110 lbs Total Body Water (lbs): 76.6 lbs Visceral Fat Rating : 18   Other Clinical Data Fasting: no Labs: no Today's Visit #: 11 Starting Date: 11/27/22     ASSESSMENT AND PLAN:  Diet: [place] is currently in the action stage of change. As such, her goal is to continue with weight loss efforts. She has agreed to Category 2 Plan.  Exercise: [place] has been instructed  okayed for walking but no exercising more vigorously due to eye surgery   for weight loss and overall health benefits.   Behavior Modification:  We discussed the following Behavioral Modification Strategies today: increasing lean protein intake, decreasing simple carbohydrates, increasing vegetables, increase H2O intake, increase high fiber foods, no skipping meals, meal planning and cooking strategies, better snacking choices, emotional eating strategies , avoiding temptations, and planning for success. We discussed various medication options to help [place]  with her weight loss efforts and we both agreed to continue Mounjaro  10 mg weekly and continue to work on nutritional and behavioral strategies to promote weight loss.  .  Return in about 4 weeks (around 09/04/2023).SABRA She was informed of the importance of frequent follow up visits to maximize her success with intensive lifestyle modifications for her multiple health conditions.  Attestation Statements:   Reviewed by clinician on day of visit: allergies, medications, problem list, medical history, surgical history, family history, social history, and previous  encounter notes.   Time spent on visit including pre-visit chart review and post-visit care and charting was 35 minutes.    Noelle Sease, PA-C

## 2023-08-10 ENCOUNTER — Other Ambulatory Visit: Payer: Self-pay | Admitting: Family Medicine

## 2023-08-10 DIAGNOSIS — M5136 Other intervertebral disc degeneration, lumbar region with discogenic back pain only: Secondary | ICD-10-CM

## 2023-08-10 DIAGNOSIS — M545 Low back pain, unspecified: Secondary | ICD-10-CM

## 2023-08-12 ENCOUNTER — Telehealth (INDEPENDENT_AMBULATORY_CARE_PROVIDER_SITE_OTHER): Payer: Self-pay | Admitting: Physician Assistant

## 2023-08-12 NOTE — Telephone Encounter (Signed)
 Pt reports her prescription for Leah Lawson has not been sent to the pharmacy. Please follow up with pt.

## 2023-08-13 ENCOUNTER — Ambulatory Visit (HOSPITAL_BASED_OUTPATIENT_CLINIC_OR_DEPARTMENT_OTHER): Payer: Medicare Other | Admitting: Pulmonary Disease

## 2023-08-14 NOTE — Telephone Encounter (Signed)
 Pt called stating she was asked via MyChart if she has a new insurance because a prior authorization was denied. The new insurance is [employer] and is in her chart. Pt asks that someone calls her to follow up.

## 2023-08-18 NOTE — Telephone Encounter (Signed)
 Resubmitted new insurance information thru cover my meds 08/14/23,waiting on approval,updated patient.

## 2023-08-21 ENCOUNTER — Other Ambulatory Visit: Payer: Self-pay | Admitting: Pulmonary Disease

## 2023-08-24 ENCOUNTER — Other Ambulatory Visit (INDEPENDENT_AMBULATORY_CARE_PROVIDER_SITE_OTHER): Payer: Self-pay | Admitting: Physician Assistant

## 2023-08-24 DIAGNOSIS — E1169 Type 2 diabetes mellitus with other specified complication: Secondary | ICD-10-CM

## 2023-08-26 DIAGNOSIS — H25812 Combined forms of age-related cataract, left eye: Secondary | ICD-10-CM | POA: Diagnosis not present

## 2023-08-26 DIAGNOSIS — G4733 Obstructive sleep apnea (adult) (pediatric): Secondary | ICD-10-CM | POA: Diagnosis not present

## 2023-09-03 ENCOUNTER — Ambulatory Visit (INDEPENDENT_AMBULATORY_CARE_PROVIDER_SITE_OTHER): Payer: Medicare Other | Admitting: Physician Assistant

## 2023-09-03 ENCOUNTER — Encounter (INDEPENDENT_AMBULATORY_CARE_PROVIDER_SITE_OTHER): Payer: Self-pay | Admitting: Physician Assistant

## 2023-09-03 ENCOUNTER — Telehealth (INDEPENDENT_AMBULATORY_CARE_PROVIDER_SITE_OTHER): Payer: Self-pay | Admitting: *Deleted

## 2023-09-03 VITALS — BP 126/77 | HR 79 | Temp 98.1°F | Ht 62.0 in | Wt 227.0 lb

## 2023-09-03 DIAGNOSIS — E785 Hyperlipidemia, unspecified: Secondary | ICD-10-CM

## 2023-09-03 DIAGNOSIS — Z5320 Procedure and treatment not carried out because of patient's decision for unspecified reasons: Secondary | ICD-10-CM

## 2023-09-03 DIAGNOSIS — E1159 Type 2 diabetes mellitus with other circulatory complications: Secondary | ICD-10-CM | POA: Diagnosis not present

## 2023-09-03 DIAGNOSIS — E1169 Type 2 diabetes mellitus with other specified complication: Secondary | ICD-10-CM | POA: Diagnosis not present

## 2023-09-03 DIAGNOSIS — Z7985 Long-term (current) use of injectable non-insulin antidiabetic drugs: Secondary | ICD-10-CM

## 2023-09-03 DIAGNOSIS — E119 Type 2 diabetes mellitus without complications: Secondary | ICD-10-CM

## 2023-09-03 DIAGNOSIS — I152 Hypertension secondary to endocrine disorders: Secondary | ICD-10-CM | POA: Diagnosis not present

## 2023-09-03 DIAGNOSIS — Z6841 Body Mass Index (BMI) 40.0 and over, adult: Secondary | ICD-10-CM

## 2023-09-03 DIAGNOSIS — Z7984 Long term (current) use of oral hypoglycemic drugs: Secondary | ICD-10-CM

## 2023-09-03 DIAGNOSIS — G4733 Obstructive sleep apnea (adult) (pediatric): Secondary | ICD-10-CM

## 2023-09-03 MED ORDER — TIRZEPATIDE 10 MG/0.5ML ~~LOC~~ SOAJ: 10.0000 mg | SUBCUTANEOUS | 0 refills | Status: DC

## 2023-09-03 MED ORDER — TIRZEPATIDE 12.5 MG/0.5ML ~~LOC~~ SOAJ
12.5000 mg | SUBCUTANEOUS | 0 refills | Status: DC
Start: 2023-09-03 — End: 2023-10-09

## 2023-09-03 NOTE — Telephone Encounter (Signed)
Patient has been approved for the [facility] 10 mg/0.5 ml solution for part D eligibility,coverage starts 09/03/2023-09/02/24. Patient is aware.

## 2023-09-03 NOTE — Progress Notes (Signed)
SUBJECTIVE:  Chief Complaint: Obesity  Interim History: She has maintained her weight since last visit.  Bio impedence scale reviewed with the patient:  Muscle mass + 3.6 lbs Adipose mass - 3.6 lbs Total body water  unchanged.   Down 14 lbs overall TBW loss of 5.8%  Leah Lawson is a 68 year old female with obesity who presents for follow-up of her obesity treatment plan.  She is undergoing treatment for obesity and has been on Mounjaro 10 mg weekly for type 2 diabetes management. Her weight has not changed significantly, but she has gained muscle mass and lost adipose. Muscle mass increased from 110 pounds to 113.6 pounds, while adipose decreased from 111.4 pounds to 107.8 pounds. She is pleased with the muscle gain and notes that her clothes fit better, although there is no significant change in her stomach size. No constipation, and she reports regular bowel movements.  She is on losartan hydrochlorothiazide 100/25 mg daily for blood pressure control and is prescribed Crestor 10 mg every Monday, Wednesday, and Friday for cholesterol management. However, she is not taking Crestor as prescribed.  Her diet includes yogurt with fruit for breakfast, chicken or fish with vegetables for lunch, and chicken with peas for dinner. She has reduced snack intake and drinks water throughout the day but less so after dinner. She mentions difficulty maintaining her walking routine due to childcare responsibilities but plans to resume it. Leah Lawson is here to discuss her progress with her obesity treatment plan. She is on the Category 2 Plan and states she is following her eating plan approximately 90 % of the time. She states she is exercising walking 30 minutes 4 times per week.   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 Concerned about abdominal fat. Compliant with exercise and diet but struggles with walking routine due to caregiving. Discussed benefits of muscle mass and systemic fadipose loss. Consider increasing Mounjaro dosage for weight loss and glycemic control. - Increase Mounjaro to 12.5 mg weekly - Encourage current diet and exercise regimen - Follow-up in 5 weeks to assess response to increased Mounjaro dosage  Type 2 Diabetes Mellitus Managed on Mounjaro 10 mg weekly and Farxiga 5 mg daily. Good adherence. Increasing Mounjaro to 12.5 mg weekly for better glycemic control and weight loss. Emphasized monitoring blood glucose and hydration. Lab Results  Component Value Date   HGBA1C 6.1 04/30/2023   HGBA1C 7.5 (H) 11/28/2022   HGBA1C 7.7 (H) 08/14/2022   Lab Results  Component Value Date   MICROALBUR <0.7 08/14/2022   LDLCALC 114 (H) 04/30/2023   CREATININE 0.98 04/30/2023    - Continue [name]  5 mg daily - Increase Mounjaro to 12.5 mg weekly She is working  on nutrition plan to decrease simple carbohydrates, increase lean proteins and exercise to promote weight loss and improve glycemic control .   Hypertension Managed on losartan-hydrochlorothiazide 100-25 mg daily. No new issues discussed. BP in goal range consistently.  - Continue losartan-hydrochlorothiazide 100-25 mg  daily Continue to work on nutrition plan to promote weight loss and improve BP control.   Hyperlipidemia Non-compliant with Crestor 10 mg every Monday, Wednesday, and Friday. She is on [facility] which should reduce CV risks.  Last lipids Lab Results  Component Value Date   CHOL 175 04/30/2023   HDL 37.80 (L) 04/30/2023   LDLCALC 114 (H) 04/30/2023   TRIG 115.0 04/30/2023   CHOLHDL 5 04/30/2023   Continue Mounjaro as above and Continue to work on Engineer, technical sales -decreasing simple carbohydrates, increasing lean proteins, decreasing saturated fats and cholesterol , avoiding trans fats and exercise as able to promote weight loss, improve lipids and decrease cardiovascular risks.   Obstructive Sleep Apnea Uses CPAP. Upcoming appointment with pulmonologist and potential sleep test. - Continue CPAP therapy - Follow-up with pulmonologist on March 5th for potential sleep test  Cataract Surgery Upcoming cataract surgery. Reports she was advised to temporarily discontinue Farxiga to avoid complications but okay to continue [facility] peri-operatively. - Temporarily discontinue [name] around the time of cataract surgery  Follow-up - Follow-up appointment on March 6th at 10:30 AM.  Vitals Temp: 98.1 F (36.7 C) BP: 126/77 Pulse Rate: 79 SpO2: 98 %   Anthropometric Measurements Height: 5\' 2"  (1.575 m) Weight: 227 lb (103 kg) BMI (Calculated): 41.51 Weight at Last Visit: 227 lb Weight Lost Since Last Visit: 0 Weight Gained Since Last Visit: 0 Starting Weight: 241 lb Total Weight Loss (lbs): 14 lb (6.35 kg) Peak Weight: 250 lb   Body Composition  Body Fat %: 47.4 % Fat Mass (lbs): 107.8 lbs Muscle Mass (lbs): 113.6 lbs Total Body Water (lbs): 76.6 lbs Visceral Fat Rating : 17   Other Clinical Data Fasting: No Labs: no Today's Visit #: 12 Starting Date: 11/27/22     ASSESSMENT AND PLAN:  Diet: [name] is currently in the action stage of change. As such, her goal is  to continue with weight loss efforts. She has agreed to Category 2 Plan.  Exercise: [name] has been instructed to continue exercising as is for weight loss and overall health benefits.   Behavior Modification:  We discussed the following Behavioral Modification Strategies today: increasing lean protein intake, decreasing simple carbohydrates, increasing vegetables, increase H2O intake, increase high fiber foods, no skipping meals, meal planning and cooking strategies, avoiding temptations, and planning for success. We discussed various medication options to help Pama with her weight loss efforts and we both agreed to increase Mounjaro to 12.5 mg weekly and continue to work on nutritional and behavioral strategies to promote weight loss.   .  Return in about 5 weeks (around 10/08/2023).[name] She was informed of the importance of frequent follow up visits to maximize her success with intensive lifestyle modifications for her multiple health conditions.  Attestation Statements:   Reviewed by clinician on day of visit: allergies, medications, problem list, medical history, surgical history, family history, social history, and previous encounter notes.   Time spent on visit including pre-visit chart review and post-visit care and charting was 50 minutes.    Riham Polyakov, PA-C

## 2023-09-04 DIAGNOSIS — H25812 Combined forms of age-related cataract, left eye: Secondary | ICD-10-CM | POA: Diagnosis not present

## 2023-09-09 ENCOUNTER — Other Ambulatory Visit: Payer: Self-pay | Admitting: Family Medicine

## 2023-09-09 DIAGNOSIS — M545 Low back pain, unspecified: Secondary | ICD-10-CM

## 2023-09-09 DIAGNOSIS — M5136 Other intervertebral disc degeneration, lumbar region with discogenic back pain only: Secondary | ICD-10-CM

## 2023-09-17 ENCOUNTER — Other Ambulatory Visit: Payer: Self-pay | Admitting: Family Medicine

## 2023-09-17 DIAGNOSIS — E119 Type 2 diabetes mellitus without complications: Secondary | ICD-10-CM

## 2023-09-26 DIAGNOSIS — G4733 Obstructive sleep apnea (adult) (pediatric): Secondary | ICD-10-CM | POA: Diagnosis not present

## 2023-10-02 ENCOUNTER — Other Ambulatory Visit (HOSPITAL_BASED_OUTPATIENT_CLINIC_OR_DEPARTMENT_OTHER): Payer: Self-pay | Admitting: Cardiovascular Disease

## 2023-10-04 ENCOUNTER — Other Ambulatory Visit: Payer: Self-pay | Admitting: Gastroenterology

## 2023-10-05 ENCOUNTER — Other Ambulatory Visit: Payer: Self-pay | Admitting: Family Medicine

## 2023-10-05 DIAGNOSIS — M545 Low back pain, unspecified: Secondary | ICD-10-CM

## 2023-10-05 DIAGNOSIS — M5136 Other intervertebral disc degeneration, lumbar region with discogenic back pain only: Secondary | ICD-10-CM

## 2023-10-07 ENCOUNTER — Other Ambulatory Visit: Payer: Self-pay | Admitting: Gastroenterology

## 2023-10-08 ENCOUNTER — Encounter (HOSPITAL_BASED_OUTPATIENT_CLINIC_OR_DEPARTMENT_OTHER): Payer: Self-pay | Admitting: Pulmonary Disease

## 2023-10-08 ENCOUNTER — Ambulatory Visit (HOSPITAL_BASED_OUTPATIENT_CLINIC_OR_DEPARTMENT_OTHER): Payer: Medicare Other | Admitting: Pulmonary Disease

## 2023-10-08 VITALS — BP 124/78 | HR 88 | Ht 61.0 in | Wt 227.8 lb

## 2023-10-08 DIAGNOSIS — G4733 Obstructive sleep apnea (adult) (pediatric): Secondary | ICD-10-CM | POA: Diagnosis not present

## 2023-10-08 DIAGNOSIS — D751 Secondary polycythemia: Secondary | ICD-10-CM

## 2023-10-08 DIAGNOSIS — J449 Chronic obstructive pulmonary disease, unspecified: Secondary | ICD-10-CM | POA: Diagnosis not present

## 2023-10-08 DIAGNOSIS — G4734 Idiopathic sleep related nonobstructive alveolar hypoventilation: Secondary | ICD-10-CM

## 2023-10-08 NOTE — Progress Notes (Signed)
 Subjective:    Patient ID: Leah Lawson, female    DOB: [birth year], 68 y.o.   MRN: [id number]  HPI   68 yo ex-smoker for follow-up of COPD & OSA with nocturnal hypoxia -on CPAP + 4 L O2 She smoked about a pack per day for 40 years until she quit in 2019.  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 She reports that she has been maintaining her COPD with Anoro and using a CPAP machine for sleep apnea. She has experienced significant weight loss, losing twenty pounds to reach a current weight of 227 pounds. The patient reports some breathing difficulties, particularly after using her inhaler, and has concerns about the effects of her CPAP machine, including congestion in the ears and redness in the eyes. She also reports occasional episodes of feeling like she needs to take a deep breath, which she describes as her body "skipping a beat." The patient is interested in potentially discontinuing the use of her CPAP machine, as she has read that weight loss can improve sleep apnea. However, she acknowledges that her sleep apnea was severe, with sixty events per hour, and understands that she may still need to use the machine.  Hb 15.7 02/2023   CPAP download was reviewed which shows excellent control of events on auto settings 5 to 15 cm with average pressure 12.5 and maximum pressure of 13.8 cm.  She has a large leak from an AirFit F30 fullface mask.  She is very compliant more than 5.5 hours per night.  CPAP certainly helped improve her daytime somnolence and fatigue  Significant tests/ events reviewed  CPAP titration 02/2022  >> 7 cm + 4 L O2 HST 12/2021 AHI 62/h, lowest desat 58% PFTs 12/2020 moderate airway obstruction, ratio 57, FEV1 68%, FVC 92%, TLC 109%, DLCO 9.7/53%   05/2023 LDCT chest  >> nodules resolved from 10/2022, RADS -1  CT chest WO con 06/2020 emphysema , thyroid nodule   Review of Systems neg for any significant sore throat, dysphagia, itching, sneezing, nasal congestion or excess/ purulent secretions, fever, chills, sweats, unintended wt loss, pleuritic or exertional cp, hempoptysis, orthopnea pnd or change in chronic leg swelling. Also denies presyncope, palpitations, heartburn, abdominal pain, nausea, vomiting, diarrhea or change in bowel or urinary habits, dysuria,hematuria, rash, arthralgias, visual complaints, headache, numbness weakness or ataxia.     Objective:   Physical Exam  Gen. Pleasant, obese, in no distress ENT - no lesions, no post nasal drip Neck: No JVD, no thyromegaly, no carotid bruits Lungs: no use of accessory muscles, no dullness to percussion, decreased without rales or rhonchi  Cardiovascular: Rhythm regular, heart sounds  normal, no murmurs or gallops, no peripheral edema Musculoskeletal: No deformities, no cyanosis or clubbing , no tremors       Assessment & Plan:   Chronic Obstructive Pulmonary Disease (COPD) She is maintained on Anoro with improved breathing and no recent respiratory infections. Anoro effectively manages symptoms, though she experiences exertional dyspnea, possibly related to COPD or post-COVID-19 effects. - Continue Anoro inhaler as prescribed - Consider further evaluation if dyspnea worsens  Obstructive Sleep Apnea (OSA) Severe OSA with prior 60 events per hour. Weight loss may aid improvement, but CPAP remains necessary. Regular CPAP use reduces apnea events to less than one per hour.  Even with 50% improvement, apnea remains significant as normal is less than 5 events per hour. 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 Red blood  cell count improved, with hematology follow-up in July for reassessment. - Recheck oxygen levels to determine the need for continued oxygen therapy - Follow up with hematologist in July to reassess red blood cell count

## 2023-10-08 NOTE — Patient Instructions (Signed)
 X recheck nocturnal oximetry on CPAP/room air to see if you still need the oxygen  Congratulations on weight loss  Continue on Anoro

## 2023-10-09 ENCOUNTER — Ambulatory Visit (INDEPENDENT_AMBULATORY_CARE_PROVIDER_SITE_OTHER): Payer: Medicare Other | Admitting: Physician Assistant

## 2023-10-09 ENCOUNTER — Encounter (INDEPENDENT_AMBULATORY_CARE_PROVIDER_SITE_OTHER): Payer: Self-pay | Admitting: Physician Assistant

## 2023-10-09 VITALS — BP 98/64 | HR 86 | Temp 97.9°F | Ht 62.0 in | Wt 226.0 lb

## 2023-10-09 DIAGNOSIS — E1159 Type 2 diabetes mellitus with other circulatory complications: Secondary | ICD-10-CM

## 2023-10-09 DIAGNOSIS — E785 Hyperlipidemia, unspecified: Secondary | ICD-10-CM | POA: Diagnosis not present

## 2023-10-09 DIAGNOSIS — I152 Hypertension secondary to endocrine disorders: Secondary | ICD-10-CM

## 2023-10-09 DIAGNOSIS — Z6841 Body Mass Index (BMI) 40.0 and over, adult: Secondary | ICD-10-CM

## 2023-10-09 DIAGNOSIS — Z5320 Procedure and treatment not carried out because of patient's decision for unspecified reasons: Secondary | ICD-10-CM

## 2023-10-09 DIAGNOSIS — E669 Obesity, unspecified: Secondary | ICD-10-CM

## 2023-10-09 DIAGNOSIS — G4733 Obstructive sleep apnea (adult) (pediatric): Secondary | ICD-10-CM

## 2023-10-09 DIAGNOSIS — Z7985 Long-term (current) use of injectable non-insulin antidiabetic drugs: Secondary | ICD-10-CM

## 2023-10-09 DIAGNOSIS — E1169 Type 2 diabetes mellitus with other specified complication: Secondary | ICD-10-CM

## 2023-10-09 MED ORDER — TIRZEPATIDE 12.5 MG/0.5ML ~~LOC~~ SOAJ
12.5000 mg | SUBCUTANEOUS | 0 refills | Status: DC
Start: 2023-10-09 — End: 2023-11-06

## 2023-10-09 NOTE — Progress Notes (Signed)
 SUBJECTIVE: Discussed the use of AI scribe software for clinical note transcription with the patient, who gave verbal consent to proceed.  Chief Complaint: Obesity  Interim History: She is down 1 lb since her last visit.  Down 15 lbs overall from 11/27/22 (and ~ 25 lbs down from peak weight of 251 lbs) TBW loss of 6.22% ~ 6 lbs muscle loss ~ 10 lbs adipose loss Nina is here to discuss her progress with her obesity treatment plan. She is on the Category 2 Plan and states she is following her eating plan approximately 80 % of the time. She states she is not exercising 0 minutes 0 times per week.  Anadalay Hach is a 68 year old female who presents for a follow-up on her obesity treatment plan.  She has been on a weight loss journey, losing over 25 pounds since July 2023, with her weight decreasing from a peak of 251 pounds to 226 pounds currently. She aims to reach a target weight of 200 pounds and has lost one pound since her last visit.   Her current medications include Mounjaro 12.5 mg weekly, Farxiga 5 mg daily, Hyzaar 100/25 mg daily, Prilosec 20 mg daily, Crestor 10 mg on Monday, Wednesday, and Friday, and spironolactone 25 mg daily. There have been no changes in her medication regimen.  She recently underwent cataract surgeries and is scheduled for carpal tunnel surgery next week. Following cataract surgery, she experienced a decrease in physical activity, which may have contributed to a slight loss of muscle mass. Despite this, she has been trying to maintain her protein intake.  She does not regularly check her blood sugars and has not reported any symptoms of hypoglycemia. No hunger or cravings, and she is not skipping meals. Her blood pressure was noted to be 98/64 mmHg, but she does not experience dizziness or lightheadedness.   OBJECTIVE: Visit Diagnoses: Problem List Items Addressed This Visit     Morbid obesity (HCC) with starting BMI 44   Relevant Medications    tirzepatide (MOUNJARO) 12.5 MG/0.5ML Pen   Hyperlipidemia associated with type 2 diabetes mellitus (HCC)   Relevant Medications   tirzepatide (MOUNJARO) 12.5 MG/0.5ML Pen   Statin declined   Type 2 diabetes mellitus with obesity (HCC) - Primary   Relevant Medications   tirzepatide (MOUNJARO) 12.5 MG/0.5ML Pen   OSA on CPAP   BMI 40.0-44.9, adult (HCC)   Relevant Medications   tirzepatide (MOUNJARO) 12.5 MG/0.5ML Pen   Other Visit Diagnoses       Hypertension associated with diabetes (HCC)       Relevant Medications   tirzepatide (MOUNJARO) 12.5 MG/0.5ML Pen     Obesity She has lost over 25 pounds since July 2023, with a current weight of 226 pounds, down from a peak of 251 pounds.  She is on Mounjaro 12.5 mg weekly, and Farxiga 5 mg daily.   She aims to reach a weight of 200 pounds. There is a slight loss of muscle mass, likely due to decreased activity following cataract surgery. Discussed metabolism decreases with age, particularly around the mid-forties and again around sixty, making weight loss more challenging. - Continue Mounjaro 12.5 mg weekly - Encourage increased physical activity post-surgery when able - Maintain protein intake - Follow up in 4 weeks to assess progress  Type 2 Diabetes Mellitus She is on Mounjaro 12.5 mg weekly and Farxiga 5 mg daily. Discussed potential future increase of Farxiga, depending on primary care provider's assessment. She does not check blood sugars  at home, which is acceptable given her current regimen. Mounjaro, containing GLP and GIP medicine, enhances insulin efficacy. Lab Results  Component Value Date   HGBA1C 6.1 04/30/2023   HGBA1C 7.5 (H) 11/28/2022   HGBA1C 7.7 (H) 08/14/2022   Lab Results  Component Value Date   MICROALBUR <0.7 08/14/2022   LDLCALC 114 (H) 04/30/2023   CREATININE 0.98 04/30/2023   She is working  on nutrition plan to decrease simple carbohydrates, increase lean proteins and exercise to promote weight loss and  improve glycemic control . Meds ordered this encounter  Medications   tirzepatide (MOUNJARO) 12.5 MG/0.5ML Pen    Sig: Inject 12.5 mg into the skin once a week.    Dispense:  6 mL    Refill:  0  She has her AWV with her PCP soon. PCP prescribes [name] and other medications.    Hypertension Blood pressure is 98/64 mmHg, lower than usual. Discussed potential reduction of antihypertensive medication if low readings persist. She is on Hyzaar 100/25 mg daily and spironolactone 25 mg daily.  Mounjaro may contribute to lower blood pressure, and medication adjustments may be considered if low readings continue. BP Readings from Last 3 Encounters:  10/09/23 98/64  10/08/23 124/78  09/03/23 126/77    - Continue Hyzaar 100/25 mg daily - Continue spironolactone 25 mg daily - Monitor blood pressure and consider medication adjustment if low readings persist  Hypercholesterolemia She is prescribed Crestor 10 mg three times a week. Mentioned discussing alternative treatments with primary care provider in April. She has been resistant to taking statin medication.  She would like to consider alternative medications at her next visit with her PCP or Cardiology.   Carpal Tunnel Syndrome Scheduled for carpal tunnel surgery next week under conscious sedation with a nerve block. She dislikes the nerve block but understands its necessity. The surgery is expected to take about 30 minutes. - Proceed with carpal tunnel surgery as scheduled  Follow-up Scheduled to see primary care provider on March 14th for yearly appointment /lab work. Follow-up in four weeks for weight management and other health concerns. - Follow up with primary care provider on March 14th for yearly appointment/ lab work - Schedule follow-up appointment in 4 weeks on April 3rd at 10:30 AM  Vitals Temp: 97.9 F (36.6 C) BP: 98/64 Pulse Rate: 86 SpO2: 91 %   Anthropometric Measurements Height: 5\' 2"  (1.575 m) Weight: 226 lb  (102.5 kg) BMI (Calculated): 41.33 Weight at Last Visit: 227 lb Weight Lost Since Last Visit: 1 lb Weight Gained Since Last Visit: 0 Starting Weight: 241 lb Total Weight Loss (lbs): 15 lb (6.804 kg) Peak Weight: 250 lb   Body Composition  Body Fat %: 48.6 % Fat Mass (lbs): 110 lbs Muscle Mass (lbs): 110.6 lbs Total Body Water (lbs): 79.6 lbs Visceral Fat Rating : 17   Other Clinical Data Fasting: no Labs: no Today's Visit #: 13 Starting Date: 11/27/22     ASSESSMENT AND PLAN:  Diet: Jamisyn is currently in the action stage of change. As such, her goal is to continue with weight loss efforts and has agreed to the Category 2 Plan.   Exercise:  Older adults should follow the adult guidelines. When older adults cannot meet the adult guidelines, they should be as physically active as their abilities and conditions will allow., Older adults should do exercises that maintain or improve balance if they are at risk of falling. , and Older adults should determine their level of effort for  physical activity relative to their level of fitness.  Behavior Modification:  We discussed the following Behavioral Modification Strategies today: increasing lean protein intake, decreasing simple carbohydrates, increasing vegetables, increase H2O intake, increase high fiber foods, no skipping meals, better snacking choices, avoiding temptations, and planning for success. We discussed various medication options to help Zekiah with her weight loss efforts and we both agreed to continue Mounjaro 12.5 mg weekly and Farxiga 5 mg daily for Type 2 diabetes. She has follow up with her PCP and expects to have labs done then. .  Return in about 4 weeks (around 11/06/2023).[name] She was informed of the importance of frequent follow up visits to maximize her success with intensive lifestyle modifications for her multiple health conditions.  Attestation Statements:   Reviewed by clinician on day of visit:  allergies, medications, problem list, medical history, surgical history, family history, social history, and previous encounter notes.   Time spent on visit including pre-visit chart review and post-visit care and charting was 34 minutes  Darcia Lampi,PA-C

## 2023-10-16 ENCOUNTER — Ambulatory Visit: Payer: Medicare Other | Admitting: Family Medicine

## 2023-10-17 ENCOUNTER — Ambulatory Visit (INDEPENDENT_AMBULATORY_CARE_PROVIDER_SITE_OTHER): Payer: Medicare Other | Admitting: Family Medicine

## 2023-10-17 ENCOUNTER — Encounter: Payer: Self-pay | Admitting: Family Medicine

## 2023-10-17 VITALS — BP 130/84 | HR 85 | Temp 97.8°F | Ht 62.0 in | Wt 228.0 lb

## 2023-10-17 DIAGNOSIS — I10 Essential (primary) hypertension: Secondary | ICD-10-CM | POA: Diagnosis not present

## 2023-10-17 DIAGNOSIS — E785 Hyperlipidemia, unspecified: Secondary | ICD-10-CM | POA: Diagnosis not present

## 2023-10-17 DIAGNOSIS — E669 Obesity, unspecified: Secondary | ICD-10-CM | POA: Diagnosis not present

## 2023-10-17 DIAGNOSIS — Z7984 Long term (current) use of oral hypoglycemic drugs: Secondary | ICD-10-CM | POA: Diagnosis not present

## 2023-10-17 DIAGNOSIS — Z6841 Body Mass Index (BMI) 40.0 and over, adult: Secondary | ICD-10-CM

## 2023-10-17 DIAGNOSIS — N1831 Chronic kidney disease, stage 3a: Secondary | ICD-10-CM

## 2023-10-17 DIAGNOSIS — E559 Vitamin D deficiency, unspecified: Secondary | ICD-10-CM | POA: Diagnosis not present

## 2023-10-17 DIAGNOSIS — I700 Atherosclerosis of aorta: Secondary | ICD-10-CM | POA: Diagnosis not present

## 2023-10-17 DIAGNOSIS — E1169 Type 2 diabetes mellitus with other specified complication: Secondary | ICD-10-CM

## 2023-10-17 DIAGNOSIS — J432 Centrilobular emphysema: Secondary | ICD-10-CM

## 2023-10-17 LAB — COMPREHENSIVE METABOLIC PANEL
ALT: 17 U/L (ref 0–35)
AST: 19 U/L (ref 0–37)
Albumin: 4.5 g/dL (ref 3.5–5.2)
Alkaline Phosphatase: 63 U/L (ref 39–117)
BUN: 25 mg/dL — ABNORMAL HIGH (ref 6–23)
CO2: 27 meq/L (ref 19–32)
Calcium: 9.7 mg/dL (ref 8.4–10.5)
Chloride: 103 meq/L (ref 96–112)
Creatinine, Ser: 0.83 mg/dL (ref 0.40–1.20)
GFR: 72.34 mL/min (ref >60.00)
Glucose, Bld: 82 mg/dL (ref 70–99)
Potassium: 4.2 meq/L (ref 3.5–5.1)
Sodium: 140 meq/L (ref 135–145)
Total Bilirubin: 0.6 mg/dL (ref 0.2–1.2)
Total Protein: 7.5 g/dL (ref 6.0–8.3)

## 2023-10-17 LAB — LIPID PANEL
Cholesterol: 181 mg/dL (ref 0–200)
HDL: 43.1 mg/dL (ref >39.00)
LDL Cholesterol: 120 mg/dL — ABNORMAL HIGH (ref 0–99)
NonHDL: 137.88
Total CHOL/HDL Ratio: 4
Triglycerides: 88 mg/dL (ref 0.0–149.0)
VLDL: 17.6 mg/dL (ref 0.0–40.0)

## 2023-10-17 LAB — CBC
HCT: 48.7 % — ABNORMAL HIGH (ref 36.0–46.0)
Hemoglobin: 16.2 g/dL — ABNORMAL HIGH (ref 12.0–15.0)
MCHC: 33.2 g/dL (ref 30.0–36.0)
MCV: 92.9 fl (ref 78.0–100.0)
Platelets: 256 10*3/uL (ref 150.0–400.0)
RBC: 5.24 Mil/uL — ABNORMAL HIGH (ref 3.87–5.11)
RDW: 13.6 % (ref 11.5–15.5)
WBC: 5.8 10*3/uL (ref 4.0–10.5)

## 2023-10-17 LAB — MICROALBUMIN / CREATININE URINE RATIO
Creatinine,U: 107.1 mg/dL
Microalb Creat Ratio: UNDETERMINED mg/g (ref 0.0–30.0)
Microalb, Ur: <0.7 mg/dL

## 2023-10-17 LAB — VITAMIN D 25 HYDROXY (VIT D DEFICIENCY, FRACTURES): VITD: 21.05 ng/mL — ABNORMAL LOW (ref 30.00–100.00)

## 2023-10-17 LAB — HEMOGLOBIN A1C: Hgb A1c MFr Bld: 6.2 % (ref 4.6–6.5)

## 2023-10-17 MED ORDER — DAPAGLIFLOZIN PROPANEDIOL 10 MG PO TABS: 10.0000 mg | ORAL_TABLET | Freq: Every day | ORAL | 1 refills | Status: DC

## 2023-10-17 NOTE — Progress Notes (Signed)
 Subjective:     Patient ID: Leah Lawson, female    DOB: Jan 26, 1956, 68 y.o.   MRN: [id number]  Chief Complaint  Patient presents with   Medical Management of Chronic Issues    4 month f/u    HPI   History of Present Illness          Here to follow up on chronic health conditions   HLD with elevated ASCVD risk - does not tolerate statins.  She is seeing cardiologist, Dr. Judithe Modest low back pain. Similar pain that she saw [facility] for and she has not followed up.   Emphysema - under the care of Dr. Vassie Loll, pulmonology   She recently saw GI and is taking omeprazole   Requests handicap placard  Health Maintenance Due  Topic Date Due   OPHTHALMOLOGY EXAM  02/20/2023   COVID-19 Vaccine (5 - 2024-25 season) 04/06/2023   Colonoscopy  01/29/2024    Past Medical History:  Diagnosis Date   Arthritis    Asthma    Back pain    Cataract    Chronic kidney disease    Closed fracture of distal end of left radius 11/15/2022   Constipation    COPD (chronic obstructive pulmonary disease) (HCC)    COPD (chronic obstructive pulmonary disease) (HCC)    Diabetes (HCC)    Diabetes mellitus without complication (HCC)    "pre-diabetic"   Emphysema of lung (HCC)    Glaucoma    Heartburn    High cholesterol    Hypertension    Hypertensive retinopathy 10/26/2021   Lactose intolerance    Open-angle glaucoma 10/26/2021   Other specified disorders of thyroid    Sleep apnea    SOB (shortness of breath)    Vitamin D deficiency     Past Surgical History:  Procedure Laterality Date   BREAST SURGERY     WRIST SURGERY Left 11/18/2022    Family History  Problem Relation Age of Onset   Heart failure Mother    Arthritis Mother    Diabetes Mother    High blood pressure Mother    Colon polyps Father    Colon cancer Father    Cancer Father        prostate, lung   COPD Father    High Cholesterol Father    High blood pressure Father    Dementia Father    Diabetes  Brother    High blood pressure Brother    Stroke Brother    Cancer Maternal Grandfather    Miscarriages / Stillbirths Daughter    Breast cancer Cousin    Esophageal cancer Neg Hx    Liver disease Neg Hx    Rectal cancer Neg Hx    Stomach cancer Neg Hx     Social History   Socioeconomic History   Marital status: Single    Spouse name: Not on file   Number of children: 4   Years of education: Not on file   Highest education level: Master's degree (e.g., MA, MS, MEng, MEd, MSW, MBA)  Occupational History   Occupation: retired  Tobacco Use   Smoking status: Former    Current packs/day: 0.00    Average packs/day: 0.8 packs/day for 46.0 years (34.5 ttl pk-yrs)    Types: Cigarettes    Start date: 05/05/1972    Quit date: 05/05/2018    Years since quitting: 5.4    Passive exposure: Past   Smokeless tobacco: Never  Vaping Use  Vaping status: Never Used  Substance and Sexual Activity   Alcohol use: Not Currently   Drug use: Never   Sexual activity: Not on file  Other Topics Concern   Not on file  Social History Narrative   Not on file   Social Drivers of Health   Financial Resource Strain: Low Risk  (10/10/2023)   Overall Financial Resource Strain (CARDIA)    Difficulty of Paying Living Expenses: Not very hard  Food Insecurity: No Food Insecurity (10/10/2023)   Hunger Vital Sign    Worried About Running Out of Food in the Last Year: Never true    Ran Out of Food in the Last Year: Never true  Transportation Needs: No Transportation Needs (10/10/2023)   PRAPARE - Administrator, Civil Service (Medical): No    Lack of Transportation (Non-Medical): No  Physical Activity: Insufficiently Active (10/10/2023)   Exercise Vital Sign    Days of Exercise per Week: 3 days    Minutes of Exercise per Session: 20 min  Stress: No Stress Concern Present (10/10/2023)   [employer] of Occupational Health - Occupational Stress Questionnaire    Feeling of Stress : Not at all   Social Connections: Moderately Integrated (10/10/2023)   Social Connection and Isolation Panel [NHANES]    Frequency of Communication with Friends and Family: Three times a week    Frequency of Social Gatherings with Friends and Family: Once a week    Attends Religious Services: More than 4 times per year    Active Member of [employer] or Organizations: Yes    Attends Engineer, structural: More than 4 times per year    Marital Status: Never married  Intimate Partner Violence: Not At Risk (07/11/2023)   Humiliation, Afraid, Rape, and Kick questionnaire    Fear of Current or Ex-Partner: No    Emotionally Abused: No    Physically Abused: No    Sexually Abused: No    Outpatient Medications Prior to Visit  Medication Sig Dispense Refill   clotrimazole-betamethasone (LOTRISONE) cream Apply 1 Application topically daily. 30 g 0   COD LIVER OIL PO Take by mouth.     losartan-hydrochlorothiazide (HYZAAR) 100-25 MG tablet Take 1 tablet by mouth once daily 90 tablet 3   meloxicam (MOBIC) 15 MG tablet Take 1 tablet by mouth once daily 30 tablet 0   omeprazole (PRILOSEC) 20 MG capsule Take 1 capsule by mouth once daily 90 capsule 0   oxyCODONE-acetaminophen (PERCOCET) 10-325 MG tablet Take 1 tablet by mouth as needed for pain.     rosuvastatin (CRESTOR) 10 MG tablet TAKE 1 TABLET MONDAY, WEDNESDAY, AND FRIDAY ONLY 40 tablet 3   spironolactone (ALDACTONE) 25 MG tablet Take 1 tablet by mouth once daily 90 tablet 0   tirzepatide (MOUNJARO) 12.5 MG/0.5ML Pen Inject 12.5 mg into the skin once a week. 6 mL 0   umeclidinium-vilanterol (ANORO ELLIPTA) 62.5-25 MCG/ACT AEPB Inhale 1 puff by mouth once daily 60 each 5   VENTOLIN HFA 108 (90 Base) MCG/ACT inhaler INHALE 2 PUFFS BY MOUTH EVERY 6 HOURS AS NEEDED FOR WHEEZING AND FOR SHORTNESS OF BREATH 18 g 3   FARXIGA 5 MG TABS tablet TAKE 1 TABLET BY MOUTH ONCE DAILY BEFORE BREAKFAST 90 tablet 0   No facility-administered medications prior to visit.     Allergies  Allergen Reactions   Hydralazine Swelling    In feet and ankle   Lisinopril Cough   Singulair [Montelukast Sodium] Other (See Comments)  headaches   Mucinex [Guaifenesin Er] Diarrhea and Rash    Review of Systems  Constitutional:  Negative for chills and fever.  Respiratory:  Negative for shortness of breath.   Cardiovascular:  Negative for chest pain, palpitations and leg swelling.  Gastrointestinal:  Negative for abdominal pain, constipation, diarrhea, nausea and vomiting.  Genitourinary:  Negative for dysuria, frequency and urgency.  Musculoskeletal:  Positive for back pain and joint pain.  Neurological:  Negative for dizziness, focal weakness and headaches.       Objective:    Physical Exam Constitutional:      General: She is not in acute distress.    Appearance: She is not ill-appearing.  Eyes:     Extraocular Movements: Extraocular movements intact.     Conjunctiva/sclera: Conjunctivae normal.  Cardiovascular:     Rate and Rhythm: Normal rate.  Pulmonary:     Effort: Pulmonary effort is normal.  Musculoskeletal:     Cervical back: Normal range of motion and neck supple.  Skin:    General: Skin is warm and dry.  Neurological:     General: No focal deficit present.     Mental Status: She is alert and oriented to person, place, and time.  Psychiatric:        Mood and Affect: Mood normal.        Behavior: Behavior normal.        Thought Content: Thought content normal.      BP 130/84 (BP Location: Left Arm, Patient Position: Sitting)   Pulse 85   Temp 97.8 F (36.6 C) (Temporal)   Ht 5\' 2"  (1.575 m)   Wt 228 lb (103.4 kg)   SpO2 93%   BMI 41.70 kg/m  Wt Readings from Last 3 Encounters:  10/17/23 228 lb (103.4 kg)  10/09/23 226 lb (102.5 kg)  10/08/23 227 lb 12.8 oz (103.3 kg)       Assessment & Plan:   Problem List Items Addressed This Visit     Aortic atherosclerosis (HCC)   Relevant Orders   Lipid panel (Completed)    Essential hypertension   Relevant Orders   CBC (Completed)   Comprehensive metabolic panel (Completed)   Hyperlipidemia associated with type 2 diabetes mellitus (HCC)   Relevant Medications   dapagliflozin propanediol (FARXIGA) 10 MG TABS tablet   Other Relevant Orders   Lipid panel (Completed)   Pulmonary emphysema (HCC)   Stage 3a chronic kidney disease (HCC)   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  Increase Farxiga to 10 mg.  Check labs for DM, lipids and vitamin D.  Urine microalbumin ordered.  Continue seeing specialists. Recommend follow up with [facility] for chronic back pain.   I have discontinued [employer]. I am also having her start on dapagliflozin propanediol. Additionally, I am having her maintain her oxyCODONE-acetaminophen, rosuvastatin, clotrimazole-betamethasone, Anoro Ellipta, Ventolin HFA, losartan-hydrochlorothiazide, COD LIVER OIL PO, spironolactone, meloxicam, omeprazole, and tirzepatide.  Meds ordered this encounter  Medications   dapagliflozin propanediol (FARXIGA) 10 MG TABS tablet    Sig: Take 1 tablet (10 mg total) by mouth daily.  Dispense:  30 tablet    Refill:  1    Supervising Provider:   Hillard Danker A [[id number]]

## 2023-10-17 NOTE — Patient Instructions (Signed)
 Please go downstairs for labs and a urine test before you leave.  I increased your [name] to 10 mg daily.  You can use the 5 mg tablets you have at home and just take 2 of those daily until are gone.  Continue working on healthy diet and exercise for weight loss.  Continue seeing your specialists as scheduled.

## 2023-10-23 ENCOUNTER — Other Ambulatory Visit: Payer: Self-pay | Admitting: Pulmonary Disease

## 2023-11-01 ENCOUNTER — Other Ambulatory Visit: Payer: Self-pay | Admitting: Family Medicine

## 2023-11-01 DIAGNOSIS — M545 Low back pain, unspecified: Secondary | ICD-10-CM

## 2023-11-01 DIAGNOSIS — M5136 Other intervertebral disc degeneration, lumbar region with discogenic back pain only: Secondary | ICD-10-CM

## 2023-11-06 ENCOUNTER — Ambulatory Visit (INDEPENDENT_AMBULATORY_CARE_PROVIDER_SITE_OTHER): Admitting: Physician Assistant

## 2023-11-06 ENCOUNTER — Encounter (INDEPENDENT_AMBULATORY_CARE_PROVIDER_SITE_OTHER): Payer: Self-pay | Admitting: Physician Assistant

## 2023-11-06 VITALS — BP 107/69 | HR 88 | Temp 98.1°F | Ht 62.0 in | Wt 222.0 lb

## 2023-11-06 DIAGNOSIS — I152 Hypertension secondary to endocrine disorders: Secondary | ICD-10-CM

## 2023-11-06 DIAGNOSIS — Z6841 Body Mass Index (BMI) 40.0 and over, adult: Secondary | ICD-10-CM

## 2023-11-06 DIAGNOSIS — Z5320 Procedure and treatment not carried out because of patient's decision for unspecified reasons: Secondary | ICD-10-CM

## 2023-11-06 DIAGNOSIS — E785 Hyperlipidemia, unspecified: Secondary | ICD-10-CM | POA: Diagnosis not present

## 2023-11-06 DIAGNOSIS — E1169 Type 2 diabetes mellitus with other specified complication: Secondary | ICD-10-CM | POA: Diagnosis not present

## 2023-11-06 DIAGNOSIS — G4733 Obstructive sleep apnea (adult) (pediatric): Secondary | ICD-10-CM

## 2023-11-06 DIAGNOSIS — E559 Vitamin D deficiency, unspecified: Secondary | ICD-10-CM

## 2023-11-06 DIAGNOSIS — Z7985 Long-term (current) use of injectable non-insulin antidiabetic drugs: Secondary | ICD-10-CM

## 2023-11-06 MED ORDER — VITAMIN D (ERGOCALCIFEROL) 1.25 MG (50000 UNIT) PO CAPS: 50000.0000 [IU] | ORAL_CAPSULE | ORAL | 0 refills | Status: DC

## 2023-11-06 MED ORDER — TIRZEPATIDE 12.5 MG/0.5ML ~~LOC~~ SOAJ: 12.5000 mg | SUBCUTANEOUS | 0 refills | Status: DC

## 2023-11-06 NOTE — Progress Notes (Unsigned)
 SUBJECTIVE: Discussed the use of AI scribe software for clinical note transcription with the patient, who gave verbal consent to proceed.  Chief Complaint: Obesity  Interim History: She is down 4 lbs since her last visit.  Down 19 lbs overall TBW loss of 7.9%  Leah Lawson is here to discuss her progress with her obesity treatment plan. She is on the Category 2 Plan and states she is following her eating plan approximately 85-90 % of the time. She states she is walking 30 minutes 5 times per week.  Leah Lawson is a 68 year old female who presents for follow-up of her obesity treatment plan.  She has lost four pounds since her last visit, totaling a loss of nineteen pounds overall. She attributes her success to using a protein shake in the morning and reducing her intake of high-fat foods like bacon. Her visceral adipose rating has decreased to sixteen, with a goal of twelve. Improvements in her triglycerides, which have decreased from 115 to 88, and her HDL cholesterol has increased from a previously low level.  Her A1c remains in the prediabetic range, though it has improved from a previous diabetic range. She is currently taking [place], with an increased dose of 10 mg daily, and Mounjaro without experiencing significant side effects. Occasional constipation is managed with a stool softener as needed. She ensures adequate hydration, especially with the increased Farxiga dosage.  Her LDL cholesterol has increased slightly. However, her HDL cholesterol has improved, and triglycerides have decreased significantly, indicating improved dietary habits.  Her vitamin D levels remain low, and she has not yet started a vitamin D supplement due to financial constraints.  Her hemoglobin and hematocrit levels are elevated, likely due to her history of smoking, which she quit four to five years ago. She has a history of COPD and is awaiting results from a recent sleep apnea test. She has been using a  machine for sleep apnea for almost a year.  She experiences occasional constipation, possibly related to [facility] or dietary factors. She manages this with a stool softener as needed and ensures adequate hydration.  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   Her visceral adipose rating has decreased to 16, with a goal of 12. Dietary changes, including using a protein shake and reducing bacon intake, contribute to her weight loss.  Encouragement is given to continue her current regimen, acknowledging the difficulty of weight loss. - Continue current dietary and exercise regimen - Reinforce reduction of high-fat foods like bacon  Prediabetes Her A1c remains in the prediabetic range, an improvement from previous diabetic levels. She is on Farxiga, recently increased to 10 mg daily, and Mounjaro, which she tolerates well without significant side effects. Reassurance is given  that her efforts are effective, and she is encouraged to continue her current management plan. - Continue Farxiga 10 mg daily - Continue Mounjaro 12.5 mg weekly - Monitor A1c levels She is working  on nutrition plan to decrease simple carbohydrates, increase lean proteins and exercise to  promote weight loss and improve glycemic control . Meds ordered this encounter  Medications   Vitamin D, Ergocalciferol, (DRISDOL) 1.25 MG (50000 UNIT) CAPS capsule    Sig: Take 1 capsule (50,000 Units total) by mouth every 7 (seven) days.    Dispense:  4 capsule    Refill:  0   tirzepatide (MOUNJARO) 12.5 MG/0.5ML Pen    Sig: Inject 12.5 mg into the skin once a week.    Dispense:  6 mL    Refill:  0    Hyperlipidemia On Crestor 10 mg- but not consistently taking. On Mounjaro 12.5 mg weekly.  Her LDL cholesterol has increased slightly, but her HDL cholesterol has improved, and triglycerides have decreased significantly from 115 to 88. These improvements are attributed to better nutrition, and continued dietary management is encouraged. Lab Results  Component Value Date   CHOL 181 10/17/2023   CHOL 175 04/30/2023   CHOL 179 11/28/2022   Lab Results  Component Value Date   HDL 43.10 10/17/2023   HDL 37.80 (L) 04/30/2023   HDL 39 (L) 11/28/2022   Lab Results  Component Value Date   LDLCALC 120 (H) 10/17/2023   LDLCALC 114 (H) 04/30/2023   LDLCALC 108 (H) 11/28/2022   Lab Results  Component Value Date   TRIG 88.0 10/17/2023   TRIG 115.0 04/30/2023   TRIG 183 (H) 11/28/2022   Lab Results  Component Value Date   CHOLHDL 4 10/17/2023   CHOLHDL 5 04/30/2023   CHOLHDL 4.6 (H) 11/28/2022   No results found for: "LDLDIRECT" Continue to work on nutrition plan -decreasing simple carbohydrates, increasing lean proteins, decreasing saturated fats and cholesterol , avoiding trans fats and exercise as able to promote weight loss, improve lipids and decrease cardiovascular risks. She has follow up with Dr. Duke Salvia soon  Vitamin D deficiency Her vitamin D levels remain low.  Last vitamin D Lab Results  Component Value Date   VD25OH 21.05 (L) 10/17/2023   She has not yet started a vitamin D supplement due to financial constraints.  - Prescribe vitamin D 50,000 units once weekly  for several months and recheck levels in 3 months Low vitamin D levels can be associated with adiposity and may result in leptin resistance and weight gain. Also associated with fatigue.  Currently on vitamin D supplementation without any adverse effects such as nausea, vomiting or muscle weakness.   OSA COPD likely contributes to elevated hemoglobin and hematocrit levels due to chronic lung changes from past smoking. She is awaiting results from a sleep apnea test, which may be related to her COPD management. - Await results from sleep apnea test  Follow-up She is scheduled for follow-up appointments to monitor her progress and manage her conditions. - Schedule follow-up appointment on April 29 at 9:30 AM - Schedule follow-up appointment on May 28 at 10:30 AM   Vitals Temp: 98.1 F (36.7 C) BP: 107/69 Pulse Rate: 88 SpO2: 96 %   Anthropometric Measurements Height: 5\' 2"  (1.575 m) Weight: 222 lb (100.7 kg) BMI (Calculated): 40.59 Weight at Last Visit: 226 lb Weight Lost Since Last Visit: 4 lb Weight Gained Since Last Visit: 0 Starting Weight: 241 lb Total Weight Loss (lbs): 19 lb (  8.618 kg) Peak Weight: 250 lb   Body Composition  Body Fat %: 47 % Fat Mass (lbs): 104.6 lbs Muscle Mass (lbs): 112 lbs Total Body Water (lbs): 76.4 lbs Visceral Fat Rating : 16   Other Clinical Data Today's Visit #: 14 Starting Date: 11/27/22     ASSESSMENT AND PLAN:  Diet: Leah Lawson is currently in the action stage of change. As such, her goal is to continue with weight loss efforts. She has agreed to Category 2 Plan.  Exercise: Leah Lawson has been instructed to work up to a goal of 150 minutes of combined cardio and strengthening exercise per week for weight loss and overall health benefits.   Behavior Modification:  We discussed the following Behavioral Modification Strategies today: increasing lean protein intake, decreasing simple carbohydrates, increasing vegetables, increase H2O  intake, increase high fiber foods, no skipping meals, meal planning and cooking strategies, keeping healthy foods in the home, avoiding temptations, and planning for success. We discussed various medication options to help Leah Lawson with her weight loss efforts and we both agreed to continue current program for medical weight loss and continue to work on nutritional and behavioral strategies to promote weight loss.  .  Return in about 4 weeks (around 12/04/2023).[name] She was informed of the importance of frequent follow up visits to maximize her success with intensive lifestyle modifications for her multiple health conditions.  Attestation Statements:   Reviewed by clinician on day of visit: allergies, medications, problem list, medical history, surgical history, family history, social history, and previous encounter notes.   Time spent on visit including pre-visit chart review and post-visit care and charting was 28 minutes.

## 2023-11-13 ENCOUNTER — Telehealth (HOSPITAL_BASED_OUTPATIENT_CLINIC_OR_DEPARTMENT_OTHER): Payer: Self-pay | Admitting: Pulmonary Disease

## 2023-11-13 NOTE — Telephone Encounter (Signed)
 CMN received from [facility]. Will fax once provider has signed off

## 2023-11-13 NOTE — Telephone Encounter (Signed)
 CMN has been signed and faxed. Nothing further needed

## 2023-11-15 ENCOUNTER — Other Ambulatory Visit: Payer: Self-pay | Admitting: Pulmonary Disease

## 2023-11-17 NOTE — Telephone Encounter (Signed)
 Dr. Simuel Ducking, please advise if okay to refill. Medication not mentioned in last OV note.

## 2023-11-29 ENCOUNTER — Other Ambulatory Visit (INDEPENDENT_AMBULATORY_CARE_PROVIDER_SITE_OTHER): Payer: Self-pay | Admitting: Physician Assistant

## 2023-11-29 DIAGNOSIS — E559 Vitamin D deficiency, unspecified: Secondary | ICD-10-CM

## 2023-12-02 ENCOUNTER — Ambulatory Visit (INDEPENDENT_AMBULATORY_CARE_PROVIDER_SITE_OTHER): Admitting: Physician Assistant

## 2023-12-02 ENCOUNTER — Other Ambulatory Visit: Payer: Self-pay | Admitting: Family Medicine

## 2023-12-02 ENCOUNTER — Encounter (INDEPENDENT_AMBULATORY_CARE_PROVIDER_SITE_OTHER): Payer: Self-pay | Admitting: Physician Assistant

## 2023-12-02 VITALS — BP 110/73 | HR 87 | Temp 97.9°F | Ht 62.0 in | Wt 226.0 lb

## 2023-12-02 DIAGNOSIS — Z6841 Body Mass Index (BMI) 40.0 and over, adult: Secondary | ICD-10-CM

## 2023-12-02 DIAGNOSIS — E1169 Type 2 diabetes mellitus with other specified complication: Secondary | ICD-10-CM

## 2023-12-02 DIAGNOSIS — E669 Obesity, unspecified: Secondary | ICD-10-CM

## 2023-12-02 DIAGNOSIS — M5136 Other intervertebral disc degeneration, lumbar region with discogenic back pain only: Secondary | ICD-10-CM

## 2023-12-02 DIAGNOSIS — Z7985 Long-term (current) use of injectable non-insulin antidiabetic drugs: Secondary | ICD-10-CM

## 2023-12-02 DIAGNOSIS — E559 Vitamin D deficiency, unspecified: Secondary | ICD-10-CM | POA: Diagnosis not present

## 2023-12-02 DIAGNOSIS — M545 Low back pain, unspecified: Secondary | ICD-10-CM

## 2023-12-02 MED ORDER — TIRZEPATIDE 12.5 MG/0.5ML ~~LOC~~ SOAJ: 12.5000 mg | SUBCUTANEOUS | 0 refills | Status: DC

## 2023-12-02 MED ORDER — VITAMIN D (ERGOCALCIFEROL) 1.25 MG (50000 UNIT) PO CAPS: 50000.0000 [IU] | ORAL_CAPSULE | ORAL | 0 refills | Status: DC

## 2023-12-02 NOTE — Progress Notes (Signed)
 SUBJECTIVE: Discussed the use of AI scribe software for clinical note transcription with the patient, who gave verbal consent to proceed.  Chief Complaint: Obesity  Interim History: She is up 4 lbs since last visit.  Down 15 lbs overall TBW loss of 6.22%  Vidalia is here to discuss her progress with her obesity treatment plan. She is on the Category 2 Plan and states she is following her eating plan approximately 90 % of the time. She states she is exercising walking for 20-30 minutes 5 times per week.  Alyene Salvino is a 68 year old female who presents for follow-up of her obesity treatment plan.  She has been experiencing weight fluctuations, with a recent increase in weight despite adherence to her diet plan. She attributes this to stress and the consumption of high-sodium foods, such as microwave meals and snacks like potato chips, which may have contributed to fluid retention. She has been eating more fruits and vegetables but acknowledges some indulgence in Easter candy.  She has a history of type 2 diabetes and is currently on Mounjaro  12.5 mg weekly and Farxiga  10 mg daily. She experiences decreased bowel movements as a side effect of Mounjaro , which she manages with stool softeners and a high-fiber diet. She is also on ergocalciferol  50,000 units once weekly for vitamin D  deficiency and is awaiting a refill.  She has a history of hypertension, hyperlipidemia, and obstructive sleep apnea for which she uses CPAP. She has declined statin therapy for hyperlipidemia in the past.   Her daughter has been experiencing health issues, which has added stress to her life. Her daughter was hospitalized twice recently due to vomiting and pain, with concerns about her pancreas, prompting a recommendation for an MRI. This situation has contributed to her stress and may have impacted her weight management efforts.  OBJECTIVE: Visit Diagnoses: Problem List Items Addressed This Visit     Morbid  obesity (HCC) with starting BMI 44   Relevant Medications   tirzepatide  (MOUNJARO ) 12.5 MG/0.5ML Pen   Vitamin D  deficiency   Relevant Medications   Vitamin D , Ergocalciferol , (DRISDOL ) 1.25 MG (50000 UNIT) CAPS capsule   Type 2 diabetes mellitus with obesity (HCC) - Primary   Relevant Medications   tirzepatide  (MOUNJARO ) 12.5 MG/0.5ML Pen   BMI 40.0-44.9, adult (HCC)   Relevant Medications   tirzepatide  (MOUNJARO ) 12.5 MG/0.5ML Pen  Obesity Weight gain possibly due to stress, dietary indiscretions, and high sodium intake. Recent stress related to daughter's hospitalization. Dietary indiscretions include high-sodium microwave meals, pasta, and Easter candy. Fluid retention suspected due to high sodium intake. Encouraged adherence to dietary plan and portion control. - Encourage adherence to dietary plan with focus on low sodium and portion control. - Advise reduction of high-calorie and high-sodium foods, including pasta and processed meals. - Encourage increased intake of fresh fruits and vegetables. - Monitor weight and dietary intake closely.   Type 2 diabetes mellitus Managed with Mounjaro  12.5 mg weekly and Farxiga  10 mg daily. No side effects from Mounjaro  except constipation managed with stool softner and increasing fiber in diet.  Lab Results  Component Value Date   HGBA1C 6.2 10/17/2023   HGBA1C 6.1 04/30/2023   HGBA1C 7.5 (H) 11/28/2022   Lab Results  Component Value Date   MICROALBUR <0.7 10/17/2023   LDLCALC 120 (H) 10/17/2023   CREATININE 0.83 10/17/2023    Emphasized the importance of regular follow-up and lab monitoring due to potential risks. Discussed the benefits of Mounjaro  for diabetes management, highlighting  the necessity of medical supervision and lab checks to mitigate risks. - Continue Mounjaro  12.5 mg weekly. - Continue Farxiga  10 mg daily. - Ensure regular follow-up and lab monitoring. Meds ordered this encounter  Medications   tirzepatide   (MOUNJARO ) 12.5 MG/0.5ML Pen    Sig: Inject 12.5 mg into the skin once a week.    Dispense:  6 mL    Refill:  0   Vitamin D , Ergocalciferol , (DRISDOL ) 1.25 MG (50000 UNIT) CAPS capsule    Sig: Take 1 capsule (50,000 Units total) by mouth every 7 (seven) days.    Dispense:  4 capsule    Refill:  0     Vitamin D  deficiency Managed with ergocalciferol  50,000 units once weekly. No N/V or muscle weakness with Ergo..   Last vitamin D  Lab Results  Component Value Date   VD25OH 21.05 (L) 10/17/2023   Low vitamin D  levels can be associated with adiposity and may result in leptin resistance and weight gain. Also associated with fatigue.  Currently on vitamin D  supplementation without any adverse effects such as nausea, vomiting or muscle weakness.  - Ensure ergocalciferol  50,000 units once weekly is refilled.  Meds ordered this encounter  Medications   tirzepatide  (MOUNJARO ) 12.5 MG/0.5ML Pen    Sig: Inject 12.5 mg into the skin once a week.    Dispense:  6 mL    Refill:  0   Vitamin D , Ergocalciferol , (DRISDOL ) 1.25 MG (50000 UNIT) CAPS capsule    Sig: Take 1 capsule (50,000 Units total) by mouth every 7 (seven) days.    Dispense:  4 capsule    Refill:  0    Vitals Temp: 97.9 F (36.6 C) BP: 110/73 Pulse Rate: 87 SpO2: 96 %   Anthropometric Measurements Height: 5\' 2"  (1.575 m) Weight: 226 lb (102.5 kg) BMI (Calculated): 41.33 Weight at Last Visit: 222 lb Weight Lost Since Last Visit: 0 Weight Gained Since Last Visit: 4 lb Starting Weight: 241 lb Total Weight Loss (lbs): 15 lb (6.804 kg) Peak Weight: 250 lb   Body Composition  Body Fat %: 48.2 % Fat Mass (lbs): 109 lbs Muscle Mass (lbs): 111 lbs Total Body Water (lbs): 79 lbs Visceral Fat Rating : 17   Other Clinical Data Fasting: yes Labs: no Today's Visit #: 15 Starting Date: 11/27/22     ASSESSMENT AND PLAN:  Diet: Doni is currently in the action stage of change. As such, her goal is to continue  with weight loss efforts. She has agreed to Category 2 Plan.  Exercise: Kaileen has been instructed to work up to a goal of 150 minutes of combined cardio and strengthening exercise per week for weight loss and overall health benefits.   Behavior Modification:  We discussed the following Behavioral Modification Strategies today: increasing lean protein intake, decreasing simple carbohydrates, increasing vegetables, increase H2O intake, decreasing sodium intake, decreasing eating out, no skipping meals, meal planning and cooking strategies, avoiding temptations, and planning for success. We discussed various medication options to help Yvaine with her weight loss efforts and we both agreed to continue current treatment plan and continue to work on nutritional and behavioral strategies to promote weight loss.  .  Return in about 4 weeks (around 12/30/2023).Aaron Aas She was informed of the importance of frequent follow up visits to maximize her success with intensive lifestyle modifications for her multiple health conditions.  Attestation Statements:   Reviewed by clinician on day of visit: allergies, medications, problem list, medical history, surgical history, family history,  social history, and previous encounter notes.   Time spent on visit including pre-visit chart review and post-visit care and charting was 26 minutes.    Nadya Hopwood, PA-C

## 2023-12-04 ENCOUNTER — Telehealth (HOSPITAL_BASED_OUTPATIENT_CLINIC_OR_DEPARTMENT_OTHER): Payer: Self-pay | Admitting: Pulmonary Disease

## 2023-12-04 ENCOUNTER — Ambulatory Visit (HOSPITAL_BASED_OUTPATIENT_CLINIC_OR_DEPARTMENT_OTHER): Admitting: Cardiovascular Disease

## 2023-12-04 ENCOUNTER — Encounter (HOSPITAL_BASED_OUTPATIENT_CLINIC_OR_DEPARTMENT_OTHER): Payer: Self-pay | Admitting: Cardiovascular Disease

## 2023-12-04 VITALS — BP 109/75 | HR 88 | Ht 61.5 in | Wt 228.0 lb

## 2023-12-04 DIAGNOSIS — I700 Atherosclerosis of aorta: Secondary | ICD-10-CM

## 2023-12-04 DIAGNOSIS — Z5181 Encounter for therapeutic drug level monitoring: Secondary | ICD-10-CM | POA: Diagnosis not present

## 2023-12-04 DIAGNOSIS — G4733 Obstructive sleep apnea (adult) (pediatric): Secondary | ICD-10-CM

## 2023-12-04 DIAGNOSIS — I10 Essential (primary) hypertension: Secondary | ICD-10-CM

## 2023-12-04 HISTORY — PX: CARPAL TUNNEL RELEASE: SHX101

## 2023-12-04 MED ORDER — ATORVASTATIN CALCIUM 10 MG PO TABS: ORAL_TABLET | ORAL | 3 refills | Status: AC

## 2023-12-04 NOTE — Progress Notes (Signed)
 [facility] Follow up:    Date:  12/04/2023   ID:  Leah Lawson, DOB 1956-07-14, MRN [id number]  PCP:  Abram Abraham, NP-C  Cardiologist:  None   Referring MD: Abram Abraham, NP-C   CC: Hypertension  History of Present Illness:    Leah Lawson is a 68 y.o. female with a hx of hypertension, hyperlipidemia, diabetes, COPD, asthma, and hypertensive retinopathy, her for follow up . She was first seen in [facility] 02/2022. She saw her PCP on 10/18/21 and her blood pressure was 144/92. Her blood pressure had been uncontrolled for quite some time. She has tried several antihypertensives but did not tolerate most of them. She was kept on losartan /HCTZ and referred to the [facility]. She followed up with her PCP on 01/31/2022 and her blood pressure was 130/90. It was noted that she did not wish to start any statin medications.  She had gained 40 lb and was not very active so she was referred for PREP. We recommend limiting her sodium intake and started spirolactone. She was referred to a diabetes nutritionist. She was hesitant to take statin and this was deferred at the time. She has been seen by [facility]. Recent blood pressures have been well controlled.   Today, she has been monitoring her blood pressure at home and brought her log with her today. She has systolic readings from 110-120s and occasionally in the 90s. She reports episodes of skipped heart beats. She notices it most when she is sitting down. These episodes have been occurring since she had Covid for a 2nd time. She is compliant with her CPAP machine. She tends to still feel tired in the mornings. She reports a recent fall that resulted in a wrist fracture. She recently had a cast put on it and has to wear it for 2 weeks. She will be following up with PT soon. She continues to be seen by [facility]. She was encouraged to eat more cheese, eggs, and  other foods high in protein. She denies any palpitations, chest pain, shortness of breath, or peripheral edema. No lightheadedness, headaches, syncope, orthopnea, or PND.   Discussed the use of AI scribe software for clinical note transcription with the patient, who gave verbal consent to proceed.  History of Present Illness Leah Lawson has been experiencing difficulty managing her weight, with fluctuations despite efforts to lose weight. She walks daily except for Thursdays and Fridays, averaging about 1,500 steps a day, although this may be underreported due to not always carrying her phone. She experiences limitations in walking due to time constraints and breathing difficulties, which she attributes to her weight.  She has a history of hyperlipidemia and has experienced muscle aches with previous cholesterol medications, specifically rosuvastatin , which she had to discontinue. She is concerned about her cholesterol levels due to a family history of vascular dementia in her brother, who is currently in memory care. She is cautious about her diet, avoiding fried foods and limiting beef intake, primarily consuming chicken and fish.  Her hypertension is currently well-controlled. She monitors her blood pressure regularly and reports no recent episodes of lightheadedness or dizziness. She is on multiple medications, including losartan  and spironolactone .  She reports a history of cataract surgery and experiences eye pain when looking down, which she associates with the surgery. She also mentions experiencing muscle cramps and hand numbness upon waking, despite adequate water intake.  She has a family history of  heart problems, with her mother having had heart issues and her father having high blood pressure. She is concerned about her own risk of heart failure and is actively managing her health to prevent it.  Previous antihypertensives: Lisinopril Hydralazine  Amlodipine The patient's  Past  Medical History:  Diagnosis Date   Arthritis    Asthma    Back pain    Cataract    Chronic kidney disease    Closed fracture of distal end of left radius 11/15/2022   Constipation    COPD (chronic obstructive pulmonary disease) (HCC)    COPD (chronic obstructive pulmonary disease) (HCC)    Diabetes (HCC)    Diabetes mellitus without complication (HCC)    "pre-diabetic"   Emphysema of lung (HCC)    Glaucoma    Heartburn    High cholesterol    Hypertension    Hypertensive retinopathy 10/26/2021   Lactose intolerance    Open-angle glaucoma 10/26/2021   Other specified disorders of thyroid     Sleep apnea    SOB (shortness of breath)    Vitamin D  deficiency     Past Surgical History:  Procedure Laterality Date   BREAST SURGERY     WRIST SURGERY Left 11/18/2022    Current Medications: Current Meds  Medication Sig   albuterol  (VENTOLIN  HFA) 108 (90 Base) MCG/ACT inhaler INHALE 2 PUFFS BY MOUTH EVERY 6 HOURS AS NEEDED FOR WHEEZING AND FOR SHORTNESS OF BREATH   atorvastatin  (LIPITOR) 10 MG tablet 1 TABLET MONDAY, WEDNESDAY, AND FRIDAY ONLY   clotrimazole -betamethasone  (LOTRISONE ) cream Apply 1 Application topically daily.   COD LIVER OIL PO Take by mouth.   dapagliflozin  propanediol (FARXIGA ) 10 MG TABS tablet Take 1 tablet (10 mg total) by mouth daily.   losartan -hydrochlorothiazide  (HYZAAR) 100-25 MG tablet Take 1 tablet by mouth once daily   meloxicam  (MOBIC ) 15 MG tablet Take 1 tablet by mouth once daily   omeprazole  (PRILOSEC) 20 MG capsule Take 1 capsule by mouth once daily   oxyCODONE-acetaminophen  (PERCOCET) 10-325 MG tablet Take 1 tablet by mouth as needed for pain.   spironolactone  (ALDACTONE ) 25 MG tablet Take 1 tablet by mouth once daily   tirzepatide  (MOUNJARO ) 12.5 MG/0.5ML Pen Inject 12.5 mg into the skin once a week.   umeclidinium-vilanterol (ANORO ELLIPTA ) 62.5-25 MCG/ACT AEPB Inhale 1 puff by mouth once daily   Vitamin D , Ergocalciferol , (DRISDOL ) 1.25 MG  (50000 UNIT) CAPS capsule Take 1 capsule (50,000 Units total) by mouth every 7 (seven) days.   [DISCONTINUED] rosuvastatin  (CRESTOR ) 10 MG tablet TAKE 1 TABLET MONDAY, WEDNESDAY, AND FRIDAY ONLY     Allergies:   Hydralazine , Lisinopril, Rosuvastatin , Singulair [montelukast sodium], and Mucinex [guaifenesin er]   Social History   Socioeconomic History   Marital status: Single    Spouse name: Not on file   Number of children: 4   Years of education: Not on file   Highest education level: Master's degree (e.g., MA, MS, MEng, MEd, MSW, MBA)  Occupational History   Occupation: retired  Tobacco Use   Smoking status: Former    Current packs/day: 0.00    Average packs/day: 0.8 packs/day for 46.0 years (34.5 ttl pk-yrs)    Types: Cigarettes    Start date: 05/05/1972    Quit date: 05/05/2018    Years since quitting: 5.5    Passive exposure: Past   Smokeless tobacco: Never  Vaping Use   Vaping status: Never Used  Substance and Sexual Activity   Alcohol use: Not Currently   Drug  use: Never   Sexual activity: Not on file  Other Topics Concern   Not on file  Social History Narrative   Not on file   Social Drivers of Health   Financial Resource Strain: Low Risk  (10/10/2023)   Overall Financial Resource Strain (CARDIA)    Difficulty of Paying Living Expenses: Not very hard  Food Insecurity: No Food Insecurity (10/10/2023)   Hunger Vital Sign    Worried About Running Out of Food in the Last Year: Never true    Ran Out of Food in the Last Year: Never true  Transportation Needs: No Transportation Needs (10/10/2023)   PRAPARE - Administrator, Civil Service (Medical): No    Lack of Transportation (Non-Medical): No  Physical Activity: Insufficiently Active (10/10/2023)   Exercise Vital Sign    Days of Exercise per Week: 3 days    Minutes of Exercise per Session: 20 min  Stress: No Stress Concern Present (10/10/2023)   [employer] of Occupational Health - Occupational Stress  Questionnaire    Feeling of Stress : Not at all  Social Connections: Moderately Integrated (10/10/2023)   Social Connection and Isolation Panel [NHANES]    Frequency of Communication with Friends and Family: Three times a week    Frequency of Social Gatherings with Friends and Family: Once a week    Attends Religious Services: More than 4 times per year    Active Member of [employer] or Organizations: Yes    Attends Engineer, structural: More than 4 times per year    Marital Status: Never married     Family History: The patient's family history includes Breast cancer in her cousin; COPD in her father; Cancer in her father and maternal grandfather; Colon cancer in her father; Colon polyps in her father; Dementia in her father; Diabetes in her brother and mother; Heart failure in her mother; High Cholesterol in her father; High blood pressure in her brother, father, and mother; Hypertension in her father and mother; Miscarriages / [place] in her daughter; Stroke in her brother. There is no history of Esophageal cancer, Liver disease, Rectal cancer, or Stomach cancer.  ROS:   Please see the history of present illness.    (+) skipped beats (PVCs) All other systems reviewed and are negative.  EKGs/Labs/Other Studies Reviewed:    CT Chest  06/05/2020: FINDINGS: Cardiovascular: No significant coronary artery calcification. Global cardiac size within normal limits. No pericardial effusion. Central pulmonary arteries are enlarged in keeping with changes of pulmonary arterial hypertension. This has progressed slightly since prior examination. Mild atherosclerotic calcification is seen within the thoracic aorta. No aortic aneurysm.   Mediastinum/Nodes: 21 mm nodule is seen within the left thyroid  lobe, enlarged since prior examination. No pathologic thoracic adenopathy. The esophagus is unremarkable.   Lungs/Pleura: Moderate emphysematous changes are again identified within the lungs.  Superimposed scattered areas of parenchymal scarring are stable since prior examination. No focal pulmonary nodules or confluent pulmonary infiltrates are identified. No pneumothorax or pleural effusion. The central airways are widely patent.   Upper Abdomen: 9 mm nonobstructing calculus is seen within the visualized upper pole of the right kidney. Small hiatal hernia noted.   Musculoskeletal: No lytic or blastic bone lesion identified. Osseous structures are age-appropriate.   IMPRESSION: Stable changes of emphysema with superimposed scattered areas of parenchymal scarring. No focal pulmonary nodule identified.   Morphologic changes of pulmonary arterial hypertension, slightly progressed since prior examination.   21 mm left thyroid  nodule. A  underlying benign etiology is suspected given its relatively slow interval growth since remote prior examination. This is, however, not definitively characterized on this examination and would meet criteria for dedicated thyroid  sonography if clinically indicated. (ref: J Am Coll Radiol. 2015 Feb;12(2): 143-50).   Nonobstructing right nephrolithiasis, incompletely evaluated.   Aortic Atherosclerosis (ICD10-I70.0) and Emphysema (ICD10-J43.9).   EKG:  EKG is personally reviewed. 12/04/2022: EKG was not ordered. 02/11/2022: Sinus rhythm. Rate 86 bpm.  Recent Labs: 10/17/2023: ALT 17; BUN 25; Creatinine, Ser 0.83; Hemoglobin 16.2; Platelets 256.0; Potassium 4.2; Sodium 140   Recent Lipid Panel    Component Value Date/Time   CHOL 181 10/17/2023 1204   CHOL 179 11/28/2022 1507   TRIG 88.0 10/17/2023 1204   HDL 43.10 10/17/2023 1204   HDL 39 (L) 11/28/2022 1507   CHOLHDL 4 10/17/2023 1204   VLDL 17.6 10/17/2023 1204   LDLCALC 120 (H) 10/17/2023 1204   LDLCALC 108 (H) 11/28/2022 1507    Physical Exam:    VS:  BP 109/75   Pulse 88   Ht 5' 1.5" (1.562 m)   Wt 228 lb (103.4 kg)   SpO2 94%   BMI 42.38 kg/m  , BMI Body mass index is  42.38 kg/m. GENERAL:  Well appearing HEENT: Pupils equal round and reactive, fundi not visualized, oral mucosa unremarkable NECK:  No jugular venous distention, waveform within normal limits, carotid upstroke brisk and symmetric, no  LUNGS:  Clear to auscultation bilaterally HEART:  RRR.  PMI not displaced or sustained,S1 and S2 within normal limits, no S3, no S4, no clicks, no rubs, no murmurs ABD:  Flat, positive bowel sounds normal in frequency in pitch, no bruits, no rebound, no guarding, no midline pulsatile mass, no hepatomegaly, no splenomegaly EXT:  2 plus pulses throughout, no edema, no cyanosis no clubbing SKIN:  No rashes no nodules, Ecchymosis of left shin NEURO:  Cranial nerves II through XII grossly intact, motor grossly intact throughout PSYCH:  Cognitively intact, oriented to person place and time   ASSESSMENT/PLAN:    Assessment and Plan Assessment & Plan # Hypertension: Well-controlled with losartan /hydrochlorothiazide  and spironolactone .  # Hyperlipidemia LDL cholesterol at 120 mg/dL, above target. Previous rosuvastatin  intolerance due to myalgia. Discussed importance of lowering cholesterol to prevent vascular dementia. Cholesterol levels suggest typical hyperlipidemia. - Prescribe atorvastatin  10 mg on Monday, Wednesday, and Friday. - Discontinue atorvastatin  and notify provider if myalgia occurs. - Consider non-statin options like Nexlizet or biannual injections if atorvastatin  is not tolerated. - Encourage dietary modifications, including reducing animal products and fried foods. - Order lipid panel and comprehensive metabolic panel around July 4th to reassess cholesterol and check renal, hepatic function, and electrolytes.  # Obesity Obesity managed with Mounjaro , resulting in 203-pound weight loss over two years. Emphasized exercise and dietary modifications. - Continue Mounjaro  for weight management. - Encourage daily exercise, aiming for more than 5,000  steps per day. - Advise on dietary modifications, including reducing fried foods and animal products.    Screening for Secondary Hypertension:     02/11/2022    3:56 PM  Causes  Drugs/Herbals Screened     - Comments 1-2 caffeinated drink daily.  Limits salt.  Rare Aleve.  Renovascular HTN N/A  Sleep Apnea Screened     - Comments Uses CPAP  Thyroid  Disease Screened  Hyperaldosteronism N/A  Pheochromocytoma N/A    Relevant Labs/Studies:    Latest Ref Rng & Units 10/17/2023   12:04 PM 04/30/2023    2:23 PM 03/04/2023  10:14 AM  Basic Labs  Sodium 135 - 145 mEq/L 140  136  141   Potassium 3.5 - 5.1 mEq/L 4.2  4.0  4.2   Creatinine 0.40 - 1.20 mg/dL 9.60  4.54  0.98        Latest Ref Rng & Units 11/28/2022    3:07 PM 08/14/2022   10:51 AM  Thyroid    TSH 0.450 - 4.500 uIU/mL 0.454  1.07                  Disposition:    FU with Jehan Ranganathan C. Theodis Fiscal, MD, [facility] in 6 months.  If stable she can graduate from [facility]   Medication Adjustments/Labs and Tests Ordered: Current medicines are reviewed at length with the patient today.  Concerns regarding medicines are outlined above.   Orders Placed This Encounter  Procedures   Lipid panel   Comprehensive metabolic panel with GFR   EKG [id number]   Meds ordered this encounter  Medications   atorvastatin  (LIPITOR) 10 MG tablet    Sig: 1 TABLET MONDAY, WEDNESDAY, AND FRIDAY ONLY    Dispense:  90 tablet    Refill:  3    D/C CRESTOR      Signed, Maudine Sos, MD  12/04/2023 5:31 PM    [facility]

## 2023-12-04 NOTE — Telephone Encounter (Signed)
 Height changed in chart

## 2023-12-04 NOTE — Patient Instructions (Addendum)
 Medication Instructions:  STOP ROSUVASTATIN    START ATORVASTATIN  10 MG MONDAY, WEDNESDAY, AND FRIDAY ONLY   Labwork: FASTING LP/CMET AROUND JULY 4TH   Testing/Procedures: NONE  Follow-Up: 6 MONTHS   If you need a refill on your cardiac medications before your next appointment, please call your pharmacy.

## 2023-12-04 NOTE — Telephone Encounter (Signed)
 Patient presents in clinc. She states she has not received HST results. She also is wanting to note that she is 5'1 and not 5'6. She is also upset that she is being billed for the equipment she's using and the study. She is very frustrated and has a lot of questions. Please contact patient to address everything.

## 2023-12-05 NOTE — Telephone Encounter (Signed)
Pt notified of results on VM

## 2023-12-05 NOTE — Telephone Encounter (Signed)
 Pt had ONO done on 10/30/2023 can you please review? It has been scanned into chart

## 2023-12-10 ENCOUNTER — Other Ambulatory Visit: Payer: Self-pay | Admitting: Family Medicine

## 2023-12-14 ENCOUNTER — Other Ambulatory Visit: Payer: Self-pay | Admitting: Pulmonary Disease

## 2023-12-23 ENCOUNTER — Encounter: Payer: Self-pay | Admitting: Gastroenterology

## 2023-12-26 ENCOUNTER — Other Ambulatory Visit (INDEPENDENT_AMBULATORY_CARE_PROVIDER_SITE_OTHER): Payer: Self-pay | Admitting: Physician Assistant

## 2023-12-26 DIAGNOSIS — E559 Vitamin D deficiency, unspecified: Secondary | ICD-10-CM

## 2023-12-27 ENCOUNTER — Other Ambulatory Visit (HOSPITAL_BASED_OUTPATIENT_CLINIC_OR_DEPARTMENT_OTHER): Payer: Self-pay | Admitting: Family

## 2023-12-29 ENCOUNTER — Other Ambulatory Visit: Payer: Self-pay | Admitting: Family Medicine

## 2023-12-29 DIAGNOSIS — M5136 Other intervertebral disc degeneration, lumbar region with discogenic back pain only: Secondary | ICD-10-CM

## 2023-12-29 DIAGNOSIS — M545 Low back pain, unspecified: Secondary | ICD-10-CM

## 2023-12-30 ENCOUNTER — Encounter (HOSPITAL_BASED_OUTPATIENT_CLINIC_OR_DEPARTMENT_OTHER): Payer: Self-pay | Admitting: Cardiovascular Disease

## 2023-12-30 NOTE — Progress Notes (Unsigned)
 SUBJECTIVE: Discussed the use of AI scribe software for clinical note transcription with the patient, who gave verbal consent to proceed.  Chief Complaint: Obesity  Interim History: She is down 5 lbs since last visit.  Down 20 lbs overall TBW loss of 8.3%  Delisia is here to discuss her progress with her obesity treatment plan. She is on the Category 2 Plan and states she is following her eating plan approximately 80 % of the time. She states she is exercising walking/[employer] right start program 30 minutes 5 times per week.  Sharlotte Poythress is a 68 year old female with obesity who presents for follow-up of her obesity treatment plan.  She has been on Mounjaro  12.5 mg weekly and adheres to a category two plan approximately 80% of the time, resulting in a total weight loss of 20 pounds. She engages in physical activity, walking or exercising for 30 minutes five times per week. She experiences cravings, particularly in the evening, and manages them by avoiding sweet and salty snacks.  Her medical history includes type two diabetes, hypertension, hyperlipidemia, and vitamin D  deficiency. Her diabetes management includes Mounjaro , with an improvement in A1c from 7.7% last year to 6.2% in March. She is on a statin for hyperlipidemia and takes vitamin D  supplements. She uses Colace to manage bowel regularity, especially after her weekly Mounjaro  dose.  She uses oxygen at night and a CPAP machine for sleep apnea, experiencing soreness on her face from the CPAP mask. She has dry skin on her back, which she manages with lotion. She is concerned about potential weight regain after stopping Mounjaro , as her daughter-in-law experienced weight gain after stopping Wegovy.  Her family history includes her mother who had type two diabetes and required dialysis.  OBJECTIVE: Visit Diagnoses: Problem List Items Addressed This Visit     Morbid obesity (HCC) with starting BMI 44   Relevant Medications    tirzepatide  (MOUNJARO ) 12.5 MG/0.5ML Pen   Vitamin D  deficiency   Relevant Medications   Vitamin D , Ergocalciferol , (DRISDOL ) 1.25 MG (50000 UNIT) CAPS capsule   Hyperlipidemia associated with type 2 diabetes mellitus (HCC)   Relevant Medications   tirzepatide  (MOUNJARO ) 12.5 MG/0.5ML Pen   Type 2 diabetes mellitus with obesity (HCC) - Primary   Relevant Medications   tirzepatide  (MOUNJARO ) 12.5 MG/0.5ML Pen   OSA on CPAP   BMI 40.0-44.9, adult (HCC)   Relevant Medications   tirzepatide  (MOUNJARO ) 12.5 MG/0.5ML Pen   Drug-induced constipation  Obesity Soniya is on Mounjaro  12.5 mg weekly, resulting in a 20-pound weight loss. She exercises regularly, walking 30 minutes five times per week, and adheres to her dietary plan 80% of the time. She manages evening cravings with non-sweet, protein-based snacks. Discussed potential weight regain after discontinuing Mounjaro  and the option of continuing at a lower dose or frequency to maintain weight loss and manage diabetes. - Continue Mounjaro  12.5 mg weekly - Encourage adherence to dietary plan - Encourage regular exercise, 30 minutes five times per week - Refill Mounjaro  prescription  Type 2 diabetes mellitus Her diabetes is well-managed with an A1c of 6.2, improved from 7.7 last year, aided by Mounjaro . Emphasized the importance of continued medication to maintain control and prevent complications. Discussed potential remission but advised against documenting to avoid insurance issues. - Continue Mounjaro  for diabetes management - Monitor A1c levels, recheck in July or August  Obstructive sleep apnea She uses CPAP and oxygen at night. Discussed potential improvement with weight loss and the possibility of re-evaluating  CPAP need with a future sleep study. - Continue CPAP and oxygen at night - Consider future sleep study to reassess need for CPAP after further weight loss  Hyperlipidemia She is on a statin for hyperlipidemia, anticipating  improved lipid levels. - Continue statin therapy - Monitor lipid levels  Vitamin D  deficiency She requires ongoing vitamin D  supplementation and needs a prescription refill. - Refill vitamin D  prescription  Vitals Temp: 98.1 F (36.7 C) BP: 107/73 Pulse Rate: 99 SpO2: 92 %   Anthropometric Measurements Height: 5\' 2"  (1.575 m) Weight: 221 lb (100.2 kg) BMI (Calculated): 40.41 Weight at Last Visit: 226 lb Weight Lost Since Last Visit: 5 lb Weight Gained Since Last Visit: 0 Starting Weight: 241 lb Total Weight Loss (lbs): 20 lb (9.072 kg) Peak Weight: 250 lb   Body Composition  Body Fat %: 47.1 % Fat Mass (lbs): 104.2 lbs Muscle Mass (lbs): 111.2 lbs Total Body Water (lbs): 77.8 lbs Visceral Fat Rating : 16   Other Clinical Data Fasting: No Labs: No Today's Visit #: 16 Starting Date: 11/27/22     ASSESSMENT AND PLAN:  Diet: Kairi is currently in the action stage of change. As such, her goal is to continue with weight loss efforts. She has agreed to Category 2 Plan.  Exercise: Regis has been instructed to work up to a goal of 150 minutes of combined cardio and strengthening exercise per week for weight loss and overall health benefits.   Behavior Modification:  We discussed the following Behavioral Modification Strategies today: increasing lean protein intake, decreasing simple carbohydrates, increasing vegetables, increase H2O intake, decreasing sodium intake, increase high fiber foods, no skipping meals, meal planning and cooking strategies, avoiding temptations, and planning for success. We discussed various medication options to help Sonji with her weight loss efforts and we both agreed to continue current treatment plan, continue to work on nutritional and behavioral strategies to promote weight loss.  .  Return in about 4 weeks (around 01/28/2024).Aaron Aas She was informed of the importance of frequent follow up visits to maximize her success with intensive  lifestyle modifications for her multiple health conditions.  Attestation Statements:   Reviewed by clinician on day of visit: allergies, medications, problem list, medical history, surgical history, family history, social history, and previous encounter notes.   Time spent on visit including pre-visit chart review and post-visit care and charting was *** minutes.    Zyla Dascenzo, PA-C

## 2023-12-31 ENCOUNTER — Encounter (INDEPENDENT_AMBULATORY_CARE_PROVIDER_SITE_OTHER): Payer: Self-pay | Admitting: Physician Assistant

## 2023-12-31 ENCOUNTER — Ambulatory Visit (INDEPENDENT_AMBULATORY_CARE_PROVIDER_SITE_OTHER): Admitting: Physician Assistant

## 2023-12-31 VITALS — BP 107/73 | HR 99 | Temp 98.1°F | Ht 62.0 in | Wt 221.0 lb

## 2023-12-31 DIAGNOSIS — E1169 Type 2 diabetes mellitus with other specified complication: Secondary | ICD-10-CM

## 2023-12-31 DIAGNOSIS — E559 Vitamin D deficiency, unspecified: Secondary | ICD-10-CM

## 2023-12-31 DIAGNOSIS — E785 Hyperlipidemia, unspecified: Secondary | ICD-10-CM | POA: Diagnosis not present

## 2023-12-31 DIAGNOSIS — Z6841 Body Mass Index (BMI) 40.0 and over, adult: Secondary | ICD-10-CM

## 2023-12-31 DIAGNOSIS — Z7985 Long-term (current) use of injectable non-insulin antidiabetic drugs: Secondary | ICD-10-CM

## 2023-12-31 DIAGNOSIS — G8929 Other chronic pain: Secondary | ICD-10-CM

## 2023-12-31 DIAGNOSIS — K5903 Drug induced constipation: Secondary | ICD-10-CM

## 2023-12-31 DIAGNOSIS — Z5320 Procedure and treatment not carried out because of patient's decision for unspecified reasons: Secondary | ICD-10-CM

## 2023-12-31 DIAGNOSIS — G4733 Obstructive sleep apnea (adult) (pediatric): Secondary | ICD-10-CM

## 2023-12-31 MED ORDER — VITAMIN D (ERGOCALCIFEROL) 1.25 MG (50000 UNIT) PO CAPS: 50000.0000 [IU] | ORAL_CAPSULE | ORAL | 0 refills | Status: DC

## 2023-12-31 MED ORDER — TIRZEPATIDE 12.5 MG/0.5ML ~~LOC~~ SOAJ: 12.5000 mg | SUBCUTANEOUS | 0 refills | Status: DC

## 2024-01-06 ENCOUNTER — Other Ambulatory Visit: Payer: Self-pay | Admitting: Pulmonary Disease

## 2024-01-10 ENCOUNTER — Encounter (HOSPITAL_COMMUNITY): Payer: Self-pay

## 2024-01-10 ENCOUNTER — Ambulatory Visit (INDEPENDENT_AMBULATORY_CARE_PROVIDER_SITE_OTHER)

## 2024-01-10 ENCOUNTER — Ambulatory Visit (HOSPITAL_COMMUNITY)
Admission: EM | Admit: 2024-01-10 | Discharge: 2024-01-10 | Disposition: A | Discharge status: Home / Self Care | Attending: Family Medicine | Admitting: Family Medicine

## 2024-01-10 DIAGNOSIS — M25561 Pain in right knee: Secondary | ICD-10-CM | POA: Diagnosis not present

## 2024-01-10 MED ORDER — PREDNISONE 20 MG PO TABS
40.0000 mg | ORAL_TABLET | Freq: Every day | ORAL | 0 refills | Status: DC
Start: 2024-01-10 — End: 2024-01-15

## 2024-01-10 NOTE — ED Triage Notes (Signed)
 Patient presenting with right knee pain and swelling onset 2 weeks ago. States the pain has gotten worse since yesterday. States she was on a cholesterol medication and it makes her ache, started taking it at night. Has been up and down on a step stool as well.   Prescriptions or OTC medications tried: Yes- oxycodone, Aleve    with little relief

## 2024-01-10 NOTE — Discharge Instructions (Signed)
 The x-ray has been read and shows some arthritis changes, but no broken bones.  Take prednisone  20 mg--2 daily for 5 days  Do not take your meloxicam  or Aleve while you are taking the prednisone .  Please follow-up with your primary care physician.

## 2024-01-10 NOTE — ED Provider Notes (Signed)
 [facility]    CSN: [id number] Arrival date & time: 01/10/24  1519      History   Chief Complaint Chief Complaint  Patient presents with   Knee Pain    HPI Leah Lawson is a 68 y.o. female.    Knee Pain Here for right knee pain.  It has been bothering her for about 2 weeks.  It is mostly on the lateral side down into the proximal part of her lower leg.  It can radiate down into her right ankle.  No trauma or fall, but about 2 weeks ago she did do some work in her house using a footstool and was leaning over to the left and right and wonders if she strained some muscles.  Today she is taken off oxycodone/APAP and it is starting to help.  She has been taking Aleve and has not been helping much  Turns out she is also taking meloxicam  daily for her back.    She had the Percocet for the wrist pain and injury.  She is allergic to hydralazine , lisinopril, Crestor , Singulair, and Mucinex.  Past medical history significant for diabetes, and her sugars have been doing pretty well lately.  Past Medical History:  Diagnosis Date   Arthritis    Asthma    Back pain    Cataract    Chronic kidney disease    Closed fracture of distal end of left radius 11/15/2022   Constipation    COPD (chronic obstructive pulmonary disease) (HCC)    COPD (chronic obstructive pulmonary disease) (HCC)    Diabetes (HCC)    Diabetes mellitus without complication (HCC)    "pre-diabetic"   Emphysema of lung (HCC)    Glaucoma    Heartburn    High cholesterol    Hypertension    Hypertensive retinopathy 10/26/2021   Lactose intolerance    Open-angle glaucoma 10/26/2021   Other specified disorders of thyroid     Sleep apnea    SOB (shortness of breath)    Vitamin D  deficiency     Patient Active Problem List   Diagnosis Date Noted   Bilateral low back pain without sciatica 06/22/2023   Calculus of gallbladder without cholecystitis without obstruction 06/22/2023   Right renal stone  06/22/2023   Drug-induced constipation 06/04/2023   Inflammatory pain 05/09/2023   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   Left wrist pain 03/16/2023   Intertrigo 12/17/2022   Insulin  resistance 12/11/2022   Stage 3a chronic kidney disease (HCC) 12/11/2022   Other fatigue 11/27/2022   Essential hypertension 11/27/2022   SOBOE (shortness of breath on exertion) 11/27/2022   Type 2 diabetes mellitus with other specified complication (HCC) 11/27/2022   BMI 40.0-44.9, adult (HCC) 11/27/2022   Depression screen 11/27/2022   Depression 11/27/2022   Lumbar disc disease 10/10/2022   Nocturnal hypoxia 07/12/2022   OSA on CPAP 02/28/2022   Contact dermatitis 02/01/2022   Polycythemia 01/31/2022   Statin declined 01/31/2022   Type 2 diabetes mellitus with obesity (HCC) 01/31/2022   Hypertensive retinopathy 10/26/2021   Open-angle glaucoma 10/26/2021   Vitamin D  deficiency 10/18/2021   Pulmonary emphysema (HCC) 10/18/2021   Aortic atherosclerosis (HCC) 10/18/2021   Multiple thyroid  nodules 10/18/2021   Osteopenia 10/18/2021   Hyperlipidemia associated with type 2 diabetes mellitus (HCC) 10/18/2021   Morbid obesity (HCC) with starting BMI 44 11/25/2019   Tobacco abuse 05/08/2018   COPD exacerbation (HCC) 05/08/2018   Diabetes mellitus without complication (HCC)  Past Surgical History:  Procedure Laterality Date   BREAST SURGERY     WRIST SURGERY Left 11/18/2022    OB History     Gravida  4   Para  4   Term      Preterm      AB      Living         SAB      IAB      Ectopic      Multiple      Live Births           Obstetric Comments  4 natural births           Home Medications    Prior to Admission medications   Medication Sig Start Date End Date Taking? Authorizing Provider  albuterol  (VENTOLIN  HFA) 108 (90 Base) MCG/ACT inhaler INHALE 2 PUFFS BY MOUTH EVERY 6 HOURS AS NEEDED FOR WHEEZING AND FOR SHORTNESS OF BREATH 01/06/24  Yes Lind Repine, MD  atorvastatin  (LIPITOR) 10 MG tablet 1 TABLET MONDAY, WEDNESDAY, AND FRIDAY ONLY 12/04/23  Yes Maudine Sos, MD  clotrimazole -betamethasone  (LOTRISONE ) cream Apply 1 Application topically daily. 12/17/22  Yes Henson, Vickie L, NP-C  COD LIVER OIL PO Take by mouth.   Yes [provider]  dapagliflozin  propanediol (FARXIGA ) 10 MG TABS tablet Take 1 tablet by mouth once daily 12/10/23  Yes Henson, Vickie L, NP-C  losartan -hydrochlorothiazide  (HYZAAR) 100-25 MG tablet Take 1 tablet by mouth once daily 06/17/23  Yes Maudine Sos, MD  meloxicam  (MOBIC ) 15 MG tablet Take 1 tablet by mouth once daily 12/30/23  Yes Henson, Vickie L, NP-C  omeprazole  (PRILOSEC) 20 MG capsule Take 1 capsule by mouth once daily 10/07/23  Yes Cunningham, Scott E, MD  oxyCODONE-acetaminophen  (PERCOCET) 10-325 MG tablet Take 1 tablet by mouth as needed for pain. 11/28/22  Yes [provider]  predniSONE  (DELTASONE ) 20 MG tablet Take 2 tablets (40 mg total) by mouth daily with breakfast for 5 days. 01/10/24 01/15/24 Yes Ann Keto, MD  spironolactone  (ALDACTONE ) 25 MG tablet TAKE 1 TABLET BY MOUTH ONCE DAILY . APPOINTMENT REQUIRED FOR FUTURE REFILLS 12/30/23  Yes Maudine Sos, MD  tirzepatide  (MOUNJARO ) 12.5 MG/0.5ML Pen Inject 12.5 mg into the skin once a week. 12/31/23  Yes Rayburn, Evangelina Hilt, PA-C  umeclidinium-vilanterol (ANORO ELLIPTA ) 62.5-25 MCG/ACT AEPB Inhale 1 puff by mouth once daily 10/23/23  Yes Alva, Rakesh V, MD  Vitamin D , Ergocalciferol , (DRISDOL ) 1.25 MG (50000 UNIT) CAPS capsule Take 1 capsule (50,000 Units total) by mouth every 7 (seven) days. 12/31/23  Yes Rayburn, Evangelina Hilt, PA-C    Family History Family History  Problem Relation Age of Onset   Hypertension Mother    Heart failure Mother    Diabetes Mother    High blood pressure Mother    Hypertension Father    Colon polyps Father    Colon cancer Father    Cancer Father        prostate, lung   COPD  Father    High Cholesterol Father    High blood pressure Father    Dementia Father    Diabetes Brother    High blood pressure Brother    Stroke Brother    Cancer Maternal Grandfather    Miscarriages / Stillbirths Daughter    Breast cancer Cousin    Esophageal cancer Neg Hx    Liver disease Neg Hx    Rectal cancer Neg Hx    Stomach cancer Neg Hx  Social History Social History   Tobacco Use   Smoking status: Former    Current packs/day: 0.00    Average packs/day: 0.8 packs/day for 46.0 years (34.5 ttl pk-yrs)    Types: Cigarettes    Start date: 05/05/1972    Quit date: 05/05/2018    Years since quitting: 5.6    Passive exposure: Past   Smokeless tobacco: Never  Vaping Use   Vaping status: Never Used  Substance Use Topics   Alcohol use: Not Currently   Drug use: Never     Allergies   Hydralazine , Lisinopril, Rosuvastatin , Singulair [montelukast sodium], and Mucinex [guaifenesin er]   Review of Systems Review of Systems   Physical Exam Triage Vital Signs ED Triage Vitals  Encounter Vitals Group     BP 01/10/24 1622 118/79     Systolic BP Percentile --      Diastolic BP Percentile --      Pulse Rate 01/10/24 1622 93     Resp 01/10/24 1622 18     Temp 01/10/24 1622 98.1 F (36.7 C)     Temp Source 01/10/24 1622 Oral     SpO2 01/10/24 1622 90 %     Weight 01/10/24 1622 220 lb 14.4 oz (100.2 kg)     Height 01/10/24 1622 5\' 2"  (1.575 m)     Head Circumference --      Peak Flow --      Pain Score 01/10/24 1620 10     Pain Loc --      Pain Education --      Exclude from Growth Chart --    No data found.  Updated Vital Signs BP 118/79 (BP Location: Right Arm)   Pulse 93   Temp 98.1 F (36.7 C) (Oral)   Resp 18   Ht 5\' 2"  (1.575 m)   Wt 100.2 kg   SpO2 90%   BMI 40.40 kg/m   Visual Acuity Right Eye Distance:   Left Eye Distance:   Bilateral Distance:    Right Eye Near:   Left Eye Near:    Bilateral Near:     Physical Exam Vitals reviewed.   Constitutional:      General: She is not in acute distress.    Appearance: She is not ill-appearing, toxic-appearing or diaphoretic.  Musculoskeletal:     Comments: There is some tenderness along the lateral joint line and along the lower leg distal to the right knee.  No rash or erythema or deformity.  No calf tenderness and no edema evident of the lower leg.  There is maybe some effusion of the knee.  Skin:    Coloration: Skin is not jaundiced or pale.  Neurological:     General: No focal deficit present.     Mental Status: She is alert and oriented to person, place, and time.  Psychiatric:        Behavior: Behavior normal.      UC Treatments / Results  Labs (all labs ordered are listed, but only abnormal results are displayed) Labs Reviewed - No data to display  EKG   Radiology DG Knee Complete 4 Views Right Result Date: 01/10/2024 CLINICAL DATA:  Right knee pain and swelling for the past 2 weeks. EXAM: RIGHT KNEE - COMPLETE 4+ VIEW COMPARISON:  None Available. FINDINGS: Mild tricompartmental degenerative changes with minimal progression. No fracture, dislocation or effusion seen. IMPRESSION: Mild tricompartmental degenerative changes with minimal progression. Electronically Signed   By: Lanie Pitcairn.D.  On: 01/10/2024 17:33    Procedures Procedures (including critical care time)  Medications Ordered in UC Medications - No data to display  Initial Impression / Assessment and Plan / UC Course  I have reviewed the triage vital signs and the nursing notes.  Pertinent labs & imaging results that were available during my care of the patient were reviewed by me and considered in my medical decision making (see chart for details).     X-ray is read as negative except for some arthritis changes  Prednisone  is sent in for burst and I have recommended an over-the-counter lidocaine patch.  I have asked her to follow-up with her primary care  I have also reviewed with her  that she should not take 2 NSAIDs or anti-inflammatories at the same time.  So another worse, she should not take Aleve with her meloxicam  that she takes daily.  I have warned her that she should not take the meloxicam  while she is taking the prednisone . Final Clinical Impressions(s) / UC Diagnoses   Final diagnoses:  Acute pain of right knee     Discharge Instructions      The x-ray has been read and shows some arthritis changes, but no broken bones.  Take prednisone  20 mg--2 daily for 5 days  Do not take your meloxicam  or Aleve while you are taking the prednisone .  Please follow-up with your primary care physician.   ED Prescriptions     Medication Sig Dispense Auth. Provider   predniSONE  (DELTASONE ) 20 MG tablet Take 2 tablets (40 mg total) by mouth daily with breakfast for 5 days. 10 tablet Ellsworth Haas Kavin Weckwerth K, MD      PDMP not reviewed this encounter.   Ann Keto, MD 01/10/24 [phone]

## 2024-01-13 ENCOUNTER — Ambulatory Visit: Payer: Self-pay

## 2024-01-13 NOTE — Telephone Encounter (Signed)
 FYI Only or Action Required?: FYI only for provider  Patient was last seen in primary care on 10/17/2023 by Alyson Back L, NP-C. Called Nurse Triage reporting Joint Swelling/knee and lower leg swelling. Symptoms began several weeks ago. Interventions attempted: Prescription medications: Oxycodone and Rest, hydration, or home remedies. Symptoms are: gradually worsening.  Triage Disposition: See Physician Within 24 Hours  Patient/caregiver understands and will follow disposition?: Yes      Copied from CRM [id number]. Topic: Clinical - Red Word Triage >> Jan 13, 2024  9:02 AM Leah Lawson wrote: Reason from [id number] called stating her knee is swollen and she has inflammation in it. Patient stated she went to the urgent care and they gave her prednisone  but she states the pharmacy said the medication should be working by now. The patient states she is still having pain. The urgent care did xrays Acute pain on the right knee Reason for Disposition  [1] MODERATE leg swelling (e.g., swelling extends up to knees) AND [2] new-onset or worsening  Answer Assessment - Initial Assessment Questions 1. ONSET: "When did the swelling start?" (e.g., minutes, hours, days)     2.5 weeks 2. LOCATION: "What part of the leg is swollen?"  "Are both legs swollen or just one leg?"     Knee down on right leg 3. SEVERITY: "How bad is the swelling?" (e.g., localized; mild, moderate, severe)   - Localized: Small area of swelling localized to one leg.   - MILD pedal edema: Swelling limited to foot and ankle, pitting edema < 1/4 inch (6 mm) deep, rest and elevation eliminate most or all swelling.   - MODERATE edema: Swelling of lower leg to knee, pitting edema > 1/4 inch (6 mm) deep, rest and elevation only partially reduce swelling.   - SEVERE edema: Swelling extends above knee, facial or hand swelling present.      Noticeably more swollen 4. REDNESS: "Does the swelling look red or infected?"     Swelling  5. PAIN:  "Is the swelling painful to touch?" If Yes, ask: "How painful is it?"   (Scale 1-10; mild, moderate or severe)     10 6. FEVER: "Do you have a fever?" If Yes, ask: "What is it, how was it measured, and when did it start?"      No 7. CAUSE: "What do you think is causing the leg swelling?"     X Ray shows arthritic changes but no broken bones 8. MEDICAL HISTORY: "Do you have a history of blood clots (e.g., DVT), cancer, heart failure, kidney disease, or liver failure?"     No hx of blood clots, no hx of heart failure or kidney disease  10. OTHER SYMPTOMS: "Do you have any other symptoms?" (e.g., chest pain, difficulty breathing)       No  Protocols used: Leg Swelling and Edema-A-AH

## 2024-01-14 ENCOUNTER — Encounter: Payer: Self-pay | Admitting: Internal Medicine

## 2024-01-14 ENCOUNTER — Ambulatory Visit: Admitting: Internal Medicine

## 2024-01-14 VITALS — BP 136/74 | HR 82 | Temp 98.5°F | Ht 62.0 in | Wt 229.0 lb

## 2024-01-14 DIAGNOSIS — M25561 Pain in right knee: Secondary | ICD-10-CM

## 2024-01-14 DIAGNOSIS — I10 Essential (primary) hypertension: Secondary | ICD-10-CM | POA: Diagnosis not present

## 2024-01-14 NOTE — Patient Instructions (Addendum)
    Make an appointment with sports medicine downstairs for your acute right knee pain.      Medications changes include :   None    A referral was ordered sports medicine and someone will call you to schedule an appointment.

## 2024-01-14 NOTE — Progress Notes (Signed)
 Subjective:    Patient ID: Leah Lawson, female    DOB: 1955-12-03, 68 y.o.   MRN: [id number]      HPI Leah Lawson is here for  Chief Complaint  Patient presents with   Knee Pain    Right knee pain x 2 weeks     Right knee pain - it started two weeks ago.  There was no obvious injury.  The knee is swollen.  The pain started on the lateral aspect of her knee and now i she has pain throughout the knee and in the proximal lower leg.  The pain is worse when getting up or twisting her knee.    She went to urgent care  6/7.  An xray showed mild tricompartmental OA.  She was prescribed prednisone  40 mg daily x 5 days - today is the last day - it has not helped.   She is not not sure why the pain started.  Has been going to [employer] - she did do some leg presses there.  She rode the bike.  She is not sure if that is the cause.  She was also putting away clothes in her closet and was doing a lot of twisting.  She also walks.    Medications and allergies reviewed with patient and updated if appropriate.  Current Outpatient Medications on File Prior to Visit  Medication Sig Dispense Refill   albuterol  (VENTOLIN  HFA) 108 (90 Base) MCG/ACT inhaler INHALE 2 PUFFS BY MOUTH EVERY 6 HOURS AS NEEDED FOR WHEEZING AND FOR SHORTNESS OF BREATH 9 g 0   atorvastatin  (LIPITOR) 10 MG tablet 1 TABLET MONDAY, WEDNESDAY, AND FRIDAY ONLY 90 tablet 3   clotrimazole -betamethasone  (LOTRISONE ) cream Apply 1 Application topically daily. 30 g 0   COD LIVER OIL PO Take by mouth.     dapagliflozin  propanediol (FARXIGA ) 10 MG TABS tablet Take 1 tablet by mouth once daily 90 tablet 1   losartan -hydrochlorothiazide  (HYZAAR) 100-25 MG tablet Take 1 tablet by mouth once daily 90 tablet 3   meloxicam  (MOBIC ) 15 MG tablet Take 1 tablet by mouth once daily 30 tablet 0   omeprazole  (PRILOSEC) 20 MG capsule Take 1 capsule by mouth once daily 90 capsule 0   oxyCODONE-acetaminophen  (PERCOCET) 10-325 MG tablet Take 1 tablet by  mouth as needed for pain.     spironolactone  (ALDACTONE ) 25 MG tablet TAKE 1 TABLET BY MOUTH ONCE DAILY . APPOINTMENT REQUIRED FOR FUTURE REFILLS 90 tablet 3   tirzepatide  (MOUNJARO ) 12.5 MG/0.5ML Pen Inject 12.5 mg into the skin once a week. 6 mL 0   umeclidinium-vilanterol (ANORO ELLIPTA ) 62.5-25 MCG/ACT AEPB Inhale 1 puff by mouth once daily 60 each 11   Vitamin D , Ergocalciferol , (DRISDOL ) 1.25 MG (50000 UNIT) CAPS capsule Take 1 capsule (50,000 Units total) by mouth every 7 (seven) days. 4 capsule 0   No current facility-administered medications on file prior to visit.    Review of Systems     Objective:   Vitals:   01/14/24 1501  BP: 136/74  Pulse: 82  Temp: 98.5 F (36.9 C)  SpO2: 93%   BP Readings from Last 3 Encounters:  01/14/24 136/74  01/10/24 118/79  12/31/23 107/73   Wt Readings from Last 3 Encounters:  01/14/24 229 lb (103.9 kg)  01/10/24 220 lb 14.4 oz (100.2 kg)  12/31/23 221 lb (100.2 kg)   Body mass index is 41.88 kg/m.    Physical Exam Constitutional:      General: She is  not in acute distress.    Appearance: Normal appearance. She is not ill-appearing.  Musculoskeletal:     Comments: Right knee:  SKIN: intact, no bruising SWELLING: Moderate EFFUSION: Possibly WARMTH: no warmth  TENDERNESS: Some tenderness with palpation throughout knee, no posterior knee tenderness, no calf tenderness ROM: full extension, full flexion  GAIT: walks with a limp  NEUROLOGICAL EXAM: normal sensation     Neurological:     Mental Status: She is alert.         DG Knee Complete 4 Views Right CLINICAL DATA:  Right knee pain and swelling for the past 2 weeks.  EXAM: RIGHT KNEE - COMPLETE 4+ VIEW  COMPARISON:  None Available.  FINDINGS: Mild tricompartmental degenerative changes with minimal progression. No fracture, dislocation or effusion seen.  IMPRESSION: Mild tricompartmental degenerative changes with minimal progression.  Electronically  Signed   By: Catherin Closs M.D.   On: 01/10/2024 17:33     Assessment & Plan:    Right knee pain Acute Started 2 weeks ago without obvious cause She did go to urgent care a few days ago x-ray showed mild tricompartmental osteoarthritis She was prescribed prednisone  40 mg daily x 5 days which did not help-today is her last day Referral to sports medicine Advise she can take over-the-counter pain medication for now Recommended icing Would not go to the gym at this point   Hypertension: Chronic Blood pressure adequately controlled Continue losartan -HCTZ 100-25 mg daily, spironolactone  25 mg daily

## 2024-01-19 ENCOUNTER — Other Ambulatory Visit: Payer: Self-pay

## 2024-01-19 ENCOUNTER — Ambulatory Visit (INDEPENDENT_AMBULATORY_CARE_PROVIDER_SITE_OTHER): Admitting: Family Medicine

## 2024-01-19 VITALS — BP 112/68 | HR 88 | Ht 62.0 in | Wt 225.0 lb

## 2024-01-19 DIAGNOSIS — G8929 Other chronic pain: Secondary | ICD-10-CM

## 2024-01-19 DIAGNOSIS — M25561 Pain in right knee: Secondary | ICD-10-CM | POA: Diagnosis not present

## 2024-01-19 DIAGNOSIS — M545 Low back pain, unspecified: Secondary | ICD-10-CM | POA: Diagnosis not present

## 2024-01-19 NOTE — Progress Notes (Signed)
 Joanna Muck, PhD, LAT, ATC acting as a scribe for Garlan Juniper, MD.  Leah Lawson is a 68 y.o. female who presents to [facility] today for R knee pain ongoing since the end of May. Pt was seen at [facility] on 6/7. She does recall doing some work around her house, using a footstool, moving clothing around. She also started a new fitness program, doing a leg press. She is unsure if either instance or both are related to the start of her knee pain.    Additionally she notes chronic low back pain without much radiation.  No weakness or numbness distally.  R Knee swelling: yes Mechanical symptoms: yes Aggravates: putting her shoe on, prolonged sitting Treatments tried: naproxen, prednisone , oxycodone (leftover from carpal tunnel surgery)  Dx imaging: 01/10/24 R knee XR  Pertinent review of systems: No fevers or chills  Relevant historical information: Hypertension and aortic atherosclerosis.  Diabetes.  Hyperlipidemia.   Exam:  BP 112/68   Pulse 88   Ht 5' 2 (1.575 m)   Wt 225 lb (102.1 kg)   SpO2 99%   BMI 41.15 kg/m  General: Well Developed, well nourished, and in no acute distress.   MSK: Right knee mild effusion otherwise normal-appearing Tender palpation medial joint line. Normal motion. Stable ligamentous exam.  L-spine: Normal appearing Nontender palpation spinal midline. Tender palpation paraspinal musculature.  Lab and Radiology Results  Procedure: Real-time Ultrasound Guided Injection of right knee joint superior lateral patella space Device: Philips Affiniti 50G/GE Logiq Images permanently stored and available for review in PACS Verbal informed consent obtained.  Discussed risks and benefits of procedure. Warned about infection, bleeding, hyperglycemia damage to structures among others. Patient expresses understanding and agreement Time-out conducted.   Noted no overlying erythema, induration, or other signs of local infection.    Skin prepped in a sterile fashion.   Local anesthesia: Topical Ethyl chloride.   With sterile technique and under real time ultrasound guidance: 40 mg of Kenalog  and 2 mL of Marcaine injected into knee joint. Fluid seen entering the joint capsule.   Completed without difficulty   Pain immediately resolved suggesting accurate placement of the medication.   Advised to call if fevers/chills, erythema, induration, drainage, or persistent bleeding.   Images permanently stored and available for review in the ultrasound unit.  Impression: Technically successful ultrasound guided injection.   DG Knee Complete 4 Views Right Result Date: 01/10/2024 CLINICAL DATA:  Right knee pain and swelling for the past 2 weeks. EXAM: RIGHT KNEE - COMPLETE 4+ VIEW COMPARISON:  None Available. FINDINGS: Mild tricompartmental degenerative changes with minimal progression. No fracture, dislocation or effusion seen. IMPRESSION: Mild tricompartmental degenerative changes with minimal progression. Electronically Signed   By: Catherin Closs M.D.   On: 01/10/2024 17:33   I, Garlan Juniper, personally (independently) visualized and performed the interpretation of the images attached in this note.     Assessment and Plan: 68 y.o. female with right chronic knee pain due to exacerbation of DJD.  Plan for steroid injection.  Also plan for physical therapy.  Recheck in about 3 months.  Return sooner if needed.  Chronic low back pain due to muscle dysfunction and weakness and degenerative changes.  Plan for physical therapy.  PDMP not reviewed this encounter. Orders Placed This Encounter  Procedures   US  LIMITED JOINT SPACE STRUCTURES LOW RIGHT(NO LINKED CHARGES)    Reason for Exam (SYMPTOM  OR DIAGNOSIS REQUIRED):   right knee pain  Preferred imaging location?:   [facility] referral to Physical Therapy    Referral Priority:   Routine    Referral Type:   Physical Medicine    Referral  Reason:   Specialty Services Required    Requested Specialty:   Physical Therapy    Number of Visits Requested:   1   No orders of the defined types were placed in this encounter.    Discussed warning signs or symptoms. Please see discharge instructions. Patient expresses understanding.   The above documentation has been reviewed and is accurate and complete Garlan Juniper, M.D.

## 2024-01-19 NOTE — Patient Instructions (Signed)
 Thank you for coming in today.   You received an injection today. Seek immediate medical attention if the joint becomes red, extremely painful, or is oozing fluid.   I've referred you to Physical Therapy.  Let us  know if you don't hear from them in one week.   I can repeat this shot in 3 months, if needed

## 2024-01-20 NOTE — Progress Notes (Signed)
 SUBJECTIVE: Discussed the use of AI scribe software for clinical note transcription with the patient, who gave verbal consent to proceed.  Chief Complaint: Obesity  Interim History: She is down 1 lb since last visit.  Down 21 lbs overall TBW loss of 8.7%  Leah Lawson is here to discuss her progress with her obesity treatment plan. She is on the Category 2 Plan and states she is following her eating plan approximately 75 % of the time. She states she is not exercising None due to right leg pain. Leah Lawson is a 68 year old female who presents for follow-up of her obesity treatment plan.  She has lost one pound since her last visit and adheres to a category two plan 75% of the time. She experiences difficulty exercising due to right leg pain. She is making better dietary choices and is mindful of her protein intake. She experiences boredom eating, particularly after 8 PM, and is considering engaging in hobbies to distract herself from eating out of boredom. We discussed finding some activities to occupy her in the evenings to avoid boredom eating, such as her crossword puzzles she enjoys or other similar activities.   She reports right leg pain that began approximately two weeks ago after moving items, which led her to the emergency room due to severe pain and inability to wear a shoe. She was informed that her x-rays showed arthritis, and she was referred to a sports medicine doctor. She experienced swelling and inflammation in the leg, and an MRI from the previous year was reviewed. She was scheduled for physical therapy starting in June. She received a shot that provided significant relief, although a five-day course of steroids was ineffective. She has been using oxycodone/acetaminophen  for pain management.  She has a history of type 2 diabetes, hyperlipidemia, obstructive sleep apnea on CPAP, hypertension, stage 3A chronic kidney disease, vitamin D  deficiency, and drug-induced constipation.  She is currently taking Mounjaro  and reports no issues with constipation, diarrhea, nausea, or vomiting.  OBJECTIVE: Visit Diagnoses: Problem List Items Addressed This Visit     Morbid obesity (HCC) with starting BMI 44   Relevant Medications   tirzepatide  (MOUNJARO ) 12.5 MG/0.5ML Pen   Vitamin D  deficiency   Relevant Medications   Vitamin D , Ergocalciferol , (DRISDOL ) 1.25 MG (50000 UNIT) CAPS capsule   Hyperlipidemia associated with type 2 diabetes mellitus (HCC)   Relevant Medications   tirzepatide  (MOUNJARO ) 12.5 MG/0.5ML Pen   Type 2 diabetes mellitus with obesity (HCC) - Primary   Relevant Medications   tirzepatide  (MOUNJARO ) 12.5 MG/0.5ML Pen   BMI 40.0-44.9, adult (HCC)   Relevant Medications   tirzepatide  (MOUNJARO ) 12.5 MG/0.5ML Pen   Other Visit Diagnoses       Primary osteoarthritis of right knee         Obesity Leah Lawson has lost 21 pounds in total, with a BMI now at 40. She follows a category two plan 75% of the time and experiences difficulty exercising due to right leg pain. Increased muscle mass is noted. Continued weight loss is emphasized to reduce joint stress and improve overall health. Non-food-based activities, such as crossword puzzles and knitting, are encouraged to combat boredom eating. - Continue current obesity treatment plan - Encourage adherence to category two plan - Focus on physical therapy for right leg pain - Encourage non-food-based activities to combat boredom eating - Refill Mounjaro  prescription  Type 2 Diabetes Mellitus with other specified complication, without long-term current use of insulin  HgbA1c is at goal. Last  A1c was 6.2  Medication(s): Mounjaro  12.5 mg SQ weekly and Farxiga  10 mg daily Denies mass in neck, dysphagia, dyspepsia, persistent hoarseness, abdominal pain, or N/V/Constipation or diarrhea. Has annual eye exam. Mood is stable.  Reports no SE with Farxiga .  Lab Results  Component Value Date   HGBA1C 6.2  10/17/2023   HGBA1C 6.1 04/30/2023   HGBA1C 7.5 (H) 11/28/2022   Lab Results  Component Value Date   MICROALBUR <0.7 10/17/2023   LDLCALC 120 (H) 10/17/2023   CREATININE 0.83 10/17/2023   Lab Results  Component Value Date   GFR 72.34 10/17/2023   GFR 59.46 (L) 04/30/2023   GFR 69.90 08/14/2022    Plan: Continue and refill Mounjaro  12.5 mg SQ weekly Continue Farxiga  per PCP She is working  on nutrition plan to decrease simple carbohydrates, increase lean proteins and exercise to promote weight loss and improve glycemic control.  Hyperlipidemia LDL is not at goal. Medication(s): Lipitor 10 mg every M-W-F. No SE with LIpitor Cardiovascular risk factors: advanced age (older than 29 for men, 43 for women), diabetes mellitus, dyslipidemia, hypertension, obesity (BMI >= 30 kg/m2), sedentary lifestyle, and smoking/ tobacco exposure  Lab Results  Component Value Date   CHOL 181 10/17/2023   HDL 43.10 10/17/2023   LDLCALC 120 (H) 10/17/2023   TRIG 88.0 10/17/2023   CHOLHDL 4 10/17/2023   CHOLHDL 5 04/30/2023   CHOLHDL 4.6 (H) 11/28/2022   Lab Results  Component Value Date   ALT 17 10/17/2023   AST 19 10/17/2023   ALKPHOS 63 10/17/2023   BILITOT 0.6 10/17/2023   The 10-year ASCVD risk score (Arnett DK, et al., 2019) is: 16.6%   Values used to calculate the score:     Age: 12 years     Clincally relevant sex: Female     Is Non-Hispanic African American: Yes     Diabetic: Yes     Tobacco smoker: No     Systolic Blood Pressure: 110 mmHg     Is BP treated: Yes     HDL Cholesterol: 43.1 mg/dL     Total Cholesterol: 181 mg/dL  Plan: Continue Lipitor 10 mg M-W-F COntinue Mounjaro  and Farxiga  Continue to work on nutrition plan -decreasing simple carbohydrates, increasing lean proteins, decreasing saturated fats and cholesterol , avoiding trans fats and exercise as able to promote weight loss, improve lipids and decrease cardiovascular risks. Recheck lipid panel in 3-4 months.     Right Knee Osteoarthritis Chronic right knee pain exacerbated after moving items two weeks ago. Diagnosed with mild tricompartmental degenerative changes indicating arthritis. A steroid injection provided significant relief. Arthritis is aggravated by extra weight and leg presses. Pool walking is suggested to reduce joint stress. - Continue physical therapy for right knee - Avoid leg presses and high-impact exercises - Consider pool walking to reduce joint stress  Vitamin D  Deficiency Vitamin D  is not at goal of 50.  Most recent vitamin D  level was 21.05. She is on  prescription ergocalciferol  50,000 IU weekly. No N/V or muscle weakness with Ergocalciferol  Lab Results  Component Value Date   VD25OH 21.05 (L) 10/17/2023   VD25OH 35.1 11/28/2022   VD25OH 19.68 (L) 08/14/2022    Plan: Continue and refill  prescription ergocalciferol  50,000 IU weekly Low vitamin D  levels can be associated with adiposity and may result in leptin resistance and weight gain. Also associated with fatigue.  Currently on vitamin D  supplementation without any adverse effects such as nausea, vomiting or muscle weakness.  Recheck level  in 3-4 months.   Vitals Temp: 98.2 F (36.8 C) BP: 110/74 Pulse Rate: 89 SpO2: 97 %   Anthropometric Measurements Height: 5' 2 (1.575 m) Weight: 220 lb (99.8 kg) BMI (Calculated): 40.23 Weight at Last Visit: 221 lb Weight Lost Since Last Visit: 1 lb Weight Gained Since Last Visit: 0 Starting Weight: 241 lb Total Weight Loss (lbs): 21 lb (9.526 kg) Peak Weight: 250 lb   Body Composition  Body Fat %: 45.4 % Fat Mass (lbs): 100 lbs Muscle Mass (lbs): 114.4 lbs Total Body Water (lbs): 75.6 lbs Visceral Fat Rating : 16   Other Clinical Data Fasting: No Labs: No Today's Visit #: 17 Starting Date: 11/27/22     ASSESSMENT AND PLAN:  Diet: Leah Lawson is currently in the action stage of change. As such, her goal is to continue with weight loss efforts. She  has agreed to Category 2 Plan.  Exercise: Leah Lawson has been instructed to try a geriatric exercise plan and that some exercise is better than none for weight loss and overall health benefits.   Behavior Modification:  We discussed the following Behavioral Modification Strategies today: increasing lean protein intake, decreasing simple carbohydrates, increasing vegetables, increase H2O intake, increase high fiber foods, no skipping meals, meal planning and cooking strategies, ways to avoid boredom eating, emotional eating strategies , avoiding temptations, and planning for success. We discussed various medication options to help Leah Lawson with her weight loss efforts and we both agreed to continue current treatment plan, continue to work on nutritional and behavioral strategies to promote weight loss.  .  Return in about 4 weeks (around 02/18/2024).Aaron Aas She was informed of the importance of frequent follow up visits to maximize her success with intensive lifestyle modifications for her multiple health conditions.  Attestation Statements:   Reviewed by clinician on day of visit: allergies, medications, problem list, medical history, surgical history, family history, social history, and previous encounter notes.   Time spent on visit including pre-visit chart review and post-visit care and charting was 31 minutes.    Leah Bier, PA-C

## 2024-01-21 ENCOUNTER — Encounter (INDEPENDENT_AMBULATORY_CARE_PROVIDER_SITE_OTHER): Payer: Self-pay | Admitting: Physician Assistant

## 2024-01-21 ENCOUNTER — Ambulatory Visit (INDEPENDENT_AMBULATORY_CARE_PROVIDER_SITE_OTHER): Admitting: Physician Assistant

## 2024-01-21 VITALS — BP 110/74 | HR 89 | Temp 98.2°F | Ht 62.0 in | Wt 220.0 lb

## 2024-01-21 DIAGNOSIS — M1711 Unilateral primary osteoarthritis, right knee: Secondary | ICD-10-CM | POA: Diagnosis not present

## 2024-01-21 DIAGNOSIS — G4733 Obstructive sleep apnea (adult) (pediatric): Secondary | ICD-10-CM

## 2024-01-21 DIAGNOSIS — K5903 Drug induced constipation: Secondary | ICD-10-CM

## 2024-01-21 DIAGNOSIS — Z7985 Long-term (current) use of injectable non-insulin antidiabetic drugs: Secondary | ICD-10-CM

## 2024-01-21 DIAGNOSIS — E1169 Type 2 diabetes mellitus with other specified complication: Secondary | ICD-10-CM | POA: Diagnosis not present

## 2024-01-21 DIAGNOSIS — E559 Vitamin D deficiency, unspecified: Secondary | ICD-10-CM | POA: Diagnosis not present

## 2024-01-21 DIAGNOSIS — E785 Hyperlipidemia, unspecified: Secondary | ICD-10-CM | POA: Diagnosis not present

## 2024-01-21 DIAGNOSIS — E1159 Type 2 diabetes mellitus with other circulatory complications: Secondary | ICD-10-CM

## 2024-01-21 DIAGNOSIS — Z6841 Body Mass Index (BMI) 40.0 and over, adult: Secondary | ICD-10-CM

## 2024-01-21 MED ORDER — VITAMIN D (ERGOCALCIFEROL) 1.25 MG (50000 UNIT) PO CAPS
50000.0000 [IU] | ORAL_CAPSULE | ORAL | 0 refills | Status: DC
Start: 2024-01-21 — End: 2024-02-16

## 2024-01-21 MED ORDER — TIRZEPATIDE 12.5 MG/0.5ML ~~LOC~~ SOAJ
12.5000 mg | SUBCUTANEOUS | 0 refills | Status: DC
Start: 2024-01-21 — End: 2024-02-16

## 2024-01-30 ENCOUNTER — Other Ambulatory Visit: Payer: Self-pay | Admitting: Pulmonary Disease

## 2024-01-30 ENCOUNTER — Other Ambulatory Visit: Payer: Self-pay | Admitting: Family Medicine

## 2024-01-30 ENCOUNTER — Other Ambulatory Visit (HOSPITAL_BASED_OUTPATIENT_CLINIC_OR_DEPARTMENT_OTHER): Payer: Self-pay

## 2024-01-30 DIAGNOSIS — M5136 Other intervertebral disc degeneration, lumbar region with discogenic back pain only: Secondary | ICD-10-CM

## 2024-01-30 DIAGNOSIS — M545 Low back pain, unspecified: Secondary | ICD-10-CM

## 2024-01-30 MED ORDER — ALBUTEROL SULFATE HFA 108 (90 BASE) MCG/ACT IN AERS: 1.0000 | INHALATION_SPRAY | Freq: Four times a day (QID) | RESPIRATORY_TRACT | 3 refills | Status: DC | PRN

## 2024-02-04 ENCOUNTER — Ambulatory Visit (AMBULATORY_SURGERY_CENTER)

## 2024-02-04 VITALS — Ht 61.5 in | Wt 223.0 lb

## 2024-02-04 DIAGNOSIS — Z800 Family history of malignant neoplasm of digestive organs: Secondary | ICD-10-CM

## 2024-02-04 DIAGNOSIS — Z860100 Personal history of colon polyps, unspecified: Secondary | ICD-10-CM

## 2024-02-04 MED ORDER — NA SULFATE-K SULFATE-MG SULF 17.5-3.13-1.6 GM/177ML PO SOLN: 1.0000 | Freq: Once | ORAL | 0 refills | Status: AC

## 2024-02-04 NOTE — Therapy (Incomplete)
 OUTPATIENT PHYSICAL THERAPY THORACOLUMBAR EVALUATION   Patient Name: Leah Lawson MRN: [id number] DOB:08-27-1955, 68 y.o., female Today's Date: 02/05/2024  END OF SESSION:  PT End of Session - 02/05/24 1141     Visit Number 1    Authorization Type BC[employer]    PT Start Time 1145    PT Stop Time 1230    PT Time Calculation (min) 45 min          Past Medical History:  Diagnosis Date   Allergy    Arthritis    Asthma    Back pain    Cataract    Chronic kidney disease    Closed fracture of distal end of left radius 11/15/2022   Constipation    COPD (chronic obstructive pulmonary disease) (HCC)    COPD (chronic obstructive pulmonary disease) (HCC)    Diabetes (HCC)    Diabetes mellitus without complication (HCC)    pre-diabetic   Emphysema of lung (HCC)    Glaucoma    Heartburn    High cholesterol    Hypertension    Hypertensive retinopathy 10/26/2021   Lactose intolerance    Open-angle glaucoma 10/26/2021   Other specified disorders of thyroid     Sleep apnea    SOB (shortness of breath)    Vitamin D  deficiency    Past Surgical History:  Procedure Laterality Date   BREAST SURGERY     CARPAL TUNNEL RELEASE Left 12/2023   COLONOSCOPY     WRIST SURGERY Left 11/18/2022   Patient Active Problem List   Diagnosis Date Noted   Bilateral low back pain without sciatica 06/22/2023   Calculus of gallbladder without cholecystitis without obstruction 06/22/2023   Right renal stone 06/22/2023   Drug-induced constipation 06/04/2023   Inflammatory pain 05/09/2023   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   Left wrist pain 03/16/2023   Intertrigo 12/17/2022   Insulin  resistance 12/11/2022   Stage 3a chronic kidney disease (HCC) 12/11/2022   Other fatigue 11/27/2022   Essential hypertension 11/27/2022   SOBOE (shortness of breath on exertion) 11/27/2022   Type 2 diabetes mellitus with other specified complication (HCC) 11/27/2022   BMI 40.0-44.9, adult (HCC)  11/27/2022   Depression screen 11/27/2022   Depression 11/27/2022   Lumbar disc disease 10/10/2022   Nocturnal hypoxia 07/12/2022   OSA on CPAP 02/28/2022   Contact dermatitis 02/01/2022   Polycythemia 01/31/2022   Statin declined 01/31/2022   Type 2 diabetes mellitus with obesity (HCC) 01/31/2022   Hypertensive retinopathy 10/26/2021   Open-angle glaucoma 10/26/2021   Vitamin D  deficiency 10/18/2021   Pulmonary emphysema (HCC) 10/18/2021   Aortic atherosclerosis (HCC) 10/18/2021   Multiple thyroid  nodules 10/18/2021   Osteopenia 10/18/2021   Hyperlipidemia associated with type 2 diabetes mellitus (HCC) 10/18/2021   Morbid obesity (HCC) with starting BMI 44 11/25/2019   Tobacco abuse 05/08/2018   COPD exacerbation (HCC) 05/08/2018   Diabetes mellitus without complication (HCC)     PCP: Boby Mackintosh  REFERRING PROVIDER: Joane Birmingham  REFERRING DIAG:  [phone] (ICD-10-CM) - Chronic pain of right knee  M54.50,G89.29 (ICD-10-CM) - Chronic bilateral low back pain without sciatica    Rationale for Evaluation and Treatment: Rehabilitation  THERAPY DIAG:  No diagnosis found.  ONSET DATE: 01/19/24  SUBJECTIVE:  SUBJECTIVE STATEMENT: I am fair, a lot better but from time to time the pain gets to hitting. Doctor gave me a shot and I can get to moving and bending it but it still pops a lot and the pain is there from time to time. Sometimes my back hurts too.   PERTINENT HISTORY:  Leah Lawson is a 68 y.o. female who presents to [facility] today for R knee pain ongoing since the end of May. Pt was seen at [facility] on 6/7. She does recall doing some work around her house, using a footstool, moving clothing around. She also started a new fitness program, doing a leg press.  She is unsure if either instance or both are related to the start of her knee pain.  Additionally she notes chronic low back pain without much radiation.  No weakness or numbness distally.   PAIN:  Are you having pain? Yes: NPRS scale: 6/10 Pain location: R knee sometimes radiates into lower leg, low back Pain description: stiff, popping, aches, worse in mornings   Aggravating factors: prolonged sitting, walking makes my sides hurt Relieving factors: sometimes the medicine  PRECAUTIONS: None  RED FLAGS: None   WEIGHT BEARING RESTRICTIONS: No  FALLS:  Has patient fallen in last 6 months? No  LIVING ENVIRONMENT: Lives with: lives alone Lives in: House/apartment Stairs: No Has following equipment at home: None  OCCUPATION: Retired  PLOF: Independent  PATIENT GOALS:  want my leg and back to stop hurting  NEXT MD VISIT:   OBJECTIVE:  Note: Objective measures were completed at Evaluation unless otherwise noted.  DIAGNOSTIC FINDINGS:  Mild tricompartmental degenerative changes with minimal progression.  COGNITION: Overall cognitive status: Within functional limits for tasks assessed     SENSATION: WFL  POSTURE: rounded shoulders  PALPATION: Some TTP medial knee  LUMBAR ROM:   AROM eval  Flexion WNL  Extension WFL pain at end range  Right lateral flexion WFL  Left lateral flexion WFL  Right rotation 75% some pain  Left rotation 75% some pain   (Blank rows = not tested)  LOWER EXTREMITY ROM:  WNL, a slight pulling sensation with R knee flexion   LOWER EXTREMITY MMT:  grossly 5/5    FUNCTIONAL TESTS:  5 times sit to stand: 15s   TREATMENT DATE:  02/05/24 EVAL                                                                                                                                 PATIENT EDUCATION:  Education details: POC, HEP Person educated: Patient Education method: Explanation Education comprehension: verbalized understanding  HOME  EXERCISE PROGRAM: Access Code: [email or web address] URL: [email or web address] Date: 02/05/2024 Prepared by: Almetta Fam  Exercises - Supine Lower Trunk Rotation  - 1 x daily - 7 x weekly - 2 sets - 10 reps - Supine Bridge  - 1 x daily - 7 x weekly - 2 sets -  10 reps - Supine Single Knee to Chest Stretch  - 1 x daily - 7 x weekly - 2 reps - 15 hold - Clamshell with Resistance  - 1 x daily - 7 x weekly - 2 sets - 10 reps - Sit to Stand  - 1 x daily - 7 x weekly - 2 sets - 10 reps  ASSESSMENT:  CLINICAL IMPRESSION: Patient is a 68 y.o. female who was seen today for physical therapy evaluation and treatment for right chronic knee pain due to exacerbation of DJD.  She had one steroid injection and relieved a lot of her pain. She is able to due her normal activity but she still has pain at times and reports some popping. Patient also has some chronic low back pain which may be due to muscle dysfunction, weakness and degenerative changes.      OBJECTIVE IMPAIRMENTS: difficulty walking, decreased ROM, improper body mechanics, and pain.   ACTIVITY LIMITATIONS: sitting, standing, squatting, and locomotion level  PARTICIPATION LIMITATIONS: cleaning, community activity, and yard work  PERSONAL FACTORS: Age, Behavior pattern, and Time since onset of injury/illness/exacerbation are also affecting patient's functional outcome.   REHAB POTENTIAL: Good  CLINICAL DECISION MAKING: Stable/uncomplicated  EVALUATION COMPLEXITY: Low  GOALS: Goals reviewed with patient? Yes  SHORT TERM GOALS: Target date:03/04/24  Patient will be independent with initial HEP.  Baseline:  Goal status: INITIAL  2.  Patient will demonstrate improved functional strength as demonstrated by 5xSTS <12s. Baseline: 15s Goal status: INITIAL   LONG TERM GOALS: Target date: 04/15/24  Patient will be independent with advanced/ongoing HEP to improve outcomes and carryover.  Baseline:  Goal status: INITIAL  2.   Patient will report 50-75% improvement in R knee and low back pain to improve QOL.  Baseline: 6/10 Goal status: INITIAL  3.  Patient will demonstrate full pain free lumbar ROM to perform ADLs.   Baseline:  Goal status: INITIAL  4.  Patient will be able to return to her fitness program without pain Baseline: stopped going since pain started Goal status: INITIAL   PLAN:  PT FREQUENCY: 1x/week  PT DURATION: 8 weeks  PLANNED INTERVENTIONS: 97110-Therapeutic exercises, 97530- Therapeutic activity, 97112- Neuromuscular re-education, 97535- Self Care, 02859- Manual therapy, 20560 (1-2 muscles), 20561 (3+ muscles)- Dry Needling, Patient/Family education, Balance training, Stair training, Taping, Joint mobilization, Joint manipulation, Spinal manipulation, Spinal mobilization, Cryotherapy, and Moist heat.  PLAN FOR NEXT SESSION: low back mobility, strengthening to LE's more on R side for knee   Almetta Fam, PT 02/05/2024, 12:33 PM

## 2024-02-04 NOTE — Progress Notes (Signed)
 No egg or soy allergy known to patient  No issues known to pt with past sedation with any surgeries or procedures Patient denies ever being told they had issues or difficulty with intubation  No FH of Malignant Hyperthermia Pt is not on diet pills Pt uses home 02 at nighttime only Uses C-Pap at night along with O2 (nighttime only) Pt is not on blood thinners  Pt denies issues with chronic constipation  No A fib or A flutter Have any cardiac testing pending--no Pt instructed to use [email or web address] or GoodRx for a price reduction on prep  Ambulates independently

## 2024-02-05 ENCOUNTER — Ambulatory Visit: Attending: Family Medicine

## 2024-02-05 DIAGNOSIS — M545 Low back pain, unspecified: Secondary | ICD-10-CM | POA: Insufficient documentation

## 2024-02-05 DIAGNOSIS — M25561 Pain in right knee: Secondary | ICD-10-CM | POA: Insufficient documentation

## 2024-02-05 DIAGNOSIS — G8929 Other chronic pain: Secondary | ICD-10-CM | POA: Insufficient documentation

## 2024-02-05 DIAGNOSIS — M5459 Other low back pain: Secondary | ICD-10-CM | POA: Diagnosis present

## 2024-02-12 ENCOUNTER — Telehealth: Payer: Self-pay | Admitting: Physician Assistant

## 2024-02-12 NOTE — Telephone Encounter (Signed)
 Called to reschedule patient appointment to due provider pal  request. I talked  to patient and they are aware of the changes that was made to the upcoming appointment

## 2024-02-13 ENCOUNTER — Other Ambulatory Visit (INDEPENDENT_AMBULATORY_CARE_PROVIDER_SITE_OTHER): Payer: Self-pay | Admitting: Physician Assistant

## 2024-02-13 DIAGNOSIS — E559 Vitamin D deficiency, unspecified: Secondary | ICD-10-CM

## 2024-02-13 LAB — COMPREHENSIVE METABOLIC PANEL WITH GFR
ALT: 15 IU/L (ref 0–32)
AST: 14 IU/L (ref 0–40)
Albumin: 4.3 g/dL (ref 3.9–4.9)
Alkaline Phosphatase: 92 IU/L (ref 44–121)
BUN/Creatinine Ratio: 31 — ABNORMAL HIGH (ref 12–28)
BUN: 28 mg/dL — ABNORMAL HIGH (ref 8–27)
Bilirubin Total: 0.4 mg/dL (ref 0.0–1.2)
CO2: 23 mmol/L (ref 20–29)
Calcium: 9.7 mg/dL (ref 8.7–10.3)
Chloride: 102 mmol/L (ref 96–106)
Creatinine, Ser: 0.89 mg/dL (ref 0.57–1.00)
Globulin, Total: 2.6 g/dL (ref 1.5–4.5)
Glucose: 97 mg/dL (ref 70–99)
Potassium: 4.9 mmol/L (ref 3.5–5.2)
Sodium: 143 mmol/L (ref 134–144)
Total Protein: 6.9 g/dL (ref 6.0–8.5)
eGFR: 71 mL/min/1.73 (ref >59)

## 2024-02-13 LAB — LIPID PANEL
Chol/HDL Ratio: 2.9 ratio (ref 0.0–4.4)
Cholesterol, Total: 158 mg/dL (ref 100–199)
HDL: 55 mg/dL (ref >39)
LDL Chol Calc (NIH): 91 mg/dL (ref 0–99)
Triglycerides: 57 mg/dL (ref 0–149)
VLDL Cholesterol Cal: 12 mg/dL (ref 5–40)

## 2024-02-15 NOTE — Progress Notes (Unsigned)
 SUBJECTIVE: Discussed the use of AI scribe software for clinical note transcription with the patient, who gave verbal consent to proceed.  Chief Complaint: Obesity  Interim History: She has maintained her weight since last visit.  Down 21 lbs overall TBW loss of 8.7%  Leah Lawson is here to discuss her progress with her obesity treatment plan. She is on the Category 2 Plan and states she is following her eating plan approximately 95 % of the time. She states she is not exercising due to severe right knee/leg pain for the past month.   Leah Lawson is a 68 year old female with obesity and type 2 diabetes who presents for follow-up of her obesity treatment plan.  She has been experiencing ongoing knee pain, worsening over the past six weeks. The pain is described as an ache, primarily located at the back of the knee, and sometimes radiates down the leg. It is exacerbated by sitting and somewhat alleviated by oxycodone. No burning pain, but she experiences numbness in her toes and difficulty finding a comfortable position. She has not been able to engage in physical activity as desired due to discomfort. Physical therapy was attempted but was ineffective due to difficulty with the exercises. A steroid injection provided temporary relief for about a week and a half. She has not used Voltaren gel and is not currently taking Mobic . She does have a colonoscopy tomorrow and is unable to take NSAID's or ASA currently in prep for the study.   She is currently on Mounjaro  12.5 mg weekly and Farxiga  10 mg daily for diabetes management. She is also taking ergocalciferol  50,000 units weekly for vitamin D  deficiency, losartan  hydrochlorothiazide  100/25 mg daily for hypertension, and Lipitor 10 mg every Monday, Wednesday, and Friday for hyperlipidemia.  She has constipation issues and is scheduled for a colonoscopy tomorrow. She has been using more oxycodone to manage her knee pain.  Her recent labs showed a  GFR of 71. She was fasting for recent lab tests, which may have affected her hydration status and lab results. Her sodium and potassium levels were normal, though her BUN was slightly elevated, likely due to dehydration from fasting.  She has not experienced significant weight change recently, which she attributes to decreased mobility due to knee pain. She is 21 pounds down from her previous weight and is on a plan to lose more weight to aid in diabetes control.  OBJECTIVE: Visit Diagnoses: Problem List Items Addressed This Visit     Morbid obesity (HCC) with starting BMI 44   Relevant Medications   tirzepatide  (MOUNJARO ) 12.5 MG/0.5ML Pen   Vitamin D  deficiency   Relevant Medications   Vitamin D , Ergocalciferol , (DRISDOL ) 1.25 MG (50000 UNIT) CAPS capsule   Hyperlipidemia associated with type 2 diabetes mellitus (HCC)   Relevant Medications   tirzepatide  (MOUNJARO ) 12.5 MG/0.5ML Pen   Type 2 diabetes mellitus with obesity (HCC) - Primary   Relevant Medications   tirzepatide  (MOUNJARO ) 12.5 MG/0.5ML Pen   Drug-induced constipation   Other Visit Diagnoses       Hypertension associated with diabetes (HCC)       Relevant Medications   tirzepatide  (MOUNJARO ) 12.5 MG/0.5ML Pen     Obesity Obesity management ongoing . She is on Mounjaro  12.5 mg weekly for Type 2 diabetes management.  Weight stable, but mobility limited due to knee pain, affecting weight loss efforts. She is 21 pounds down from baseline. Emphasized the importance of weight loss for improving diabetic control and overall health.  She has had some difficulty focusing on weight loss over the past month due to knee pain.  Hopefully further weight loss may help to alleviate some of the pain she is experiencing.  - Continue Mounjaro  12.5 mg weekly - Encourage weight loss to aid in diabetic control and overall health She is working  on nutrition plan to decrease simple carbohydrates, increase lean proteins and exercise to  promote weight loss and improve glycemic control .  Knee Pain Knee pain with mild tricompartmental degenerative changes on x-ray. Pain persists for six weeks, worsening, and unrelieved by steroid injection. Located at the back of the knee, sometimes radiating down the leg. Increased oxycodone use for pain relief. Physical therapy not tolerated due to pain. Differential includes degenerative joint disease and possible cartilage damage not visible on x-ray.  - She is going to see Dr. Artist Lloyd for further evaluation and recommendations - Discuss the use of Voltaren gel for pain management after colonoscopy - Consider MRI of the back if symptoms suggest radiculopathy   Type 2 Diabetes Mellitus Type 2 diabetes well-controlled with Mounjaro  and Farxiga . Kidney function improved, likely due to better diabetic control. Glucose levels stable, but A1c not checked during recent labs. Slight elevation in BUN likely due to dehydration from fasting, not a concern given overall improvement in kidney function. Lab Results  Component Value Date   HGBA1C 6.2 10/17/2023   HGBA1C 6.1 04/30/2023   HGBA1C 7.5 (H) 11/28/2022   Lab Results  Component Value Date   MICROALBUR <0.7 10/17/2023   LDLCALC 91 02/12/2024   CREATININE 0.89 02/12/2024   She is working  on nutrition plan to decrease simple carbohydrates, increase lean proteins and exercise to promote weight loss and improve glycemic control . - Continue/refill  Mounjaro  and  Continue Farxiga  as prescribed by PCP - Monitor glucose levels and plan for A1c check in a few months along with other labs Meds ordered this encounter  Medications   Vitamin D , Ergocalciferol , (DRISDOL ) 1.25 MG (50000 UNIT) CAPS capsule    Sig: Take 1 capsule (50,000 Units total) by mouth every 7 (seven) days.    Dispense:  4 capsule    Refill:  0   tirzepatide  (MOUNJARO ) 12.5 MG/0.5ML Pen    Sig: Inject 12.5 mg into the skin once a week.    Dispense:  6 mL    Refill:  0     Hypertension Hypertension well-controlled with losartan -hydrochlorothiazide . Recent blood pressure reading was 113/79 mmHg despite pain. BP Readings from Last 3 Encounters:  02/16/24 113/79  01/21/24 110/74  01/19/24 112/68   Lab Results  Component Value Date   NA 143 02/12/2024   CL 102 02/12/2024   K 4.9 02/12/2024   CO2 23 02/12/2024   BUN 28 (H) 02/12/2024   CREATININE 0.89 02/12/2024   EGFR 71 02/12/2024   CALCIUM  9.7 02/12/2024   ALBUMIN 4.3 02/12/2024   GLUCOSE 97 02/12/2024   Continue to work on nutrition plan to promote weight loss and improve BP control.  - Continue losartan -hydrochlorothiazide  as prescribed  Hyperlipidemia Hyperlipidemia managed with Lipitor. Recent labs show improved cholesterol levels, but LDL not yet at goal for diabetic patients. Total cholesterol and triglycerides improved, and HDL higher than previous levels. Lab Results  Component Value Date   CHOL 158 02/12/2024   CHOL 181 10/17/2023   CHOL 175 04/30/2023   Lab Results  Component Value Date   HDL 55 02/12/2024   HDL 43.10 10/17/2023   HDL 37.80 (L) 04/30/2023  Lab Results  Component Value Date   LDLCALC 91 02/12/2024   LDLCALC 120 (H) 10/17/2023   LDLCALC 114 (H) 04/30/2023   Lab Results  Component Value Date   TRIG 57 02/12/2024   TRIG 88.0 10/17/2023   TRIG 115.0 04/30/2023   Lab Results  Component Value Date   CHOLHDL 2.9 02/12/2024   CHOLHDL 4 10/17/2023   CHOLHDL 5 04/30/2023   No results found for: LDLDIRECT Continue to work on nutrition plan -decreasing simple carbohydrates, increasing lean proteins, decreasing saturated fats and cholesterol , avoiding trans fats and exercise as able to promote weight loss, improve lipids and decrease cardiovascular risks. - Continue Lipitor 10 mg M-W-F as prescribed - Monitor cholesterol levels and aim for LDL goal of 55 mg/dL  Vitamin D  Deficiency Vitamin D  deficiency managed with ergocalciferol  50,000 units weekly. She  is due for a refill. No N/V or muscle weakness with Ergocalciferol .  Last vitamin D  Lab Results  Component Value Date   VD25OH 21.05 (L) 10/17/2023   Low vitamin D  levels can be associated with adiposity and may result in leptin resistance and weight gain. Also associated with fatigue.  Currently on vitamin D  supplementation without any adverse effects such as nausea, vomiting or muscle weakness.  Recheck vitamin D  levels with next labs over the next 2-3 months.  - Refill ergocalciferol  prescription  Vitals Temp: 98.3 F (36.8 C) BP: 113/79 Pulse Rate: 68 SpO2: 95 %   Anthropometric Measurements Height: 5' 2 (1.575 m) Weight: 220 lb (99.8 kg) BMI (Calculated): 40.23 Weight at Last Visit: 220 lb Weight Lost Since Last Visit: 0 Weight Gained Since Last Visit: 0 Starting Weight: 241 lb Total Weight Loss (lbs): 21 lb (9.526 kg) Peak Weight: 250 lb   Body Composition  Body Fat %: 46.8 % Fat Mass (lbs): 103.2 lbs Muscle Mass (lbs): 111.4 lbs Total Body Water (lbs): 76.8 lbs Visceral Fat Rating : 16   Other Clinical Data Fasting: No Labs: No Today's Visit #: 18 Starting Date: 11/27/22     ASSESSMENT AND PLAN:  Diet: Shazia is currently in the action stage of change. As such, her goal is to continue with weight loss efforts. She has agreed to Category 2 Plan.  Exercise: Zitlaly has been instructed to try a geriatric exercise plan and that some exercise is better than none for weight loss and overall health benefits.   Behavior Modification:  We discussed the following Behavioral Modification Strategies today: increasing lean protein intake, decreasing simple carbohydrates, increasing vegetables, increase H2O intake, increase high fiber foods, no skipping meals, meal planning and cooking strategies, avoiding temptations, and planning for success. We discussed various medication options to help Rashika with her weight loss efforts and we both agreed to continue  current treatment plan.  Return in about 4 weeks (around 03/15/2024).SABRA She was informed of the importance of frequent follow up visits to maximize her success with intensive lifestyle modifications for her multiple health conditions.  Attestation Statements:   Reviewed by clinician on day of visit: allergies, medications, problem list, medical history, surgical history, family history, social history, and previous encounter notes.   Time spent on visit including pre-visit chart review and post-visit care and charting was 29 minutes.    Shonya Sumida, PA-C

## 2024-02-16 ENCOUNTER — Ambulatory Visit: Payer: Self-pay | Admitting: Cardiovascular Disease

## 2024-02-16 ENCOUNTER — Encounter (INDEPENDENT_AMBULATORY_CARE_PROVIDER_SITE_OTHER): Payer: Self-pay | Admitting: Physician Assistant

## 2024-02-16 ENCOUNTER — Ambulatory Visit (INDEPENDENT_AMBULATORY_CARE_PROVIDER_SITE_OTHER): Admitting: Physician Assistant

## 2024-02-16 ENCOUNTER — Telehealth: Payer: Self-pay | Admitting: Family Medicine

## 2024-02-16 VITALS — BP 113/79 | HR 68 | Temp 98.3°F | Ht 62.0 in | Wt 220.0 lb

## 2024-02-16 DIAGNOSIS — Z7985 Long-term (current) use of injectable non-insulin antidiabetic drugs: Secondary | ICD-10-CM

## 2024-02-16 DIAGNOSIS — Z6841 Body Mass Index (BMI) 40.0 and over, adult: Secondary | ICD-10-CM

## 2024-02-16 DIAGNOSIS — E1159 Type 2 diabetes mellitus with other circulatory complications: Secondary | ICD-10-CM | POA: Diagnosis not present

## 2024-02-16 DIAGNOSIS — E559 Vitamin D deficiency, unspecified: Secondary | ICD-10-CM | POA: Diagnosis not present

## 2024-02-16 DIAGNOSIS — E1169 Type 2 diabetes mellitus with other specified complication: Secondary | ICD-10-CM

## 2024-02-16 DIAGNOSIS — E785 Hyperlipidemia, unspecified: Secondary | ICD-10-CM | POA: Diagnosis not present

## 2024-02-16 DIAGNOSIS — K5903 Drug induced constipation: Secondary | ICD-10-CM

## 2024-02-16 DIAGNOSIS — I152 Hypertension secondary to endocrine disorders: Secondary | ICD-10-CM

## 2024-02-16 MED ORDER — VITAMIN D (ERGOCALCIFEROL) 1.25 MG (50000 UNIT) PO CAPS: 50000.0000 [IU] | ORAL_CAPSULE | ORAL | 0 refills | Status: DC

## 2024-02-16 MED ORDER — TIRZEPATIDE 12.5 MG/0.5ML ~~LOC~~ SOAJ: 12.5000 mg | SUBCUTANEOUS | 0 refills | Status: DC

## 2024-02-16 NOTE — Telephone Encounter (Signed)
 Patient called and said shot only worked for a week. Pain is going from back to the front. She is doing ice and it is not doing anything it is supposed to do. Pain is getting worse. She has problems trying to lay down and sit up. She thinks something else may be going on with it. When she tries to do PT at home it hurts very bad. When she tries to walk it hits her. FYI. Patient is scheduled for next week.

## 2024-02-17 ENCOUNTER — Ambulatory Visit (AMBULATORY_SURGERY_CENTER): Admitting: Gastroenterology

## 2024-02-17 ENCOUNTER — Encounter: Payer: Self-pay | Admitting: Gastroenterology

## 2024-02-17 VITALS — BP 102/56 | HR 64 | Temp 97.3°F | Resp 14 | Ht 62.0 in | Wt 223.0 lb

## 2024-02-17 DIAGNOSIS — Z860100 Personal history of colon polyps, unspecified: Secondary | ICD-10-CM

## 2024-02-17 DIAGNOSIS — D123 Benign neoplasm of transverse colon: Secondary | ICD-10-CM | POA: Diagnosis not present

## 2024-02-17 DIAGNOSIS — K635 Polyp of colon: Secondary | ICD-10-CM

## 2024-02-17 DIAGNOSIS — K621 Rectal polyp: Secondary | ICD-10-CM

## 2024-02-17 DIAGNOSIS — D124 Benign neoplasm of descending colon: Secondary | ICD-10-CM

## 2024-02-17 DIAGNOSIS — Z800 Family history of malignant neoplasm of digestive organs: Secondary | ICD-10-CM

## 2024-02-17 DIAGNOSIS — K5730 Diverticulosis of large intestine without perforation or abscess without bleeding: Secondary | ICD-10-CM

## 2024-02-17 DIAGNOSIS — Z1211 Encounter for screening for malignant neoplasm of colon: Secondary | ICD-10-CM

## 2024-02-17 DIAGNOSIS — D128 Benign neoplasm of rectum: Secondary | ICD-10-CM

## 2024-02-17 MED ORDER — SODIUM CHLORIDE 0.9 % IV SOLN: 500.0000 mL | INTRAVENOUS | Status: DC

## 2024-02-17 NOTE — Progress Notes (Signed)
 Patient states there have been no changes to medical or surgical history since time of pre-visit.

## 2024-02-17 NOTE — Patient Instructions (Signed)
 YOU HAD AN ENDOSCOPIC PROCEDURE TODAY AT THE [facility]:   Refer to the procedure report that was given to you for any specific questions about what was found during the examination.  If the procedure report does not answer your questions, please call your gastroenterologist to clarify.  If you requested that your care partner not be given the details of your procedure findings, then the procedure report has been included in a sealed envelope for you to review at your convenience later.  YOU SHOULD EXPECT: Some feelings of bloating in the abdomen. Passage of more gas than usual.  Walking can help get rid of the air that was put into your GI tract during the procedure and reduce the bloating. If you had a lower endoscopy (such as a colonoscopy or flexible sigmoidoscopy) you may notice spotting of blood in your stool or on the toilet paper. If you underwent a bowel prep for your procedure, you may not have a normal bowel movement for a few days.  Please Note:  You might notice some irritation and congestion in your nose or some drainage.  This is from the oxygen  used during your procedure.  There is no need for concern and it should clear up in a day or so.  SYMPTOMS TO REPORT IMMEDIATELY:  Following lower endoscopy (colonoscopy or flexible sigmoidoscopy):  Excessive amounts of blood in the stool  Significant tenderness or worsening of abdominal pains  Swelling of the abdomen that is new, acute  Fever of 100F or higher  For urgent or emergent issues, a gastroenterologist can be reached at any hour by calling [phone]. Do not use MyChart messaging for urgent concerns.    DIET:  We do recommend a small meal at first, but then you may proceed to your regular diet.  Drink plenty of fluids but you should avoid alcoholic beverages for 24 hours.   Resume previous diet.  MEDICATIONS: Continue present medications  Awaiting pathology results  Handouts provided on polyps and  diverticulosis   Repeat colonoscopy in 3 years for surveillance.   ACTIVITY:  You should plan to take it easy for the rest of today and you should NOT DRIVE or use heavy machinery until tomorrow (because of the sedation medicines used during the test).    FOLLOW UP: Our staff will call the number listed on your records the next business day following your procedure.  We will call around 7:15- 8:00 am to check on you and address any questions or concerns that you may have regarding the information given to you following your procedure. If we do not reach you, we will leave a message.     If any biopsies were taken you will be contacted by phone or by letter within the next 1-3 weeks.  Please call us  at [phone] if you have not heard about the biopsies in 3 weeks.    SIGNATURES/CONFIDENTIALITY: You and/or your care partner have signed paperwork which will be entered into your electronic medical record.  These signatures attest to the fact that that the information above on your After Visit Summary has been reviewed and is understood.  Full responsibility of the confidentiality of this discharge information lies with you and/or your care-partner.

## 2024-02-17 NOTE — Op Note (Signed)
 [facility] Patient Name: Leah Lawson Procedure Date: 02/17/2024 9:59 AM MRN: [id number] Endoscopist: Glendia E. Stacia , MD, [id number] Age: 68 Referring MD:  Date of Birth: [birth year] Gender: Female Account #: [id number] Procedure:                Colonoscopy Indications:              Surveillance: History of numerous (> 10) adenomas                            on last colonoscopy (< 3 yrs), Last colonoscopy:                            June 2024 Medicines:                Monitored Anesthesia Care Procedure:                Pre-Anesthesia Assessment:                           - Prior to the procedure, a History and Physical                            was performed, and patient medications and                            allergies were reviewed. The patient's tolerance of                            previous anesthesia was also reviewed. The risks                            and benefits of the procedure and the sedation                            options and risks were discussed with the patient.                            All questions were answered, and informed consent                            was obtained. Prior Anticoagulants: The patient has                            taken no anticoagulant or antiplatelet agents. ASA                            Grade Assessment: III - A patient with severe                            systemic disease. After reviewing the risks and                            benefits, the patient was deemed in satisfactory  condition to undergo the procedure.                           After obtaining informed consent, the colonoscope                            was passed under direct vision. Throughout the                            procedure, the patient's blood pressure, pulse, and                            oxygen  saturations were monitored continuously. The                            Olympus Scope SN [id number] was introduced  through the                            anus and advanced to the the cecum, identified by                            appendiceal orifice and ileocecal valve. The                            colonoscopy was somewhat difficult due to                            significant looping. Successful completion of the                            procedure was aided by using manual pressure. The                            patient tolerated the procedure well. The quality                            of the bowel preparation was good. The ileocecal                            valve, appendiceal orifice, and rectum were                            photographed. The bowel preparation used was SUPREP                            via split dose instruction. Scope In: 10:18:21 AM Scope Out: 10:44:31 AM Scope Withdrawal Time: 0 hours 18 minutes 33 seconds  Total Procedure Duration: 0 hours 26 minutes 10 seconds  Findings:                 The perianal and digital rectal examinations were                            normal. Pertinent negatives include normal  sphincter tone and no palpable rectal lesions.                           Three sessile polyps were found in the transverse                            colon. The polyps were 2 to 4 mm in size. These                            polyps were removed with a cold snare. Resection                            and retrieval were complete. Estimated blood loss                            was minimal.                           A 5 mm polyp was found in the descending colon. The                            polyp was sessile. The polyp was removed with a                            cold snare. Resection and retrieval were complete.                            Estimated blood loss was minimal.                           A 3 mm polyp was found in the distal rectum. The                            polyp was sessile. The polyp was removed with a                             cold snare. Resection and retrieval were complete.                            Estimated blood loss was minimal.                           Multiple medium-mouthed and small-mouthed                            diverticula were found in the sigmoid colon and                            descending colon.                           The exam was otherwise normal throughout the  examined colon.                           The retroflexed view of the distal rectum and anal                            verge was normal and showed no anal or rectal                            abnormalities. Complications:            No immediate complications. Estimated Blood Loss:     Estimated blood loss was minimal. Impression:               - Three 2 to 4 mm polyps in the transverse colon,                            removed with a cold snare. Resected and retrieved.                           - One 5 mm polyp in the descending colon, removed                            with a cold snare. Resected and retrieved.                           - One 3 mm polyp in the distal rectum, removed with                            a cold snare. Resected and retrieved.                           - Mild diverticulosis in the sigmoid colon and in                            the descending colon.                           - The distal rectum and anal verge are normal on                            retroflexion view. Recommendation:           - Patient has a contact number available for                            emergencies. The signs and symptoms of potential                            delayed complications were discussed with the                            patient. Return to normal activities tomorrow.  Written discharge instructions were provided to the                            patient.                           - Resume previous diet.                           - Continue  present medications.                           - Await pathology results.                           - Repeat colonoscopy in 3 years for surveillance. Shayn Madole E. Stacia, MD 02/17/2024 10:50:47 AM This report has been signed electronically.

## 2024-02-17 NOTE — Progress Notes (Signed)
 Called to room to assist during endoscopic procedure.  Patient ID and intended procedure confirmed with present staff. Received instructions for my participation in the procedure from the performing physician.

## 2024-02-17 NOTE — Telephone Encounter (Signed)
 Forwarding to Dr. Denyse Amass to review and advise.

## 2024-02-17 NOTE — Progress Notes (Signed)
 [facility] History and Physical   Primary Care Physician:  Lendia Boby CROME, NP-C   Reason for Procedure:   History of polyps  Plan:    Surveillance colonoscopy     HPI: Leah Lawson is a 68 y.o. female undergoing surveillance colonoscopy.  Her father was diagnosed with colon cancer in his 12s.  She had her initial screening colonoscopy in June 2024 and 14 polyps removed, 13 of which were adenomas.   Past Medical History:  Diagnosis Date   Allergy    Arthritis    Asthma    Back pain    Cataract    Chronic kidney disease    Closed fracture of distal end of left radius 11/15/2022   Constipation    COPD (chronic obstructive pulmonary disease) (HCC)    COPD (chronic obstructive pulmonary disease) (HCC)    Diabetes (HCC)    Diabetes mellitus without complication (HCC)    pre-diabetic   Emphysema of lung (HCC)    Glaucoma    Heartburn    High cholesterol    Hypertension    Hypertensive retinopathy 10/26/2021   Lactose intolerance    Open-angle glaucoma 10/26/2021   Other specified disorders of thyroid     Sleep apnea    SOB (shortness of breath)    Vitamin D  deficiency     Past Surgical History:  Procedure Laterality Date   BREAST SURGERY     CARPAL TUNNEL RELEASE Left 12/2023   COLONOSCOPY     WRIST SURGERY Left 11/18/2022    Prior to Admission medications   Medication Sig Start Date End Date Taking? Authorizing Provider  albuterol  (VENTOLIN  HFA) 108 (90 Base) MCG/ACT inhaler Inhale 1-2 puffs into the lungs every 6 (six) hours as needed for wheezing or shortness of breath. 01/30/24   Jude Harden GAILS, MD  atorvastatin  (LIPITOR) 10 MG tablet 1 TABLET MONDAY, WEDNESDAY, AND FRIDAY ONLY 12/04/23   Raford Riggs, MD  COD LIVER OIL PO Take by mouth.    [provider]  dapagliflozin  propanediol (FARXIGA ) 10 MG TABS tablet Take 1 tablet by mouth once daily 12/10/23   Lendia, Vickie L, NP-C  losartan -hydrochlorothiazide  (HYZAAR) 100-25 MG tablet  Take 1 tablet by mouth once daily 06/17/23   Raford Riggs, MD  meloxicam  (MOBIC ) 15 MG tablet Take 1 tablet by mouth once daily 01/30/24   Henson, Vickie L, NP-C  omeprazole  (PRILOSEC) 20 MG capsule Take 1 capsule by mouth once daily 10/07/23   Dyke Weible E, MD  oxyCODONE-acetaminophen  (PERCOCET) 10-325 MG tablet Take 1 tablet by mouth as needed for pain. 11/28/22   [provider]  spironolactone  (ALDACTONE ) 25 MG tablet TAKE 1 TABLET BY MOUTH ONCE DAILY . APPOINTMENT REQUIRED FOR FUTURE REFILLS 12/30/23   Raford Riggs, MD  tirzepatide  (MOUNJARO ) 12.5 MG/0.5ML Pen Inject 12.5 mg into the skin once a week. 02/16/24   Rayburn, Elouise Phlegm, PA-C  umeclidinium-vilanterol (ANORO ELLIPTA ) 62.5-25 MCG/ACT AEPB Inhale 1 puff by mouth once daily 10/23/23   Alva, Rakesh V, MD  Vitamin D , Ergocalciferol , (DRISDOL ) 1.25 MG (50000 UNIT) CAPS capsule Take 1 capsule (50,000 Units total) by mouth every 7 (seven) days. 02/16/24   Rayburn, Elouise Phlegm, PA-C    Current Outpatient Medications  Medication Sig Dispense Refill   albuterol  (VENTOLIN  HFA) 108 (90 Base) MCG/ACT inhaler Inhale 1-2 puffs into the lungs every 6 (six) hours as needed for wheezing or shortness of breath. 9 g 3   atorvastatin  (LIPITOR) 10 MG tablet 1 TABLET MONDAY, WEDNESDAY, AND  FRIDAY ONLY 90 tablet 3   COD LIVER OIL PO Take by mouth.     dapagliflozin  propanediol (FARXIGA ) 10 MG TABS tablet Take 1 tablet by mouth once daily 90 tablet 1   losartan -hydrochlorothiazide  (HYZAAR) 100-25 MG tablet Take 1 tablet by mouth once daily 90 tablet 3   meloxicam  (MOBIC ) 15 MG tablet Take 1 tablet by mouth once daily 30 tablet 1   omeprazole  (PRILOSEC) 20 MG capsule Take 1 capsule by mouth once daily 90 capsule 0   oxyCODONE-acetaminophen  (PERCOCET) 10-325 MG tablet Take 1 tablet by mouth as needed for pain.     spironolactone  (ALDACTONE ) 25 MG tablet TAKE 1 TABLET BY MOUTH ONCE DAILY . APPOINTMENT REQUIRED FOR FUTURE REFILLS  90 tablet 3   tirzepatide  (MOUNJARO ) 12.5 MG/0.5ML Pen Inject 12.5 mg into the skin once a week. 6 mL 0   umeclidinium-vilanterol (ANORO ELLIPTA ) 62.5-25 MCG/ACT AEPB Inhale 1 puff by mouth once daily 60 each 11   Vitamin D , Ergocalciferol , (DRISDOL ) 1.25 MG (50000 UNIT) CAPS capsule Take 1 capsule (50,000 Units total) by mouth every 7 (seven) days. 4 capsule 0   Current Facility-Administered Medications  Medication Dose Route Frequency Provider Last Rate Last Admin   0.9 %  sodium chloride  infusion  500 mL Intravenous Continuous Stacia Glendia BRAVO, MD        Allergies as of 02/17/2024 - Review Complete 02/17/2024  Allergen Reaction Noted   Hydralazine  Swelling 02/11/2022   Rosuvastatin  Other (See Comments) 12/04/2023   Singulair [montelukast sodium] Other (See Comments) 05/08/2018   Lisinopril Cough 10/26/2021   Mucinex [guaifenesin er] Diarrhea and Rash 05/08/2018    Family History  Problem Relation Age of Onset   Hypertension Mother    Heart failure Mother    Diabetes Mother    High blood pressure Mother    Hypertension Father    Colon polyps Father    Colon cancer Father    Cancer Father        prostate, lung   COPD Father    High Cholesterol Father    High blood pressure Father    Dementia Father    Lung cancer Father    Diabetes Brother    High blood pressure Brother    Stroke Brother    Colon cancer Maternal Grandmother    Cancer Maternal Grandfather    Miscarriages / Stillbirths Daughter    Breast cancer Cousin    Esophageal cancer Neg Hx    Liver disease Neg Hx    Rectal cancer Neg Hx    Stomach cancer Neg Hx     Social History   Socioeconomic History   Marital status: Single    Spouse name: Not on file   Number of children: 4   Years of education: Not on file   Highest education level: Master's degree (e.g., MA, MS, MEng, MEd, MSW, MBA)  Occupational History   Occupation: retired  Tobacco Use   Smoking status: Former    Current packs/day: 0.00     Average packs/day: 0.8 packs/day for 46.0 years (34.5 ttl pk-yrs)    Types: Cigarettes    Start date: 05/05/1972    Quit date: 05/05/2018    Years since quitting: 5.7    Passive exposure: Past   Smokeless tobacco: Never  Vaping Use   Vaping status: Never Used  Substance and Sexual Activity   Alcohol use: Not Currently   Drug use: Never   Sexual activity: Not on file  Other Topics Concern  Not on file  Social History Narrative   Not on file   Social Drivers of Health   Financial Resource Strain: Low Risk  (10/10/2023)   Overall Financial Resource Strain (CARDIA)    Difficulty of Paying Living Expenses: Not very hard  Food Insecurity: No Food Insecurity (10/10/2023)   Hunger Vital Sign    Worried About Running Out of Food in the Last Year: Never true    Ran Out of Food in the Last Year: Never true  Transportation Needs: No Transportation Needs (10/10/2023)   PRAPARE - Administrator, Civil Service (Medical): No    Lack of Transportation (Non-Medical): No  Physical Activity: Insufficiently Active (10/10/2023)   Exercise Vital Sign    Days of Exercise per Week: 3 days    Minutes of Exercise per Session: 20 min  Stress: No Stress Concern Present (10/10/2023)   [employer] of Occupational Health - Occupational Stress Questionnaire    Feeling of Stress : Not at all  Social Connections: Moderately Integrated (10/10/2023)   Social Connection and Isolation Panel    Frequency of Communication with Friends and Family: Three times a week    Frequency of Social Gatherings with Friends and Family: Once a week    Attends Religious Services: More than 4 times per year    Active Member of [employer] or Organizations: Yes    Attends Engineer, structural: More than 4 times per year    Marital Status: Never married  Intimate Partner Violence: Not At Risk (07/11/2023)   Humiliation, Afraid, Rape, and Kick questionnaire    Fear of Current or Ex-Partner: No    Emotionally Abused: No     Physically Abused: No    Sexually Abused: No    Review of Systems:  All other review of systems negative except as mentioned in the HPI.  Physical Exam: Vital signs BP (!) 157/83   Pulse 67   Temp (!) 97.3 F (36.3 C) (Skin)   Ht 5' 2 (1.575 m)   Wt 223 lb (101.2 kg)   SpO2 92%   BMI 40.79 kg/m   General:   Alert,  Well-developed, well-nourished, pleasant and cooperative in NAD Airway:  Mallampati 1 Lungs:  Clear throughout to auscultation.   Heart:  Regular rate and rhythm; no murmurs, clicks, rubs,  or gallops. Abdomen:  Soft, nontender and nondistended. Normal bowel sounds.   Neuro/Psych:  Normal mood and affect. A and O x 3   Depaul Arizpe E. Stacia, MD [facility]

## 2024-02-17 NOTE — Progress Notes (Signed)
 A/O x 3, gd SR's, VSS, report to RN

## 2024-02-17 NOTE — Progress Notes (Signed)
 Abnormal rhythm noted on recovery. Vital signs stable. Pt denies symptoms. Md and CRNA notified. EKG performed and showed sinus rhythm. MD and CRNA reviewed. No new orders. Pt continues to be asymptomatic denying pain or discomfort. Will discharge home.

## 2024-02-18 ENCOUNTER — Telehealth: Payer: Self-pay | Admitting: *Deleted

## 2024-02-18 NOTE — Telephone Encounter (Signed)
  Follow up Call-     02/17/2024    9:55 AM 01/28/2023   10:06 AM  Call back number  Post procedure Call Back phone  # [phone]  Permission to leave phone message Yes Yes     Patient questions:  Do you have a fever, pain , or abdominal swelling? No. Pain Score  0 *  Have you tolerated food without any problems? Yes.    Have you been able to return to your normal activities? Yes.    Do you have any questions about your discharge instructions: Diet   No. Medications  No. Follow up visit  No.  Do you have questions or concerns about your Care? No.  Actions: * If pain score is 4 or above: No action needed, pain <4.

## 2024-02-18 NOTE — Telephone Encounter (Signed)
 You are scheduled to see me on the 21st for to think is a good idea.  You are scheduled to see physical therapy on the 24th which is a good idea.  We can certainly look at this knee with a more detailed test if needed at that visit.

## 2024-02-19 LAB — SURGICAL PATHOLOGY

## 2024-02-23 ENCOUNTER — Ambulatory Visit (INDEPENDENT_AMBULATORY_CARE_PROVIDER_SITE_OTHER): Admitting: Family Medicine

## 2024-02-23 ENCOUNTER — Other Ambulatory Visit: Payer: Self-pay

## 2024-02-23 ENCOUNTER — Ambulatory Visit (INDEPENDENT_AMBULATORY_CARE_PROVIDER_SITE_OTHER)

## 2024-02-23 ENCOUNTER — Ambulatory Visit: Payer: Self-pay | Admitting: Gastroenterology

## 2024-02-23 VITALS — BP 104/74 | HR 85 | Ht 62.0 in | Wt 225.0 lb

## 2024-02-23 DIAGNOSIS — M25561 Pain in right knee: Secondary | ICD-10-CM

## 2024-02-23 DIAGNOSIS — M5416 Radiculopathy, lumbar region: Secondary | ICD-10-CM

## 2024-02-23 DIAGNOSIS — G8929 Other chronic pain: Secondary | ICD-10-CM

## 2024-02-23 MED ORDER — PREDNISONE 50 MG PO TABS: ORAL_TABLET | ORAL | 0 refills | Status: DC

## 2024-02-23 MED ORDER — GABAPENTIN 100 MG PO CAPS: 100.0000 mg | ORAL_CAPSULE | Freq: Three times a day (TID) | ORAL | 2 refills | Status: AC | PRN

## 2024-02-23 NOTE — Patient Instructions (Addendum)
 Thank you for coming in today.   Please get an Xray today before you leave   I've sent a prescription for Prednisone  & Gabapentin  to your pharmacy.   You should hear from MRI scheduling within 1 week. If you do not hear please let me know.

## 2024-02-23 NOTE — Progress Notes (Signed)
 LILLETTE Ileana Collet, PhD, LAT, ATC acting as a scribe for Artist Lloyd, MD.  Leah Lawson is a 68 y.o. female who presents to [facility] today for cont'd R knee pain. Pt was last seen by Dr. Lloyd on 01/19/24 and was given a R knee steroid injection and was referred to PT, only completing the initial evaluation.   Today, pt reports prior steroid injection only helped her R knee pain for about a week. Pain is now worse. She has been taking the oxycodone at bedtime. Pt locates pain to the posterior aspect of her R knee, proximal calf, and anterior lower leg. She notes increased pain w/ HEP.  She is unsure about her LBP.  Dx testing: 01/10/24 R knee XR  Pertinent review of systems: No fevers or chills  Relevant historical information: Hypertension and emphysema diabetes   Exam:  BP 104/74   Pulse 85   Ht 5' 2 (1.575 m)   Wt 225 lb (102.1 kg)   SpO2 96%   BMI 41.15 kg/m  General: Well Developed, well nourished, and in no acute distress.   MSK: Right knee mild effusion decreased range of motion.  Tender palpation medial joint line. Stable ligamentous exam. Intact strength.  L-spine decreased lumbar motion.  Lower extremity strength is intact. Positive slump test.    Lab and Radiology Results  Diagnostic Limited MSK Ultrasound of: Right knee Posterior knee no Baker's cyst. Impression: No Baker's cyst    X-ray images lumbar spine obtained today personally and independently interpreted. Mild diffuse DDD.  No acute fractures. Await formal radiology review  Assessment and Plan: 68 y.o. female with chronic right knee pain with pain radiating down the rig.  Symptoms are multifactorial.  She has evidence of knee pain from worst DJD that is visible on her x-ray or meniscus tear as well as lumbar radiculopathy likely involving the right L4.  She has not improved with injection and physical therapy.  Her pain is severe rated up to 10 out of 10 at  times and causing significant dysfunction.  Plan for MRI lumbar spine and knee.  Consider epidural steroid injection lumbar spine and potential surgical consultation or different knee injection based on results.  Today I did prescribe prednisone  and gabapentin  to use as needed.  She does have oxycodone prescribed by a different provider that she can use as well.   PDMP reviewed during this encounter. Orders Placed This Encounter  Procedures   US  LIMITED JOINT SPACE STRUCTURES LOW RIGHT(NO LINKED CHARGES)    Reason for Exam (SYMPTOM  OR DIAGNOSIS REQUIRED):   right knee pain    Preferred imaging location?:   [facility] Lumbar Spine 2-3 Views    Standing Status:   Future    Number of Occurrences:   1    Expiration Date:   03/25/2024    Reason for Exam (SYMPTOM  OR DIAGNOSIS REQUIRED):   lumbar radiculitis    Preferred imaging location?:   [facility]   MR Lumbar Spine Wo Contrast    Standing Status:   Future    Expiration Date:   02/22/2025    What is the patient's sedation requirement?:   No Sedation    Does the patient have a pacemaker or implanted devices?:   No    Preferred imaging location?:   [facility] (table limit-550lbs)   MR Knee Right Wo Contrast    Standing Status:   Future  Expiration Date:   02/22/2025    What is the patient's sedation requirement?:   No Sedation    Does the patient have a pacemaker or implanted devices?:   No    Preferred imaging location?:   [facility] (table limit-550lbs)   Meds ordered this encounter  Medications   predniSONE  (DELTASONE ) 50 MG tablet    Sig: Take 1 pill daily for 5 days    Dispense:  5 tablet    Refill:  0   gabapentin  (NEURONTIN ) 100 MG capsule    Sig: Take 1-3 capsules (100-300 mg total) by mouth 3 (three) times daily as needed.    Dispense:  60 capsule    Refill:  2     Discussed warning signs or symptoms. Please see discharge instructions. Patient expresses  understanding.   The above documentation has been reviewed and is accurate and complete Artist Lloyd, M.D.

## 2024-02-23 NOTE — Progress Notes (Signed)
 Leah Lawson,   Three of the five polyps that I removed during your recent procedure were completely benign but were proven to be pre-cancerous polyps that MAY have grown into cancers if they had not been removed.  The other two polyps were not precancerous.  Studies shows that at least 20% of women over age 69 and 30% of men over age 91 have pre-cancerous polyps.  Based on current nationally recognized surveillance guidelines, I recommend that you have a repeat colonoscopy in 3 years.   If you develop any new rectal bleeding, abdominal pain or significant bowel habit changes, please contact me before then.

## 2024-02-26 ENCOUNTER — Ambulatory Visit: Admitting: Physical Therapy

## 2024-03-01 ENCOUNTER — Ambulatory Visit: Payer: Self-pay | Admitting: Family Medicine

## 2024-03-01 DIAGNOSIS — M5416 Radiculopathy, lumbar region: Secondary | ICD-10-CM

## 2024-03-01 NOTE — Progress Notes (Signed)
 Lumbar spine x-ray shows severe arthritis at the base of the spine.

## 2024-03-03 ENCOUNTER — Other Ambulatory Visit: Payer: Medicare Other

## 2024-03-03 ENCOUNTER — Ambulatory Visit: Payer: Medicare Other | Admitting: Physician Assistant

## 2024-03-08 ENCOUNTER — Other Ambulatory Visit: Payer: Self-pay | Admitting: Physician Assistant

## 2024-03-08 DIAGNOSIS — D751 Secondary polycythemia: Secondary | ICD-10-CM

## 2024-03-08 NOTE — Progress Notes (Unsigned)
 [facility] [phone]   [phone]  PROGRESS NOTE  Patient Care Team: Lendia Boby CROME, NP-C as PCP - General (Family Medicine) Abigail Maude POUR as Consulting Physician [facility]) Center, [facility]  Hematological/Oncological History 10/18/2021: WBC 5.9, Hgb 16.1, Hct 48.8%, Plt 237 01/31/2022: WBC 5.0, Hgb 16.3, Hct 49.9%, Plt 239 07/10/2022: WBC 5.6, Hgb 17.7, Hct 52.5%, Plt 286 08/14/2022: WBC 5.8, Hgb 16.5, Hct 49.7%, Plt 260 09/04/2022: Establish care with [facility] Hematology MPN panel was negative for driver mutations BCR/ABL FISH negative for gene rearrangement  CHIEF COMPLAINTS/PURPOSE OF CONSULTATION:  Secondary Polycythemia   HISTORY OF PRESENTING ILLNESS:  Leah Lawson 68 y.o. 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 She is unaccompanied for this visit.   On exam today, Leah Lawson reports she is doing well.  Without any major changes to her health.  She continues to use the CPAP machine without interruption.  She is having some right lower extremity pain that she is undergoing workup.  The pain is felt to be secondary to arthritis and is awaiting MRI imaging.  She denies nausea, vomiting or bowel habit changes.  She denies easy bruising or signs of active bleeding.  She denies fevers, chills, sweats, chest pain or cough. She has no other complaints.   MEDICAL HISTORY:  Past Medical History:  Diagnosis Date   Allergy    Arthritis    Asthma    Back pain    Cataract    Chronic kidney disease    Closed fracture of distal end of left radius 11/15/2022   Constipation    COPD (chronic obstructive pulmonary disease) (HCC)    COPD (chronic obstructive pulmonary disease) (HCC)    Diabetes (HCC)    Diabetes mellitus without complication (HCC)    pre-diabetic   Emphysema of lung (HCC)    Glaucoma    Heartburn    High cholesterol    Hypertension    Hypertensive retinopathy 10/26/2021   Lactose  intolerance    Open-angle glaucoma 10/26/2021   Other specified disorders of thyroid     Sleep apnea    SOB (shortness of breath)    Vitamin D  deficiency     SURGICAL HISTORY: Past Surgical History:  Procedure Laterality Date   BREAST SURGERY     CARPAL TUNNEL RELEASE Left 12/2023   COLONOSCOPY     WRIST SURGERY Left 11/18/2022    SOCIAL HISTORY: Social History   Socioeconomic History   Marital status: Single    Spouse name: Not on file   Number of children: 4   Years of education: Not on file   Highest education level: Master's degree (e.g., MA, MS, MEng, MEd, MSW, MBA)  Occupational History   Occupation: retired  Tobacco Use   Smoking status: Former    Current packs/day: 0.00    Average packs/day: 0.8 packs/day for 46.0 years (34.5 ttl pk-yrs)    Types: Cigarettes    Start date: 05/05/1972    Quit date: 05/05/2018    Years since quitting: 5.8    Passive exposure: Past   Smokeless tobacco: Never  Vaping Use   Vaping status: Never Used  Substance and Sexual Activity   Alcohol use: Not Currently   Drug use: Never   Sexual activity: Not on file  Other Topics Concern   Not on file  Social History Narrative   Not on file   Social Drivers of Health   Financial Resource Strain: Low Risk  (10/10/2023)  Overall Financial Resource Strain (CARDIA)    Difficulty of Paying Living Expenses: Not very hard  Food Insecurity: No Food Insecurity (10/10/2023)   Hunger Vital Sign    Worried About Running Out of Food in the Last Year: Never true    Ran Out of Food in the Last Year: Never true  Transportation Needs: No Transportation Needs (10/10/2023)   PRAPARE - Administrator, Civil Service (Medical): No    Lack of Transportation (Non-Medical): No  Physical Activity: Insufficiently Active (10/10/2023)   Exercise Vital Sign    Days of Exercise per Week: 3 days    Minutes of Exercise per Session: 20 min  Stress: No Stress Concern Present (10/10/2023)   [employer]  of Occupational Health - Occupational Stress Questionnaire    Feeling of Stress : Not at all  Social Connections: Moderately Integrated (10/10/2023)   Social Connection and Isolation Panel    Frequency of Communication with Friends and Family: Three times a week    Frequency of Social Gatherings with Friends and Family: Once a week    Attends Religious Services: More than 4 times per year    Active Member of [employer] or Organizations: Yes    Attends Engineer, structural: More than 4 times per year    Marital Status: Never married  Intimate Partner Violence: Not At Risk (07/11/2023)   Humiliation, Afraid, Rape, and Kick questionnaire    Fear of Current or Ex-Partner: No    Emotionally Abused: No    Physically Abused: No    Sexually Abused: No    FAMILY HISTORY: Family History  Problem Relation Age of Onset   Hypertension Mother    Heart failure Mother    Diabetes Mother    High blood pressure Mother    Hypertension Father    Colon polyps Father    Colon cancer Father    Cancer Father        prostate, lung   COPD Father    High Cholesterol Father    High blood pressure Father    Dementia Father    Lung cancer Father    Diabetes Brother    High blood pressure Brother    Stroke Brother    Colon cancer Maternal Grandmother    Cancer Maternal Grandfather    Miscarriages / Stillbirths Daughter    Breast cancer Cousin    Esophageal cancer Neg Hx    Liver disease Neg Hx    Rectal cancer Neg Hx    Stomach cancer Neg Hx     ALLERGIES:  is allergic to hydralazine , rosuvastatin , singulair [montelukast sodium], lisinopril, and mucinex [guaifenesin er].  MEDICATIONS:  Current Outpatient Medications  Medication Sig Dispense Refill   albuterol  (VENTOLIN  HFA) 108 (90 Base) MCG/ACT inhaler Inhale 1-2 puffs into the lungs every 6 (six) hours as needed for wheezing or shortness of breath. 9 g 3   atorvastatin  (LIPITOR) 10 MG tablet 1 TABLET MONDAY, WEDNESDAY, AND FRIDAY ONLY 90  tablet 3   COD LIVER OIL PO Take by mouth.     dapagliflozin  propanediol (FARXIGA ) 10 MG TABS tablet Take 1 tablet by mouth once daily 90 tablet 1   gabapentin  (NEURONTIN ) 100 MG capsule Take 1-3 capsules (100-300 mg total) by mouth 3 (three) times daily as needed. 60 capsule 2   losartan -hydrochlorothiazide  (HYZAAR) 100-25 MG tablet Take 1 tablet by mouth once daily 90 tablet 3   meloxicam  (MOBIC ) 15 MG tablet Take 1 tablet by mouth once daily  30 tablet 1   omeprazole  (PRILOSEC) 20 MG capsule Take 1 capsule by mouth once daily 90 capsule 0   oxyCODONE-acetaminophen  (PERCOCET) 10-325 MG tablet Take 1 tablet by mouth as needed for pain.     spironolactone  (ALDACTONE ) 25 MG tablet TAKE 1 TABLET BY MOUTH ONCE DAILY . APPOINTMENT REQUIRED FOR FUTURE REFILLS 90 tablet 3   tirzepatide  (MOUNJARO ) 12.5 MG/0.5ML Pen Inject 12.5 mg into the skin once a week. 6 mL 0   umeclidinium-vilanterol (ANORO ELLIPTA ) 62.5-25 MCG/ACT AEPB Inhale 1 puff by mouth once daily 60 each 11   Vitamin D , Ergocalciferol , (DRISDOL ) 1.25 MG (50000 UNIT) CAPS capsule Take 1 capsule (50,000 Units total) by mouth every 7 (seven) days. 4 capsule 0   No current facility-administered medications for this visit.    REVIEW OF SYSTEMS:   Constitutional: ( - ) fevers, ( - )  chills , ( - ) night sweats Eyes: ( - ) blurriness of vision, ( - ) double vision, ( - ) watery eyes Ears, nose, mouth, throat, and face: ( - ) mucositis, ( - ) sore throat Respiratory: ( - ) cough, ( - ) dyspnea, ( - ) wheezes Cardiovascular: ( - ) palpitation, ( - ) chest discomfort, ( - ) lower extremity swelling Gastrointestinal:  ( - ) nausea, ( - ) heartburn, ( - ) change in bowel habits Skin: ( - ) abnormal skin rashes Lymphatics: ( - ) new lymphadenopathy, ( - ) easy bruising Neurological: ( - ) numbness, ( - ) tingling, ( - ) new weaknesses Behavioral/Psych: ( - ) mood change, ( - ) new changes  All other systems were reviewed with the patient and are  negative.  PHYSICAL EXAMINATION: ECOG PERFORMANCE STATUS: 0 - Asymptomatic  Vitals:   03/10/24 1057  BP: 107/73  Pulse: 92  Resp: 18  Temp: 97.7 F (36.5 C)  SpO2: 97%    Filed Weights   03/10/24 1057  Weight: 223 lb 14.4 oz (101.6 kg)     GENERAL: well appearing female in NAD  SKIN: skin color, texture, turgor are normal, no rashes or significant lesions EYES: conjunctiva are pink and non-injected, sclera clear LUNGS: clear to auscultation and percussion with normal breathing effort HEART: regular rate & rhythm and no murmurs and no lower extremity edema Musculoskeletal: no cyanosis of digits and no clubbing  PSYCH: alert & oriented x 3, fluent speech NEURO: no focal motor/sensory deficits  LABORATORY DATA:  I have reviewed the data as listed    Latest Ref Rng & Units 03/10/2024   10:45 AM 10/17/2023   12:04 PM 03/04/2023   10:14 AM  CBC  WBC 4.0 - 10.5 K/uL 6.2  5.8  6.4   Hemoglobin 12.0 - 15.0 g/dL 84.4  83.7  84.3   Hematocrit 36.0 - 46.0 % 44.6  48.7  45.0   Platelets 150 - 400 K/uL 210  256.0  286        Latest Ref Rng & Units 03/10/2024   10:45 AM 02/12/2024   10:35 AM 10/17/2023   12:04 PM  CMP  Glucose 70 - 99 mg/dL 881  97  82   BUN 8 - 23 mg/dL 33  28  25   Creatinine 0.44 - 1.00 mg/dL 9.09  9.10  9.16   Sodium 135 - 145 mmol/L 138  143  140   Potassium 3.5 - 5.1 mmol/L 4.1  4.9  4.2   Chloride 98 - 111 mmol/L 102  102  103   CO2 22 - 32 mmol/L 29  23  27    Calcium  8.9 - 10.3 mg/dL 9.4  9.7  9.7   Total Protein 6.5 - 8.1 g/dL 6.6  6.9  7.5   Total Bilirubin 0.0 - 1.2 mg/dL 0.6  0.4  0.6   Alkaline Phos 38 - 126 U/L 73  92  63   AST 15 - 41 U/L 11  14  19    ALT 0 - 44 U/L 12  15  17       ASSESSMENT & PLAN Denis Nevers is a 68 y.o. 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   #Secondary Polycythemia: --workup from 09/04/2022 showed no evidence of driver mutations and no evidence of BCR/ABL gene rearrangement.  --patient is a  non smoker and does not use any testosterone containing products --patient was diagnosed with OSA and is currently on CPAP machine and 2 L of supplemental oxygen  at night.This is the likely cause of patient's polycythemia.  --Labs today show improving hemoglobin level measuring 15.5 g/dL. No other blood cell abnormalities.  --Continue to monitor counts as patient continues on CPAP/supplemental oxygen  for OSA. --Okay to follow up with PCP  moving forward and return to hematology clinic Hgb is 18 g/dL or higher   No orders of the defined types were placed in this encounter.   All questions were answered. The patient knows to call the clinic with any problems, questions or concerns.  I have spent a total of 25 minutes minutes of face-to-face and non-face-to-face time, preparing to see the patient, performing a medically appropriate examination, counseling and educating the patient,documenting clinical information in the electronic health record, and care coordination.   Johnston Police, PA-C Department of Hematology/Oncology [facility] Phone: [phone]

## 2024-03-10 ENCOUNTER — Inpatient Hospital Stay (HOSPITAL_BASED_OUTPATIENT_CLINIC_OR_DEPARTMENT_OTHER): Payer: Self-pay | Admitting: Physician Assistant

## 2024-03-10 ENCOUNTER — Inpatient Hospital Stay: Payer: Self-pay | Attending: Physician Assistant

## 2024-03-10 VITALS — BP 107/73 | HR 92 | Temp 97.7°F | Resp 18 | Ht 62.0 in | Wt 223.9 lb

## 2024-03-10 DIAGNOSIS — Z803 Family history of malignant neoplasm of breast: Secondary | ICD-10-CM | POA: Diagnosis not present

## 2024-03-10 DIAGNOSIS — G4733 Obstructive sleep apnea (adult) (pediatric): Secondary | ICD-10-CM | POA: Diagnosis not present

## 2024-03-10 DIAGNOSIS — Z8042 Family history of malignant neoplasm of prostate: Secondary | ICD-10-CM | POA: Diagnosis not present

## 2024-03-10 DIAGNOSIS — Z800 Family history of malignant neoplasm of digestive organs: Secondary | ICD-10-CM | POA: Insufficient documentation

## 2024-03-10 DIAGNOSIS — Z87891 Personal history of nicotine dependence: Secondary | ICD-10-CM | POA: Diagnosis not present

## 2024-03-10 DIAGNOSIS — Z801 Family history of malignant neoplasm of trachea, bronchus and lung: Secondary | ICD-10-CM | POA: Insufficient documentation

## 2024-03-10 DIAGNOSIS — D751 Secondary polycythemia: Secondary | ICD-10-CM

## 2024-03-10 LAB — CBC WITH DIFFERENTIAL (CANCER CENTER ONLY)
Abs Immature Granulocytes: 0.14 K/uL — ABNORMAL HIGH (ref 0.00–0.07)
Basophils Absolute: 0 K/uL (ref 0.0–0.1)
Basophils Relative: 1 %
Eosinophils Absolute: 0.1 K/uL (ref 0.0–0.5)
Eosinophils Relative: 2 %
HCT: 44.6 % (ref 36.0–46.0)
Hemoglobin: 15.5 g/dL — ABNORMAL HIGH (ref 12.0–15.0)
Immature Granulocytes: 2 %
Lymphocytes Relative: 18 %
Lymphs Abs: 1.1 K/uL (ref 0.7–4.0)
MCH: 31 pg (ref 26.0–34.0)
MCHC: 34.8 g/dL (ref 30.0–36.0)
MCV: 89.2 fL (ref 80.0–100.0)
Monocytes Absolute: 0.4 K/uL (ref 0.1–1.0)
Monocytes Relative: 7 %
Neutro Abs: 4.4 K/uL (ref 1.7–7.7)
Neutrophils Relative %: 70 %
Platelet Count: 210 K/uL (ref 150–400)
RBC: 5 MIL/uL (ref 3.87–5.11)
RDW: 14.1 % (ref 11.5–15.5)
WBC Count: 6.2 K/uL (ref 4.0–10.5)
nRBC: 0 % (ref 0.0–0.2)

## 2024-03-10 LAB — CMP (CANCER CENTER ONLY)
ALT: 12 U/L (ref 0–44)
AST: 11 U/L — ABNORMAL LOW (ref 15–41)
Albumin: 4.1 g/dL (ref 3.5–5.0)
Alkaline Phosphatase: 73 U/L (ref 38–126)
Anion gap: 7 (ref 5–15)
BUN: 33 mg/dL — ABNORMAL HIGH (ref 8–23)
CO2: 29 mmol/L (ref 22–32)
Calcium: 9.4 mg/dL (ref 8.9–10.3)
Chloride: 102 mmol/L (ref 98–111)
Creatinine: 0.9 mg/dL (ref 0.44–1.00)
GFR, Estimated: >60 mL/min (ref >60)
Glucose, Bld: 118 mg/dL — ABNORMAL HIGH (ref 70–99)
Potassium: 4.1 mmol/L (ref 3.5–5.1)
Sodium: 138 mmol/L (ref 135–145)
Total Bilirubin: 0.6 mg/dL (ref 0.0–1.2)
Total Protein: 6.6 g/dL (ref 6.5–8.1)

## 2024-03-11 ENCOUNTER — Other Ambulatory Visit

## 2024-03-11 ENCOUNTER — Telehealth: Payer: Self-pay | Admitting: Family Medicine

## 2024-03-11 NOTE — Telephone Encounter (Signed)
 Patient called stating that [facility] called her to cancel her appointment for her MRI stating that they do not accept her health insurance ([employer]).   She asked if this could be sent to another location. Possibly [facility]? She said that she would prefer a [facility] location.

## 2024-03-11 NOTE — Addendum Note (Signed)
 Addended by: GEROME CROME R on: 03/11/2024 12:22 PM   Modules accepted: Orders

## 2024-03-11 NOTE — Telephone Encounter (Signed)
 Patient called and stated [facility] said they would accept her insurance.

## 2024-03-13 ENCOUNTER — Other Ambulatory Visit (INDEPENDENT_AMBULATORY_CARE_PROVIDER_SITE_OTHER): Payer: Self-pay | Admitting: Physician Assistant

## 2024-03-13 DIAGNOSIS — E559 Vitamin D deficiency, unspecified: Secondary | ICD-10-CM

## 2024-03-16 NOTE — Progress Notes (Unsigned)
 SUBJECTIVE: Discussed the use of AI scribe software for clinical note transcription with the patient, who gave verbal consent to proceed.  Chief Complaint: Obesity  Interim History: She is down 2 lbs since last visit Down 23 lbs overall TBW loss of 9.5%  Leah Lawson is here to discuss her progress with her obesity treatment plan. She is on the Category 2 Plan and states she is following her eating plan approximately 90-100 % of the time. She states she is exercising 20 minutes 3-4 times per week. Leah Lawson is a 68 year old female with obesity, type 2 diabetes, and obstructive sleep apnea who presents for follow-up on her obesity treatment plan.  She is adhering to a category two nutrition plan 90-100% of the time, resulting in a total weight loss of 23 pounds, which is 9.5% of her total body weight. Despite right leg problems limiting her exercise, she manages to exercise for 20 minutes three to four times per week.  She experiences severe right leg and back pain, which is exacerbated by sitting and alleviated by walking. An MRI was initially delayed due to insurance issues but is now scheduled for Friday.  Her type 2 diabetes is managed with Mounjaro , which she finds effective for hunger control, although she experiences increased hunger post-meal. Her last A1c was 6.2 in March, and her recent glucose level was 118.  She has obstructive sleep apnea and uses a CPAP machine. In the past, not using the CPAP led to increased red blood cell counts due to nocturnal hypoxia. She is a former smoker, which may have contributed to elevated hemoglobin levels.  She has hyperlipidemia and takes a cholesterol medication at night. For pain management, she takes meloxicam  for back pain and gabapentin  for leg pain, though she is uncertain of its efficacy. She notices bruising at injection sites, possibly related to weight loss and thinner skin.  A colonoscopy on July 15 revealed polyps and diverticular  disease, with polyps removed. She is scheduled for a repeat colonoscopy in three years.  She has a history of vitamin D  deficiency and is on vitamin D2 supplementation. Her last vitamin D  level in March was 21, indicating it remains low. She does not currently take B12 supplements but has in the past when she was a smoker.  She has a family history of colorectal cancer, with her father and possibly grandmother having had the disease, raising concerns about the recurrence of polyps despite previous removal.  OBJECTIVE: Visit Diagnoses: Problem List Items Addressed This Visit     Morbid obesity (HCC) with starting BMI 44   Vitamin D  deficiency   Hyperlipidemia associated with type 2 diabetes mellitus (HCC)   Type 2 diabetes mellitus with obesity (HCC) - Primary   OSA on CPAP   Drug-induced constipation   Other Visit Diagnoses       Hypertension associated with diabetes (HCC)         Chronic right leg and back pain Ongoing right leg and back pain, exacerbated by sitting. MRI rescheduled due to insurance issues, now set for Friday. Current treatment with meloxicam  and gabapentin  shows uncertain efficacy. Potential steroid injection discussed pending MRI results. - Complete MRI of back and leg - Continue meloxicam  and gabapentin  as prescribed - Evaluate MRI results for further management options  Obesity Continued weight loss of 23 pounds, 9.5% of total body weight. Adhering to category two nutrition plan 90-100% of the time. Exercise limited by leg pain but maintaining 20 minutes 3-4 times  per week. Slow, steady weight loss beneficial for long-term maintenance and reduces colorectal cancer risk. - Continue category two nutrition plan - Encourage continued exercise as tolerated - Monitor weight and adjust plan as needed  Type 2 diabetes mellitus Glucose level at 118 mg/dL. Last A1c was 6.2% in March, indicating good control. No recent A1c test performed. Mounjaro  12.5 mg weekly Denies  mass in neck, dysphagia, dyspepsia, persistent hoarseness, abdominal pain, or N/V/Constipation or diarrhea. Has annual eye exam. Mood is stable.   - Plan for A1c testing during upcoming visit with Dr. Lendia in September She is working  on nutrition plan to decrease simple carbohydrates, increase lean proteins and exercise to promote weight loss and improve glycemic control . Does not need Mounjaro  refill this visit'   Obstructive sleep apnea on CPAP Continued CPAP use. High hemoglobin potentially related to previous smoking history and CPAP use. No bone marrow issues identified. High hemoglobin may be due to low oxygen  levels at night if CPAP is not used. - Continue CPAP use as prescribed  Vitamin D  deficiency Vitamin D  level was 21 ng/mL in March, indicating deficiency. Currently on Ergocalciferol  50,000 units once weekly supplementation. - Refill Ergocalciferol  50,000 units once weekly -Low vitamin D  levels can be associated with adiposity and may result in leptin resistance and weight gain. Also associated with fatigue.  Currently on vitamin D  supplementation without any adverse effects such as nausea, vomiting or muscle weakness.  Recheck vitamin D  level if not done with PCP.  Meds ordered this encounter  Medications   Vitamin D , Ergocalciferol , (DRISDOL ) 1.25 MG (50000 UNIT) CAPS capsule    Sig: Take 1 capsule (50,000 Units total) by mouth every 7 (seven) days.    Dispense:  4 capsule    Refill:  0    Colonic polyps, status post removal Recent colonoscopy on July 15 showed precancerous polyps and diverticular disease. Polyps removed with no evidence of cancer. Repeat colonoscopy recommended in three years. - Repeat colonoscopy in three years - Maintain high fiber diet to manage diverticular disease  Diverticular disease of colon Presence of diverticular disease noted during recent colonoscopy. No current complications as long as bowel movements are regular. - Maintain high fiber  diet  Vitals Temp: 97.9 F (36.6 C) BP: 109/75 Pulse Rate: 71 SpO2: 98 %   Anthropometric Measurements Height: 5' 2 (1.575 m) Weight: 218 lb (98.9 kg) BMI (Calculated): 39.86 Weight at Last Visit: 220 lb Weight Lost Since Last Visit: 2 lb Weight Gained Since Last Visit: 0 Starting Weight: 241 lb Total Weight Loss (lbs): 23 lb (10.4 kg) Peak Weight: 250 lb   Body Composition  Body Fat %: 47.6 % Fat Mass (lbs): 104 lbs Muscle Mass (lbs): 108.6 lbs Total Body Water (lbs): 77.4 lbs Visceral Fat Rating : 16   Other Clinical Data Fasting: no Labs: no Today's Visit #: 19 Starting Date: 11/27/22     ASSESSMENT AND PLAN:  Diet: Editha is currently in the action stage of change. As such, her goal is to continue with weight loss efforts. She has agreed to Category 2 Plan.  Exercise: Keta has been instructed to try a geriatric exercise plan and that some exercise is better than none for weight loss and overall health benefits.   Behavior Modification:  We discussed the following Behavioral Modification Strategies today: increasing lean protein intake, decreasing simple carbohydrates, increasing vegetables, increase H2O intake, increase high fiber foods, no skipping meals, meal planning and cooking strategies, avoiding temptations, and planning  for success. We discussed various medication options to help Irma with her weight loss efforts and we both agreed to continue to work on nutritional and behavioral strategies to promote weight loss and continue current treatment plan.  Follow up in 4 weeks.SABRA She was informed of the importance of frequent follow up visits to maximize her success with intensive lifestyle modifications for her multiple health conditions.  Attestation Statements:   Reviewed by clinician on day of visit: allergies, medications, problem list, medical history, surgical history, family history, social history, and previous encounter notes.   Time  spent on visit including pre-visit chart review and post-visit care and charting was 28 minutes.    Selah Klang, PA-C

## 2024-03-17 ENCOUNTER — Encounter (INDEPENDENT_AMBULATORY_CARE_PROVIDER_SITE_OTHER): Payer: Self-pay | Admitting: Physician Assistant

## 2024-03-17 ENCOUNTER — Ambulatory Visit (INDEPENDENT_AMBULATORY_CARE_PROVIDER_SITE_OTHER): Admitting: Physician Assistant

## 2024-03-17 VITALS — BP 109/75 | HR 71 | Temp 97.9°F | Ht 62.0 in | Wt 218.0 lb

## 2024-03-17 DIAGNOSIS — K5790 Diverticulosis of intestine, part unspecified, without perforation or abscess without bleeding: Secondary | ICD-10-CM

## 2024-03-17 DIAGNOSIS — E559 Vitamin D deficiency, unspecified: Secondary | ICD-10-CM

## 2024-03-17 DIAGNOSIS — Z6839 Body mass index (BMI) 39.0-39.9, adult: Secondary | ICD-10-CM

## 2024-03-17 DIAGNOSIS — E1169 Type 2 diabetes mellitus with other specified complication: Secondary | ICD-10-CM | POA: Diagnosis not present

## 2024-03-17 DIAGNOSIS — E785 Hyperlipidemia, unspecified: Secondary | ICD-10-CM | POA: Diagnosis not present

## 2024-03-17 DIAGNOSIS — G4733 Obstructive sleep apnea (adult) (pediatric): Secondary | ICD-10-CM

## 2024-03-17 DIAGNOSIS — Z7985 Long-term (current) use of injectable non-insulin antidiabetic drugs: Secondary | ICD-10-CM

## 2024-03-17 DIAGNOSIS — K5903 Drug induced constipation: Secondary | ICD-10-CM

## 2024-03-17 DIAGNOSIS — K635 Polyp of colon: Secondary | ICD-10-CM

## 2024-03-17 DIAGNOSIS — E1159 Type 2 diabetes mellitus with other circulatory complications: Secondary | ICD-10-CM

## 2024-03-17 MED ORDER — VITAMIN D (ERGOCALCIFEROL) 1.25 MG (50000 UNIT) PO CAPS
50000.0000 [IU] | ORAL_CAPSULE | ORAL | 0 refills | Status: DC
Start: 2024-03-17 — End: 2024-04-13

## 2024-03-18 ENCOUNTER — Telehealth: Payer: Self-pay

## 2024-03-18 NOTE — Telephone Encounter (Signed)
 Copied from CRM [id number]. Topic: Referral - Prior Authorization Question >> Mar 18, 2024 12:19 PM Aisha D wrote: Reason for CRM:  Lolita from [facility] called regarding the patient's MRI appointments scheduled for August 15 at 4:00 PM and 4:30 PM. She stated that the facilities listed for these appointments are incorrect per the authorization on file. The correct facility should be [facility]. She provided the MPI number [phone] and the Tax ID [id number]. She requested that this information be corrected prior to the patient's August 15 appointment.  UPDATE: Lolita is calling back to check if this has been corrected. She stated that the PA has the incorrect office and needs to be updated to [facility]. She stated the pt's appt is scheduled for tomorrow and needs to be updated today. CB is [phone].

## 2024-03-19 ENCOUNTER — Ambulatory Visit (HOSPITAL_BASED_OUTPATIENT_CLINIC_OR_DEPARTMENT_OTHER)

## 2024-03-25 ENCOUNTER — Other Ambulatory Visit: Payer: Self-pay | Admitting: Gastroenterology

## 2024-03-25 ENCOUNTER — Ambulatory Visit (HOSPITAL_BASED_OUTPATIENT_CLINIC_OR_DEPARTMENT_OTHER)
Admission: RE | Admit: 2024-03-25 | Discharge: 2024-03-25 | Disposition: A | Discharge status: Home / Self Care | Source: Ambulatory Visit | Attending: Family Medicine | Admitting: Family Medicine

## 2024-03-25 DIAGNOSIS — G8929 Other chronic pain: Secondary | ICD-10-CM

## 2024-03-25 DIAGNOSIS — M5416 Radiculopathy, lumbar region: Secondary | ICD-10-CM | POA: Insufficient documentation

## 2024-03-25 DIAGNOSIS — M25561 Pain in right knee: Secondary | ICD-10-CM | POA: Diagnosis present

## 2024-03-27 ENCOUNTER — Other Ambulatory Visit: Payer: Self-pay | Admitting: Family Medicine

## 2024-03-27 DIAGNOSIS — M5136 Other intervertebral disc degeneration, lumbar region with discogenic back pain only: Secondary | ICD-10-CM

## 2024-03-27 DIAGNOSIS — M545 Low back pain, unspecified: Secondary | ICD-10-CM

## 2024-03-29 NOTE — Progress Notes (Signed)
 Knee MRI shows a fair amount of arthritis in the knee which would cause some swelling.  I do think you have a pinched nerve in your back but the lumbar spine MRI is still in process.  I will let you know what we find out about that MRI when we get that one back.

## 2024-04-06 NOTE — Progress Notes (Signed)
 Lumbar spine MRI does show an area where the nerve could be pinched.  This could cause leg and knee pain.  Would you like me to order an back injection?  I think this could help

## 2024-04-08 NOTE — Telephone Encounter (Signed)
Spoke to patient. She will call back to schedule.

## 2024-04-08 NOTE — Telephone Encounter (Signed)
 Can we reach out to pt to assist with scheduling f/u appointment to review results and treatment options.   Thanks!

## 2024-04-13 ENCOUNTER — Encounter (INDEPENDENT_AMBULATORY_CARE_PROVIDER_SITE_OTHER): Payer: Self-pay | Admitting: Physician Assistant

## 2024-04-13 ENCOUNTER — Ambulatory Visit (INDEPENDENT_AMBULATORY_CARE_PROVIDER_SITE_OTHER): Admitting: Physician Assistant

## 2024-04-13 VITALS — BP 115/73 | HR 87 | Temp 98.2°F | Ht 62.0 in | Wt 222.0 lb

## 2024-04-13 DIAGNOSIS — E1169 Type 2 diabetes mellitus with other specified complication: Secondary | ICD-10-CM

## 2024-04-13 DIAGNOSIS — I152 Hypertension secondary to endocrine disorders: Secondary | ICD-10-CM

## 2024-04-13 DIAGNOSIS — Z7984 Long term (current) use of oral hypoglycemic drugs: Secondary | ICD-10-CM

## 2024-04-13 DIAGNOSIS — E785 Hyperlipidemia, unspecified: Secondary | ICD-10-CM

## 2024-04-13 DIAGNOSIS — M1711 Unilateral primary osteoarthritis, right knee: Secondary | ICD-10-CM

## 2024-04-13 DIAGNOSIS — E1159 Type 2 diabetes mellitus with other circulatory complications: Secondary | ICD-10-CM | POA: Diagnosis not present

## 2024-04-13 DIAGNOSIS — E559 Vitamin D deficiency, unspecified: Secondary | ICD-10-CM

## 2024-04-13 DIAGNOSIS — Z6841 Body Mass Index (BMI) 40.0 and over, adult: Secondary | ICD-10-CM

## 2024-04-13 DIAGNOSIS — M4800 Spinal stenosis, site unspecified: Secondary | ICD-10-CM

## 2024-04-13 DIAGNOSIS — M47896 Other spondylosis, lumbar region: Secondary | ICD-10-CM

## 2024-04-13 DIAGNOSIS — Z7985 Long-term (current) use of injectable non-insulin antidiabetic drugs: Secondary | ICD-10-CM

## 2024-04-13 DIAGNOSIS — M519 Unspecified thoracic, thoracolumbar and lumbosacral intervertebral disc disorder: Secondary | ICD-10-CM

## 2024-04-13 MED ORDER — VITAMIN D (ERGOCALCIFEROL) 1.25 MG (50000 UNIT) PO CAPS
50000.0000 [IU] | ORAL_CAPSULE | ORAL | 0 refills | Status: DC
Start: 2024-04-13 — End: 2024-05-11

## 2024-04-13 NOTE — Progress Notes (Signed)
 SUBJECTIVE: Discussed the use of AI scribe software for clinical note transcription with the patient, who gave verbal consent to proceed.  Chief Complaint: Obesity  Interim History: She is up 4 lbs since her last visit. ' Down 19 lbs overall TBW loss of 7.9 %  Leah Lawson is here to discuss her progress with her obesity treatment plan. She is on the Category 2 Plan and states she is following her eating plan approximately 85 % of the time. She states she is exercising walking 20 minutes 2 times per week.  Leah Lawson is a 68 year old female with obesity who presents for follow-up of her obesity treatment plan.  She has gained four pounds since her last visit, with an increase in both muscle and adipose mass. She is on a category two obesity treatment plan and reports approximately 85% compliance. Her physical activity includes walking for 20 minutes twice a week. Stress eating, particularly consuming Doritos, has been a challenge due to family issues, including her grandson being kicked out of the house. She manages stress by reading the Bible and watching TV. Dietary changes include increased protein intake in the mornings and more meat consumption, but she finds salads difficult to eat due to gum issues. She drinks green tea with diet lemonade to stay hydrated.  She experiences ongoing back and leg pain, and reports that another doctor mentioned a possible pinched nerve. The pain is severe when sitting for long periods and radiates to her leg. She reports a persistent lump and swelling in her leg for two to three months. She has a history of arthritis in her knee and back, and reports that her MRI showed some changes, including arthritis and possibly spinal stenosis, as relayed to her by her doctor. Her hands have started giving her trouble again.  She is currently on Mounjaro  12.5 mg weekly, ergocalciferol  50,000 units weekly, Farxiga  10 mg daily, atorvastatin  10 mg every Monday, Wednesday,  and Friday, losartan  hydrochlorothiazide  100-25 mg daily, and spironolactone  25 mg daily. She also takes nerve pills at night and uses oxycodone  for pain relief. She is concerned that her cholesterol medication may be contributing to muscle soreness, as she has experienced similar symptoms with other statins in the past. She takes the cholesterol pill at night and experiences pain and stiffness, particularly in the morning.  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 Compliance with the current weight management plan is approximately 85%. Stress eating due to personal issues has been identified as a contributing factor to weight gain over the past week.  Current physical activity is limited due to pain, but she is walking 20 minutes twice a week. Mounjaro  12.5 mg weekly is being used for primary indication of T2D but also for medical weight loss.  - Continue Mounjaro  12.5 mg weekly -  Encourage stress management techniques to reduce stress eating - Encourage continuation of physical activity as tolerated  Type 2 diabetes mellitus Type 2 diabetes is being managed with Farxiga  10 mg daily and Mounjaro  12.5 mg weekly.  Denies mass in neck, dysphagia, dyspepsia, persistent hoarseness, abdominal pain, or N/V/Constipation or diarrhea. Has annual eye exam. Mood is stable.  Reports no SE from Farxiga  Lab Results  Component  Value Date   HGBA1C 6.2 10/17/2023   HGBA1C 6.1 04/30/2023   HGBA1C 7.5 (H) 11/28/2022   Lab Results  Component Value Date   MICROALBUR <0.7 10/17/2023   LDLCALC 91 02/12/2024   CREATININE 0.90 03/10/2024   Continue Mounjaro  12.56 mg weekly. .No refill needed this visit.  - Continue Farxiga  10 mg daily -She is working  on nutrition plan to decrease simple carbohydrates, increase lean proteins and exercise to promote weight loss and improve glycemic control .Leah Lawson   Hypertension Hypertension is being managed with losartan  hydrochlorothiazide  100-25 mg daily and spironolactone  25 mg daily. BP Readings from Last 3 Encounters:  04/13/24 115/73  03/17/24 109/75  03/10/24 107/73   Lab Results  Component Value Date   NA 138 03/10/2024   CL 102 03/10/2024   K 4.1 03/10/2024   CO2 29 03/10/2024   BUN 33 (H) 03/10/2024   CREATININE 0.90 03/10/2024   GFRNONAA >60 03/10/2024   CALCIUM  9.4 03/10/2024   ALBUMIN 4.1 03/10/2024   GLUCOSE 118 (H) 03/10/2024    - Continue losartan  hydrochlorothiazide  100-25 mg daily - Continue spironolactone  25 mg daily - Continue to work on nutrition plan to promote weight loss and improve BP control.   Hyperlipidemia with history of statin intolerance in the past Currently on atorvastatin  10 mg every Monday, Wednesday, and Friday. Reports muscle soreness and pain, ? may be related to statin use. Previous intolerance to other statins with significant muscle pain noted. Discussion about potential statin myopathy and the possibility of discontinuing atorvastatin  to assess improvement in symptoms. - Patient is seeing Dr. Joane concerning her knee and leg pain issues and will discuss  the possibility of discontinuing atorvastatin  due to muscle pain and previous statin intolerance if Dr. Joane feels this may be possibly contributing to her pain issues. Could also consider CoQ10 supplementation .  Will follow.   Vitamin D  deficiency Vitamin D  deficiency is being  managed with ergocalciferol  50,000 units once weekly. Last vitamin D  Lab Results  Component Value Date   VD25OH 21.05 (L) 10/17/2023   Low vitamin D  levels can be associated with adiposity and may result in leptin resistance and weight gain. Also associated with fatigue.  Currently on vitamin D  supplementation without any adverse effects such as nausea, vomiting or muscle weakness.  - Continue/refill ergocalciferol  50,000 units once weekly.  Meds ordered this encounter  Medications   Vitamin D , Ergocalciferol , (DRISDOL ) 1.25 MG (50000 UNIT) CAPS capsule    Sig: Take 1 capsule (50,000 Units total) by mouth every 7 (seven) days.    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 Pain is exacerbated by certain activities and positions. MRI showed arthritic changes and spinal stenosis. Pain management is complicated by potential statin-related myopathy. Discussion about the possibility of receiving a spinal injection for pain relief. Current pain management includes nerve pills and occasional oxycodone  for severe pain. Pain in the leg  may be due to nerve compression from spinal changes, explaining leg pain without significant back pain. - Has follow up with Dr. Joane later this week.  - ?Consider discontinuing atorvastatin  to assess if it contributes to pain issues +/- add CoQ10 supplementation - Encourage low-impact activities such as pool therapy once cleared by the doctor  Follow-Up Follow-up appointment scheduled to reassess weight management and pain management strategies. - Schedule follow-up appointment for the first week of November, preferably on a Wednesday at 10:00 AM  Vitals Temp: 98.2 F (36.8 C) BP: 115/73 Pulse Rate: 87 SpO2: 95 %   Anthropometric Measurements Height: 5' 2 (1.575 m) Weight: 222 lb (100.7 kg) BMI  (Calculated): 40.59 Weight at Last Visit: 218 lb Weight Lost Since Last Visit: 0 Weight Gained Since Last Visit: 4 lb Starting Weight: 241 lb Total Weight Loss (lbs): 19 lb (8.618 kg) Peak Weight: 250 lb   Body Composition  Body Fat %: 48 % Fat Mass (lbs): 106.8 lbs Muscle Mass (lbs): 110 lbs Total Body Water (lbs): 78.4 lbs Visceral Fat Rating : 17   Other Clinical Data Fasting: No Labs: No Today's Visit #: 20 Starting Date: 11/27/22     ASSESSMENT AND PLAN:  Diet: Leah Lawson is currently in the action stage of change. As such, her goal is to continue with weight loss efforts. She has agreed to Category 2 Plan.  Exercise: Leah Lawson has been instructed to try a geriatric exercise plan and that some exercise is better than none for weight loss and overall health benefits.   Behavior Modification:  We discussed the following Behavioral Modification Strategies today: increasing lean protein intake, decreasing simple carbohydrates, increasing vegetables, increase H2O intake, increase high fiber foods, no skipping meals, meal planning and cooking strategies, better snacking choices, avoiding temptations, and planning for success. We discussed various medication options to help Leah Lawson with her weight loss efforts and we both agreed to continue current treatment plan.  Return in about 4 weeks (around 05/11/2024).Leah Lawson She was informed of the importance of frequent follow up visits to maximize her success with intensive lifestyle modifications for her multiple health conditions.  Attestation Statements:   Reviewed by clinician on day of visit: allergies, medications, problem list, medical history, surgical history, family history, social history, and previous encounter notes.   Time spent on visit including pre-visit chart review and post-visit care and charting was 44 minutes.    Leah Ripley, PA-C

## 2024-04-15 ENCOUNTER — Encounter: Payer: Self-pay | Admitting: Family Medicine

## 2024-04-15 ENCOUNTER — Other Ambulatory Visit: Payer: Self-pay | Admitting: Family Medicine

## 2024-04-15 ENCOUNTER — Telehealth: Payer: Self-pay

## 2024-04-15 ENCOUNTER — Ambulatory Visit (INDEPENDENT_AMBULATORY_CARE_PROVIDER_SITE_OTHER): Admitting: Family Medicine

## 2024-04-15 VITALS — BP 104/80 | HR 54 | Ht 62.0 in | Wt 223.0 lb

## 2024-04-15 DIAGNOSIS — M1711 Unilateral primary osteoarthritis, right knee: Secondary | ICD-10-CM

## 2024-04-15 DIAGNOSIS — M25561 Pain in right knee: Secondary | ICD-10-CM

## 2024-04-15 DIAGNOSIS — M5416 Radiculopathy, lumbar region: Secondary | ICD-10-CM

## 2024-04-15 DIAGNOSIS — G8929 Other chronic pain: Secondary | ICD-10-CM | POA: Diagnosis not present

## 2024-04-15 MED ORDER — OXYCODONE-ACETAMINOPHEN 10-325 MG PO TABS: 1.0000 | ORAL_TABLET | Freq: Every day | ORAL | 0 refills | Status: DC | PRN

## 2024-04-15 MED ORDER — OXYCODONE-ACETAMINOPHEN 10-325 MG PO TABS: 1.0000 | ORAL_TABLET | ORAL | 0 refills | Status: DC | PRN

## 2024-04-15 NOTE — Telephone Encounter (Signed)
 Please auth Zilretta, RIGHT knee

## 2024-04-15 NOTE — Patient Instructions (Addendum)
 Thank you for coming in today.   Please call [facility]) at [phone] to schedule your spine injection.    We are checking insurance benefits for Zilretta  for your knee, we will be in touch after in touch after confirming your insurance benefits and prior authorization requirements.   Schedule a follow up visit about a week after your spine injection

## 2024-04-15 NOTE — Telephone Encounter (Signed)
Refilled at [facility] today

## 2024-04-15 NOTE — Telephone Encounter (Signed)
 Ran for Zilretta  Case# [id number]

## 2024-04-15 NOTE — Progress Notes (Signed)
Note duplication error 
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
## 2024-04-15 NOTE — Addendum Note (Signed)
 Addended by: JOANE ARTIST RAMAN on: 04/15/2024 03:22 PM   Modules accepted: Orders

## 2024-04-16 NOTE — Telephone Encounter (Signed)
 Can you schedule patient when medication is stocked. Thank you   Zilretta  authorized for right knee Coinsurance 90% Copay $20 Deductible does not apply OOP MAX $9350 has met $1625.89 Once OOP has been met coverage goes to 100% Reference [phone]

## 2024-04-21 NOTE — Telephone Encounter (Signed)
Scheduled 9/25

## 2024-04-22 ENCOUNTER — Encounter (HOSPITAL_BASED_OUTPATIENT_CLINIC_OR_DEPARTMENT_OTHER): Payer: Self-pay | Admitting: Pulmonary Disease

## 2024-04-22 ENCOUNTER — Ambulatory Visit (HOSPITAL_BASED_OUTPATIENT_CLINIC_OR_DEPARTMENT_OTHER): Admitting: Pulmonary Disease

## 2024-04-22 VITALS — BP 121/73 | HR 94 | Ht 62.0 in | Wt 223.7 lb

## 2024-04-22 DIAGNOSIS — G4733 Obstructive sleep apnea (adult) (pediatric): Secondary | ICD-10-CM | POA: Diagnosis not present

## 2024-04-22 DIAGNOSIS — J9611 Chronic respiratory failure with hypoxia: Secondary | ICD-10-CM

## 2024-04-22 DIAGNOSIS — J4489 Other specified chronic obstructive pulmonary disease: Secondary | ICD-10-CM

## 2024-04-22 DIAGNOSIS — J449 Chronic obstructive pulmonary disease, unspecified: Secondary | ICD-10-CM | POA: Diagnosis not present

## 2024-04-22 DIAGNOSIS — Z87891 Personal history of nicotine dependence: Secondary | ICD-10-CM

## 2024-04-22 NOTE — Patient Instructions (Signed)
  VISIT SUMMARY: During your visit, we reviewed your chronic respiratory failure with hypoxia, chronic obstructive pulmonary disease (COPD), and obstructive sleep apnea (OSA). We discussed your current treatments, including the use of your CPAP machine, supplemental oxygen , and medications. We also addressed issues related to your insurance coverage for medical supplies and upcoming scans.  YOUR PLAN: -CHRONIC RESPIRATORY FAILURE WITH HYPOXIA AND CHRONIC OBSTRUCTIVE PULMONARY DISEASE (COPD): Chronic respiratory failure with hypoxia means your lungs are not getting enough oxygen  into your blood. COPD is a long-term lung disease that makes it hard to breathe. Continue taking Anoro daily and use albuterol  only when you experience wheezing or shortness of breath. We will work on Insurance underwriter issues to ensure you have access to necessary supplies.  -OBSTRUCTIVE SLEEP APNEA (OSA): OSA is a condition where your breathing stops and starts during sleep. Your OSA is well-controlled with your CPAP therapy. Make sure to tighten your CPAP mask to reduce leaks and continue using the CPAP machine with the current settings. Keep using 4 liters of oxygen  blended into the CPAP machine.  INSTRUCTIONS:  Make sure to use albuterol  only as needed and continue with your current medications and oxygen  therapy.                      Contains text generated by Abridge.                                 Contains text generated by Abridge.

## 2024-04-22 NOTE — Progress Notes (Signed)
 Subjective:    Patient ID: Leah Lawson, female    DOB: 30-Dec-1955, 68 y.o.   MRN: [id number]    68 yo ex-smoker for follow-up of COPD & OSA with nocturnal hypoxia -on CPAP + 4 L O2 She smoked about a pack per day for 40 years until she quit in 2019.  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  History of Present Illness  Discussed the use of AI scribe software for clinical note transcription with the patient, who gave verbal consent to proceed.  History of Present Illness   Leah Lawson is a 68 year old female with chronic respiratory failure with hypoxia and obstructive sleep apnea who presents for a six-month follow-up visit.  She uses her CPAP machine nightly, averaging six hours of sleep per night, and has experienced a leak in the CPAP mask. She adjusts the mask to improve the seal, and her apnea events have decreased to 0.8 events per hour.  She continues to use supplemental oxygen  at four liters per minute. Her breathing is stable, but she has been coughing up phlegm. She takes Anoro daily and uses albuterol  for shortness of breath, though she is unsure about the necessity of daily use.  She is experiencing issues with her new insurance, causing difficulties in covering the costs of her CPAP machine and supplies. She also faces challenges with insurance coverage for upcoming scans and medical appointments.          Significant tests/ events reviewed   CPAP titration 02/2022  >> 7 cm + 4 L O2 HST 12/2021 AHI 62/h, lowest desat 58% PFTs 12/2020 moderate airway obstruction, ratio 57, FEV1 68%, FVC 92%, TLC 109%, DLCO 9.7/53%     05/2023 LDCT chest >> nodules resolved from 10/2022, RADS -1  CT chest WO con 06/2020 emphysema , thyroid   nodule  Review of Systems  neg for any significant sore throat, dysphagia, itching, sneezing, nasal congestion or excess/ purulent secretions, fever, chills, sweats, unintended wt loss, pleuritic or exertional cp, hempoptysis, orthopnea pnd or change in chronic leg swelling. Also denies presyncope, palpitations, heartburn, abdominal pain, nausea, vomiting, diarrhea or change in bowel or urinary habits, dysuria,hematuria, rash, arthralgias, visual complaints, headache, numbness weakness or ataxia.      Objective:   Physical Exam  Gen. Pleasant, obese, in no distress ENT - no lesions, no post nasal drip Neck: No JVD, no thyromegaly, no carotid bruits Lungs: no use of accessory muscles, no dullness to percussion, decreased without rales or rhonchi  Cardiovascular: Rhythm regular, heart sounds  normal, no murmurs or gallops, no peripheral edema Musculoskeletal: No deformities, no cyanosis or clubbing , no tremors       Assessment & Plan:   Assessment and Plan Assessment & Plan  Assessment and Plan    Chronic respiratory failure with hypoxia and chronic obstructive pulmonary disease (COPD) Albuterol  is being used daily without clear indication. She is overweight, which may contribute to dyspnea. Insurance issues are affecting her ability to obtain supplies. - Continue Anoro. - Use albuterol  as needed for wheezing or dyspnea. - Address insurance issues to ensure access to necessary supplies.  Obstructive sleep apnea (OSA) OSA is well-controlled with CPAP therapy. She is compliant, averaging close to six hours per night. There is  a large leak in the mask, likely due to improper fit or hair interference. Apnea events are well controlled with auto settings of 5 to 15 cm H2O, average pressure of 13 cm H2O, and maximum pressure of 14 cm H2O. Continues to use 4 liters of oxygen  blended into the machine. Apnea events reduced to 0.8, indicating excellent control. - Tighten CPAP mask to reduce  leak. - Continue CPAP therapy with current settings. - Continue 4 liters of oxygen  blended into CPAP machine.

## 2024-04-23 ENCOUNTER — Telehealth: Payer: Self-pay | Admitting: Family Medicine

## 2024-04-23 DIAGNOSIS — M5416 Radiculopathy, lumbar region: Secondary | ICD-10-CM

## 2024-04-23 NOTE — Telephone Encounter (Signed)
 Patient called stating that [facility]/ does not accept her insurance ([employer]). Is there somewhere else that we can send her?  I believe that Dr Eldonna at [facility] should be in network and does this procedure?

## 2024-04-27 ENCOUNTER — Ambulatory Visit (INDEPENDENT_AMBULATORY_CARE_PROVIDER_SITE_OTHER): Admitting: Family Medicine

## 2024-04-27 ENCOUNTER — Encounter: Payer: Self-pay | Admitting: Family Medicine

## 2024-04-27 VITALS — BP 126/72 | HR 86 | Temp 97.8°F | Ht 62.0 in | Wt 228.0 lb

## 2024-04-27 DIAGNOSIS — E669 Obesity, unspecified: Secondary | ICD-10-CM

## 2024-04-27 DIAGNOSIS — I10 Essential (primary) hypertension: Secondary | ICD-10-CM | POA: Diagnosis not present

## 2024-04-27 DIAGNOSIS — Z1231 Encounter for screening mammogram for malignant neoplasm of breast: Secondary | ICD-10-CM

## 2024-04-27 DIAGNOSIS — J439 Emphysema, unspecified: Secondary | ICD-10-CM

## 2024-04-27 DIAGNOSIS — J4489 Other specified chronic obstructive pulmonary disease: Secondary | ICD-10-CM

## 2024-04-27 DIAGNOSIS — E1169 Type 2 diabetes mellitus with other specified complication: Secondary | ICD-10-CM

## 2024-04-27 DIAGNOSIS — M25561 Pain in right knee: Secondary | ICD-10-CM

## 2024-04-27 DIAGNOSIS — E559 Vitamin D deficiency, unspecified: Secondary | ICD-10-CM | POA: Diagnosis not present

## 2024-04-27 DIAGNOSIS — D751 Secondary polycythemia: Secondary | ICD-10-CM

## 2024-04-27 DIAGNOSIS — E785 Hyperlipidemia, unspecified: Secondary | ICD-10-CM | POA: Diagnosis not present

## 2024-04-27 DIAGNOSIS — G4733 Obstructive sleep apnea (adult) (pediatric): Secondary | ICD-10-CM | POA: Diagnosis not present

## 2024-04-27 DIAGNOSIS — N1831 Chronic kidney disease, stage 3a: Secondary | ICD-10-CM

## 2024-04-27 LAB — CBC WITH DIFFERENTIAL/PLATELET
Basophils Absolute: 0 K/uL (ref 0.0–0.1)
Basophils Relative: 0.8 % (ref 0.0–3.0)
Eosinophils Absolute: 0.1 K/uL (ref 0.0–0.7)
Eosinophils Relative: 1.3 % (ref 0.0–5.0)
HCT: 43.8 % (ref 36.0–46.0)
Hemoglobin: 14.7 g/dL (ref 12.0–15.0)
Lymphocytes Relative: 22.4 % (ref 12.0–46.0)
Lymphs Abs: 1 K/uL (ref 0.7–4.0)
MCHC: 33.6 g/dL (ref 30.0–36.0)
MCV: 91.3 fl (ref 78.0–100.0)
Monocytes Absolute: 0.3 K/uL (ref 0.1–1.0)
Monocytes Relative: 7.3 % (ref 3.0–12.0)
Neutro Abs: 3 K/uL (ref 1.4–7.7)
Neutrophils Relative %: 68.2 % (ref 43.0–77.0)
Platelets: 236 K/uL (ref 150.0–400.0)
RBC: 4.79 Mil/uL (ref 3.87–5.11)
RDW: 14.5 % (ref 11.5–15.5)
WBC: 4.4 K/uL (ref 4.0–10.5)

## 2024-04-27 LAB — COMPREHENSIVE METABOLIC PANEL WITH GFR
ALT: 13 U/L (ref 0–35)
AST: 15 U/L (ref 0–37)
Albumin: 4.3 g/dL (ref 3.5–5.2)
Alkaline Phosphatase: 62 U/L (ref 39–117)
BUN: 30 mg/dL — ABNORMAL HIGH (ref 6–23)
CO2: 30 meq/L (ref 19–32)
Calcium: 9.7 mg/dL (ref 8.4–10.5)
Chloride: 105 meq/L (ref 96–112)
Creatinine, Ser: 0.97 mg/dL (ref 0.40–1.20)
GFR: 59.78 mL/min — ABNORMAL LOW (ref >60.00)
Glucose, Bld: 93 mg/dL (ref 70–99)
Potassium: 4.8 meq/L (ref 3.5–5.1)
Sodium: 141 meq/L (ref 135–145)
Total Bilirubin: 0.5 mg/dL (ref 0.2–1.2)
Total Protein: 6.8 g/dL (ref 6.0–8.3)

## 2024-04-27 LAB — LIPID PANEL
Cholesterol: 178 mg/dL (ref 0–200)
HDL: 43.2 mg/dL (ref >39.00)
LDL Cholesterol: 118 mg/dL — ABNORMAL HIGH (ref 0–99)
NonHDL: 134.93
Total CHOL/HDL Ratio: 4
Triglycerides: 85 mg/dL (ref 0.0–149.0)
VLDL: 17 mg/dL (ref 0.0–40.0)

## 2024-04-27 LAB — VITAMIN D 25 HYDROXY (VIT D DEFICIENCY, FRACTURES): VITD: 52.55 ng/mL (ref 30.00–100.00)

## 2024-04-27 LAB — TSH: TSH: 0.82 u[IU]/mL (ref 0.35–5.50)

## 2024-04-27 LAB — HEMOGLOBIN A1C: Hgb A1c MFr Bld: 6.5 % (ref 4.6–6.5)

## 2024-04-27 LAB — FERRITIN: Ferritin: 26.6 ng/mL (ref 10.0–291.0)

## 2024-04-27 NOTE — Telephone Encounter (Signed)
 Order placed for back injection with Dr. Eldonna at [facility].

## 2024-04-27 NOTE — Progress Notes (Signed)
 Subjective:     Patient ID: Leah Lawson, female    DOB: 22-Jul-1956, 68 y.o.   MRN: [id number]  Chief Complaint  Patient presents with   Medical Management of Chronic Issues    6 month f/u, needs handicap form    HPI  Discussed the use of AI scribe software for clinical note transcription with the patient, who gave verbal consent to proceed.  History of Present Illness Leah Lawson is a 68 year old female with type 2 diabetes, hypertension, hyperlipidemia, COPD, and obesity who presents for follow-up on her chronic health conditions.  Knee pain - Severe knee pain, particularly at night, disrupting sleep - Oxycodone  used for pain management; pain worsens when medication wears off - Previous intra-articular injection provided temporary relief - Scheduled for another knee injection with sports medicine   Lower extremity edema and discomfort - Significant leg discomfort and swelling - Insurance issues affecting ability to proceed with scheduled back procedure  Facial dermatitis related to cpap use - Facial skin irritation and rash attributed to CPAP machine - Irritation sometimes feels like a cut - Uses unspecified cream and Vaseline as a barrier  Weight gain and gastrointestinal side effects - Weight gain attributed to medication despite low oral intake - Concern for constipation as a medication side effect  Laboratory and imaging follow-up - Due for lab work, not completed at last visit - History of platelet abnormalities monitored by hematology; last evaluation on August 6th - Insurance issues delaying scheduling of MRI and mammogram - Last mammogram performed on November 13th     Health Maintenance Due  Topic Date Due   OPHTHALMOLOGY EXAM  02/20/2023   Influenza Vaccine  03/05/2024   FOOT EXAM  03/25/2024   COVID-19 Vaccine (5 - 2025-26 season) 04/05/2024   Lung Cancer Screening  04/27/2024   Mammogram  06/17/2024    Past Medical History:  Diagnosis Date    Allergy    Arthritis    Asthma    Back pain    Cataract    Chronic kidney disease    Closed fracture of distal end of left radius 11/15/2022   Constipation    COPD (chronic obstructive pulmonary disease) (HCC)    COPD (chronic obstructive pulmonary disease) (HCC)    Diabetes (HCC)    Diabetes mellitus without complication (HCC)    pre-diabetic   Emphysema of lung (HCC)    Glaucoma    Heartburn    High cholesterol    Hypertension    Hypertensive retinopathy 10/26/2021   Lactose intolerance    Open-angle glaucoma 10/26/2021   Other specified disorders of thyroid     Sleep apnea    SOB (shortness of breath)    Vitamin D  deficiency     Past Surgical History:  Procedure Laterality Date   BREAST SURGERY     CARPAL TUNNEL RELEASE Left 12/2023   COLONOSCOPY     WRIST SURGERY Left 11/18/2022    Family History  Problem Relation Age of Onset   Hypertension Mother    Heart failure Mother    Diabetes Mother    High blood pressure Mother    Hypertension Father    Colon polyps Father    Colon cancer Father    Cancer Father        prostate, lung   COPD Father    High Cholesterol Father    High blood pressure Father    Dementia Father    Lung cancer Father    Diabetes Brother  High blood pressure Brother    Stroke Brother    Colon cancer Maternal Grandmother    Cancer Maternal Grandfather    Miscarriages / Stillbirths Daughter    Breast cancer Cousin    Esophageal cancer Neg Hx    Liver disease Neg Hx    Rectal cancer Neg Hx    Stomach cancer Neg Hx     Social History   Socioeconomic History   Marital status: Single    Spouse name: Not on file   Number of children: 4   Years of education: Not on file   Highest education level: Master's degree (e.g., MA, MS, MEng, MEd, MSW, MBA)  Occupational History   Occupation: retired  Tobacco Use   Smoking status: Former    Current packs/day: 0.00    Average packs/day: 0.8 packs/day for 46.0 years (34.5 ttl  pk-yrs)    Types: Cigarettes    Start date: 05/05/1972    Quit date: 05/05/2018    Years since quitting: 5.9    Passive exposure: Past   Smokeless tobacco: Never  Vaping Use   Vaping status: Never Used  Substance and Sexual Activity   Alcohol use: Not Currently   Drug use: Never   Sexual activity: Not on file  Other Topics Concern   Not on file  Social History Narrative   Not on file   Social Drivers of Health   Financial Resource Strain: Medium Risk (04/23/2024)   Overall Financial Resource Strain (CARDIA)    Difficulty of Paying Living Expenses: Somewhat hard  Food Insecurity: Food Insecurity Present (04/23/2024)   Hunger Vital Sign    Worried About Running Out of Food in the Last Year: Never true    Ran Out of Food in the Last Year: Sometimes true  Transportation Needs: No Transportation Needs (04/23/2024)   PRAPARE - Administrator, Civil Service (Medical): No    Lack of Transportation (Non-Medical): No  Physical Activity: Insufficiently Active (04/23/2024)   Exercise Vital Sign    Days of Exercise per Week: 2 days    Minutes of Exercise per Session: 10 min  Stress: No Stress Concern Present (04/23/2024)   [employer] of Occupational Health - Occupational Stress Questionnaire    Feeling of Stress: Not at all  Social Connections: Moderately Integrated (04/23/2024)   Social Connection and Isolation Panel    Frequency of Communication with Friends and Family: More than three times a week    Frequency of Social Gatherings with Friends and Family: Twice a week    Attends Religious Services: More than 4 times per year    Active Member of [employer] or Organizations: Yes    Attends Engineer, structural: More than 4 times per year    Marital Status: Never married  Intimate Partner Violence: Not At Risk (07/11/2023)   Humiliation, Afraid, Rape, and Kick questionnaire    Fear of Current or Ex-Partner: No    Emotionally Abused: No    Physically Abused: No     Sexually Abused: No    Outpatient Medications Prior to Visit  Medication Sig Dispense Refill   albuterol  (VENTOLIN  HFA) 108 (90 Base) MCG/ACT inhaler Inhale 1-2 puffs into the lungs every 6 (six) hours as needed for wheezing or shortness of breath. 9 g 3   atorvastatin  (LIPITOR) 10 MG tablet 1 TABLET MONDAY, WEDNESDAY, AND FRIDAY ONLY 90 tablet 3   COD LIVER OIL PO Take by mouth.     dapagliflozin  propanediol (FARXIGA ) 10 MG  TABS tablet Take 1 tablet by mouth once daily 90 tablet 1   gabapentin  (NEURONTIN ) 100 MG capsule Take 1-3 capsules (100-300 mg total) by mouth 3 (three) times daily as needed. 60 capsule 2   losartan -hydrochlorothiazide  (HYZAAR) 100-25 MG tablet Take 1 tablet by mouth once daily 90 tablet 3   meloxicam  (MOBIC ) 15 MG tablet Take 1 tablet by mouth once daily 30 tablet 0   omeprazole  (PRILOSEC) 20 MG capsule Take 1 capsule by mouth once daily 90 capsule 0   oxyCODONE -acetaminophen  (PERCOCET) 10-325 MG tablet Take 1 tablet by mouth daily as needed for pain. 30 tablet 0   spironolactone  (ALDACTONE ) 25 MG tablet TAKE 1 TABLET BY MOUTH ONCE DAILY . APPOINTMENT REQUIRED FOR FUTURE REFILLS 90 tablet 3   tirzepatide  (MOUNJARO ) 12.5 MG/0.5ML Pen Inject 12.5 mg into the skin once a week. 6 mL 0   umeclidinium-vilanterol (ANORO ELLIPTA ) 62.5-25 MCG/ACT AEPB Inhale 1 puff by mouth once daily 60 each 11   Vitamin D , Ergocalciferol , (DRISDOL ) 1.25 MG (50000 UNIT) CAPS capsule Take 1 capsule (50,000 Units total) by mouth every 7 (seven) days. 4 capsule 0   No facility-administered medications prior to visit.    Allergies  Allergen Reactions   Hydralazine  Swelling    In feet and ankle  hydralazine    Rosuvastatin  Other (See Comments)    Myalgias  rosuvastatin    Singulair [Montelukast Sodium] Other (See Comments)    headaches   Lisinopril Cough   Mucinex [Guaifenesin Er] Diarrhea and Rash    ROS Per HPI    Objective:    Physical Exam Constitutional:      General: She is  not in acute distress.    Appearance: She is not ill-appearing.  HENT:     Mouth/Throat:     Mouth: Mucous membranes are moist.     Pharynx: Oropharynx is clear.  Eyes:     Extraocular Movements: Extraocular movements intact.     Conjunctiva/sclera: Conjunctivae normal.     Pupils: Pupils are equal, round, and reactive to light.  Cardiovascular:     Rate and Rhythm: Normal rate and regular rhythm.  Pulmonary:     Effort: Pulmonary effort is normal.     Breath sounds: Normal breath sounds.  Musculoskeletal:     Cervical back: Normal range of motion and neck supple.     Right lower leg: No edema.     Left lower leg: No edema.  Skin:    General: Skin is warm and dry.  Neurological:     General: No focal deficit present.     Mental Status: She is alert and oriented to person, place, and time.  Psychiatric:        Mood and Affect: Mood normal.        Behavior: Behavior normal.        Thought Content: Thought content normal.      BP 126/72   Pulse 86   Temp 97.8 F (36.6 C) (Temporal)   Ht 5' 2 (1.575 m)   Wt 228 lb (103.4 kg)   SpO2 96%   BMI 41.70 kg/m  Wt Readings from Last 3 Encounters:  04/27/24 228 lb (103.4 kg)  04/22/24 223 lb 11.2 oz (101.5 kg)  04/15/24 223 lb (101.2 kg)        Assessment & Plan:   Problem List Items Addressed This Visit     COPD with chronic bronchitis and emphysema (HCC)   Essential hypertension   Relevant Orders   CBC  with Differential/Platelet (Completed)   Comprehensive metabolic panel with GFR (Completed)   TSH (Completed)   Hyperlipidemia associated with type 2 diabetes mellitus (HCC)   Relevant Orders   Lipid panel (Completed)   OSA on CPAP   Polycythemia   Relevant Orders   CBC with Differential/Platelet (Completed)   Ferritin (Completed)   Stage 3a chronic kidney disease (HCC)   Type 2 diabetes mellitus with obesity - Primary   Relevant Orders   CBC with Differential/Platelet (Completed)   Comprehensive metabolic  panel with GFR (Completed)   Hemoglobin A1c (Completed)   TSH (Completed)   Vitamin D  deficiency   Relevant Orders   VITAMIN D  25 Hydroxy (Vit-D Deficiency, Fractures) (Completed)   Other Visit Diagnoses       Screening mammogram for breast cancer       Relevant Orders   MM 3D SCREENING MAMMOGRAM BILATERAL BREAST     Acute pain of right knee           Assessment and Plan Assessment & Plan Obstructive sleep apnea Obstructive sleep apnea with CPAP use causing facial skin irritation. Previous treatment with clotrimazole -betamethasone  cream not suitable for facial use. - Advise use of Vaseline or Aquaphor as a barrier to prevent skin irritation from CPAP mask.  Intertrigo Intertrigo with irritation under breasts and facial skin. Previous treatment with clotrimazole -betamethasone  cream not suitable for facial use. - Advise use of Vaseline or Aquaphor as a barrier to prevent skin irritation.  Chronic back and knee pain Chronic back and knee pain managed with oxycodone . Managed by sports medicine.  Insurance issues affecting access to MRI and injections for back pain. Previous injection provided temporary relief. - Discuss insurance issues with specialists to ensure coverage for necessary procedures.  Type 2 diabetes mellitus Type 2 diabetes mellitus managed with tirzepatide  and farxiga . -Check A1c, renal function, electrolytes  Essential hypertension Essential hypertension managed with losartan -hydrochlorothiazide  and spironolactone . -check labs   Hyperlipidemia Hyperlipidemia managed with rosuvastatin . Cholesterol levels were in good range during last cardiology visit in July. -check labs  Chronic kidney disease, stage 3a Stage 3a chronic kidney disease. -check renal function   Obesity Obesity managed through [facility] program.  -Reviewed previous notes   Vitamin D  deficiency Vitamin D  deficiency managed with vitamin D  supplementation. -check vitamin  D level   Secondary polycythemia Secondary polycythemia monitored by hematology. Current levels do not require intervention.  General Health Maintenance Issues with insurance coverage affecting access to mammogram and other services. Last mammogram was on November 13th. - Order screening mammogram at the breast center.     I am having Leah Lawson maintain her losartan -hydrochlorothiazide , COD LIVER OIL PO, Anoro Ellipta , atorvastatin , Farxiga , spironolactone , albuterol , tirzepatide , gabapentin , omeprazole , meloxicam , Vitamin D  (Ergocalciferol ), and oxyCODONE -acetaminophen .  No orders of the defined types were placed in this encounter.

## 2024-04-28 ENCOUNTER — Other Ambulatory Visit: Payer: Self-pay | Admitting: Family Medicine

## 2024-04-28 ENCOUNTER — Other Ambulatory Visit

## 2024-04-28 DIAGNOSIS — M545 Low back pain, unspecified: Secondary | ICD-10-CM

## 2024-04-28 DIAGNOSIS — M5136 Other intervertebral disc degeneration, lumbar region with discogenic back pain only: Secondary | ICD-10-CM

## 2024-04-29 ENCOUNTER — Other Ambulatory Visit: Payer: Self-pay

## 2024-04-29 ENCOUNTER — Ambulatory Visit: Admitting: Family Medicine

## 2024-04-29 VITALS — BP 128/82 | HR 83 | Ht 62.0 in | Wt 226.0 lb

## 2024-04-29 DIAGNOSIS — M5416 Radiculopathy, lumbar region: Secondary | ICD-10-CM | POA: Diagnosis not present

## 2024-04-29 DIAGNOSIS — M25561 Pain in right knee: Secondary | ICD-10-CM | POA: Diagnosis not present

## 2024-04-29 DIAGNOSIS — M1711 Unilateral primary osteoarthritis, right knee: Secondary | ICD-10-CM | POA: Diagnosis not present

## 2024-04-29 DIAGNOSIS — G8929 Other chronic pain: Secondary | ICD-10-CM | POA: Diagnosis not present

## 2024-04-29 MED ORDER — TRIAMCINOLONE ACETONIDE 32 MG IX SRER
32.0000 mg | Freq: Once | INTRA_ARTICULAR | Status: AC
  Administered 2024-04-29: 32 mg via INTRA_ARTICULAR

## 2024-04-29 MED ORDER — OXYCODONE-ACETAMINOPHEN 10-325 MG PO TABS: 1.0000 | ORAL_TABLET | Freq: Two times a day (BID) | ORAL | 0 refills | Status: DC | PRN

## 2024-04-29 NOTE — Patient Instructions (Addendum)
 Thank you for coming in today.   You received an injection today. Seek immediate medical attention if the joint becomes red, extremely painful, or is oozing fluid.   [facility] (Dr. Eldonna): [phone]  Let me know if you are not doing well.

## 2024-04-29 NOTE — Progress Notes (Signed)
 Leah Ileana Collet, PhD, LAT, ATC acting as a scribe for Artist Lloyd, MD.  Leah Lawson is a 68 y.o. female who presents to [facility] today for cont'd R knee pain. Pt was last seen by Dr. Lloyd on 04/15/24 and ESI was ordered, as we worked to [employer] Zilretta .  Today, pt reports R knee pain is terrible. Pain worsens throughout the day. [place] had an issue w/ her insurance, so she had to be referred to Dr. Eldonna for [facility]. Pain is disturbing her sleep at night. She is needing to take the percocet twice daily do to severity of pain.   Dx testing: 03/25/24 L-spine & R knee MRI             02/23/24 L-spine XR 01/10/24 R knee XR   Pertinent review of systems: No fevers or chills  Relevant historical information: Hypertension diabetes   Exam:  BP 128/82   Pulse 83   Ht 5' 2 (1.575 m)   Wt 226 lb (102.5 kg)   SpO2 96%   BMI 41.34 kg/m  General: Well Developed, well nourished, and in no acute distress.   MSK: Right knee mild effusion normal motion with crepitation.  Positive right-sided slump test.    Lab and Radiology Results   Zilretta  injection right knee Procedure: Real-time Ultrasound Guided Injection of right knee joint superior lateral patellar space Device: Philips Affiniti 50G Images permanently stored and available for review in PACS Verbal informed consent obtained.  Discussed risks and benefits of procedure. Warned about infection, hyperglycemia bleeding, damage to structures among others. Patient expresses understanding and agreement Time-out conducted.   Noted no overlying erythema, induration, or other signs of local infection.   Skin prepped in a sterile fashion.   Local anesthesia: Topical Ethyl chloride.   With sterile technique and under real time ultrasound guidance: Zilretta  32 mg injected into knee joint. Fluid seen entering the joint capsule.   Completed without difficulty   Advised to call if fevers/chills, erythema, induration,  drainage, or persistent bleeding.   Images permanently stored and available for review in the ultrasound unit.  Impression: Technically successful ultrasound guided injection.  Lot number: [id number]  No results found.     Assessment and Plan: 68 y.o. female with chronic right knee and leg pain.  Plan for Zilretta  injection right knee.  She also has coexisting lumbar radiculopathy involving the right leg.  Plan for epidural steroid injection if not sufficient or improved will refer to neurosurgery.  Oxycodone  prescribed for pain control.  She is currently taking this medicine twice daily.  I advised her to try to limit doses but if she has to escalate dose will need to refer to pain management.   PDMP reviewed during this encounter. Orders Placed This Encounter  Procedures   US  LIMITED JOINT SPACE STRUCTURES LOW RIGHT(NO LINKED CHARGES)    Reason for Exam (SYMPTOM  OR DIAGNOSIS REQUIRED):   right knee pain    Preferred imaging location?:   [facility]   Meds ordered this encounter  Medications   oxyCODONE -acetaminophen  (PERCOCET) 10-325 MG tablet    Sig: Take 1 tablet by mouth 2 (two) times daily as needed for pain.    Dispense:  60 tablet    Refill:  0    Chronic pain   Triamcinolone  Acetonide (ZILRETTA ) intra-articular injection 32 mg     Discussed warning signs or symptoms. Please see discharge instructions. Patient expresses understanding.   The above  documentation has been reviewed and is accurate and complete Artist Lawson, M.D.

## 2024-05-03 ENCOUNTER — Ambulatory Visit: Payer: Self-pay | Admitting: Family Medicine

## 2024-05-03 ENCOUNTER — Other Ambulatory Visit (HOSPITAL_BASED_OUTPATIENT_CLINIC_OR_DEPARTMENT_OTHER): Payer: Self-pay | Admitting: Pulmonary Disease

## 2024-05-10 NOTE — Progress Notes (Unsigned)
 SUBJECTIVE: Discussed the use of AI scribe software for clinical note transcription with the patient, who gave verbal consent to proceed.  Chief Complaint: Obesity  Interim History: She is down 6 lbs since her last visit.   Quentin is here to discuss her progress with her obesity treatment plan. She is on the Category 2 Plan and states she is following her eating plan approximately 90 % of the time. She states she is exercising walking 20 minutes 3 times per week.  Leah Lawson is a 68 year old female with obesity who presents for follow-up on her obesity treatment plan.  She has experienced a weight loss of six pounds since her last visit, although she noted a temporary weight gain of four to five pounds when visiting two other doctors in the same week.  She attributes this fluctuation to her medication, specifically gabapentin , which she believes may be contributing to her weight gain. She feels hungry more frequently, particularly in the afternoons and evenings, despite adhering to her meal plan. She consumes 1200 to 1300 calories per day with 85 grams of protein but questions whether she is getting enough calories as she feels intensely hungry at times. She does note that this increased hunger is usually around the time she should be eating and we discussed that this may be more normal hunger signals that what she had in the past.  Discussed repeating metabolism testing at next visit- Fasting IC  She reports no side effects such as nausea, vomiting, diarrhea, or constipation from Mounjaro .  She was previously on atorvastatin  but has stopped it for nearly two weeks due to concerns about muscle pain, particularly in her legs. She is uncertain if the medication is the cause of her leg discomfort, but notes some improvement in her leg pain since discontinuing the statin.  She is also taking gabapentin  for nerve relaxation, which she suspects may be contributing to her increased hunger and  weight gain prior to this week. She has been experiencing hunger pains and has been eating more than usual, including cookies and pancakes, which she attributes to stress from personal responsibilities, such as taking her grandson to work early in the morning.   Her social history includes caring for her 64 year old grandson, who is in college and currently without a car, requiring her to drive him to work several days a week. This responsibility has contributed to her stress and altered her daily routine, including her sleep patterns.  Fasting IC and any labs in follow up . Advised to arrive 30 minutes prior to appt time to complete fasting IC.   OBJECTIVE: Visit Diagnoses: Problem List Items Addressed This Visit     Morbid obesity (HCC) with starting BMI 44   Relevant Medications   tirzepatide  (MOUNJARO ) 12.5 MG/0.5ML Pen   Vitamin D  deficiency   Relevant Medications   Vitamin D , Ergocalciferol , (DRISDOL ) 1.25 MG (50000 UNIT) CAPS capsule   Hyperlipidemia associated with type 2 diabetes mellitus (HCC)   Relevant Medications   tirzepatide  (MOUNJARO ) 12.5 MG/0.5ML Pen   Lumbar disc disease   Other Visit Diagnoses       Type 2 diabetes mellitus in patient with obesity (HCC)    -  Primary   Relevant Medications   tirzepatide  (MOUNJARO ) 12.5 MG/0.5ML Pen     Hypertension associated with diabetes (HCC)       Relevant Medications   tirzepatide  (MOUNJARO ) 12.5 MG/0.5ML Pen     BMI 39.0-39.9,adult Current BMI 39.6  Obesity [name] has lost 6 pounds since her last visit, with a reduction in adipose tissue from 106.8 pounds to 101 pounds. She reports feeling hungry, which may be related to her current medication regimen, including gabapentin  for leg/back pain. Her current caloric intake on Cat. 2 plan is 1200-1300 calories with 85 grams of protein. There is a consideration to increase her caloric intake if her metabolism has changed due to weight loss. - Perform a fasting indirect  calorimetry test to assess current metabolism at the next appointment. - Consider increasing caloric intake based on metabolism test results. - Continue current weight loss plan with Mounjaro  for primary indication of T2DM but also to promote medical weight loss.  Type 2 diabetes mellitus Her blood sugars are under better control, which may be contributing to more normal hunger signals. The current management plan includes Mounjaro  12.5 mg weekly. Denies mass in neck, dysphagia, dyspepsia, persistent hoarseness, abdominal pain, or N/V/Constipation or diarrhea. Has annual eye exam. Mood is stable.  Lab Results  Component Value Date   HGBA1C 6.5 04/27/2024   HGBA1C 6.2 10/17/2023   HGBA1C 6.1 04/30/2023   Lab Results  Component Value Date   MICROALBUR <0.7 10/17/2023   LDLCALC 118 (H) 04/27/2024   CREATININE 0.97 04/27/2024   Plan:  Continue/refill Mounjaro  12.5 mg weekly.  She is working  on nutrition plan to decrease simple carbohydrates, increase lean proteins and exercise to promote weight loss and improve glycemic control . Meds ordered this encounter  Medications   tirzepatide  (MOUNJARO ) 12.5 MG/0.5ML Pen    Sig: Inject 12.5 mg into the skin once a week.    Dispense:  6 mL    Refill:  0   Vitamin D , Ergocalciferol , (DRISDOL ) 1.25 MG (50000 UNIT) CAPS capsule    Sig: Take 1 capsule (50,000 Units total) by mouth every 14 (fourteen) days.    Dispense:  4 capsule    Refill:  0     Hyperlipidemia Her LDL cholesterol remains higher than desired, especially given her diabetes. She has been on atorvastatin  but is currently on a two-week break due to potential side effects, including muscle soreness. There is a discussion about exploring non-statin options if atorvastatin  is not tolerated. Lab Results  Component Value Date   CHOL 178 04/27/2024   CHOL 158 02/12/2024   CHOL 181 10/17/2023   Lab Results  Component Value Date   HDL 43.20 04/27/2024   HDL 55 02/12/2024   HDL  43.10 10/17/2023   Lab Results  Component Value Date   LDLCALC 118 (H) 04/27/2024   LDLCALC 91 02/12/2024   LDLCALC 120 (H) 10/17/2023   Lab Results  Component Value Date   TRIG 85.0 04/27/2024   TRIG 57 02/12/2024   TRIG 88.0 10/17/2023   Lab Results  Component Value Date   CHOLHDL 4 04/27/2024   CHOLHDL 2.9 02/12/2024   CHOLHDL 4 10/17/2023   No results found for: LDLDIRECT Plan:  Continue to work on nutrition plan -decreasing simple carbohydrates, increasing lean proteins, decreasing saturated fats and cholesterol , avoiding trans fats and exercise as able to promote weight loss, improve lipids and decrease cardiovascular risks. She is currently holding her atorvastatin  x 2 weeks as directed due to lower extremity pain.  Continue Mounjaro  12.5 mg weekly which also should lower CV risks.  She has not tolerated other statin medications in the past and concerns if atorvastatin  is contributing to some of her lower extremity pain .  - Contact Dr. Raford or  primary care provider to discuss statin intolerance and alternative treatments for hyperlipidemia.  Back pain with intervertebral disc disorder She reports improvement in leg pain and mobility, possibly related to discontinuation of atorvastatin  as a trial as she has not tolerated other statins in the past.  She is set up for LES[facility] with Dr. Eldonna over the next couple of weeks as well.  She continues gabapentin  and we discussed tendency to gain weight with gabapentin  over time.   Vitamin D  deficiency Her vitamin D  levels have improved from 21 to 52, reaching the target range. She has been taking vitamin D  weekly. Last vitamin D  Lab Results  Component Value Date   VD25OH 52.55 04/27/2024   Decrease/refill Ergocalciferol  50,000 units every 14 days and monitor periodically to optimize supplementation.  -Low vitamin D  levels can be associated with adiposity and may result in leptin resistance and weight gain. Also associated  with fatigue.  Currently on vitamin D  supplementation without any adverse effects such as nausea, vomiting or muscle weakness.   Vitals Temp: 98.3 F (36.8 C) BP: 117/76 Pulse Rate: 90 SpO2: 95 %   Anthropometric Measurements Height: 5' 2 (1.575 m) Weight: 216 lb (98 kg) BMI (Calculated): 39.5 Weight at Last Visit: 222 lb Weight Lost Since Last Visit: 6 lb Weight Gained Since Last Visit: 0 Starting Weight: 241 lb Total Weight Loss (lbs): 25 lb (11.3 kg) Peak Weight: 250 lb   Body Composition  Body Fat %: 46.6 % Fat Mass (lbs): 101 lbs Muscle Mass (lbs): 109.8 lbs Total Body Water (lbs): 75.2 lbs Visceral Fat Rating : 16   Other Clinical Data Fasting: No Labs: No Today's Visit #: 21 Starting Date: 11/27/22     ASSESSMENT AND PLAN:  Diet: Tanayah is currently in the action stage of change. As such, her goal is to continue with weight loss efforts. She has agreed to Category 2 Plan.  Exercise: Amijah has been instructed to try a geriatric exercise plan and that some exercise is better than none for weight loss and overall health benefits.   Behavior Modification:  We discussed the following Behavioral Modification Strategies today: increasing lean protein intake, decreasing simple carbohydrates, increasing vegetables, increase H2O intake, increase high fiber foods, meal planning and cooking strategies, avoiding temptations, and planning for success. We discussed various medication options to help Charda with her weight loss efforts and we both agreed to continue current treatment plan.  Return in about 4 weeks (around 06/08/2024) for Fasting Lab, Fasting IC.SABRA She was informed of the importance of frequent follow up visits to maximize her success with intensive lifestyle modifications for her multiple health conditions.  Attestation Statements:   Reviewed by clinician on day of visit: allergies, medications, problem list, medical history, surgical history,  family history, social history, and previous encounter notes.   Time spent on visit including pre-visit chart review and post-visit care and charting was 37 minutes.    Saphira Lahmann, PA-C

## 2024-05-11 ENCOUNTER — Encounter (INDEPENDENT_AMBULATORY_CARE_PROVIDER_SITE_OTHER): Payer: Self-pay | Admitting: Physician Assistant

## 2024-05-11 ENCOUNTER — Ambulatory Visit (INDEPENDENT_AMBULATORY_CARE_PROVIDER_SITE_OTHER): Admitting: Physician Assistant

## 2024-05-11 VITALS — BP 117/76 | HR 90 | Temp 98.3°F | Ht 62.0 in | Wt 216.0 lb

## 2024-05-11 DIAGNOSIS — Z7985 Long-term (current) use of injectable non-insulin antidiabetic drugs: Secondary | ICD-10-CM

## 2024-05-11 DIAGNOSIS — E119 Type 2 diabetes mellitus without complications: Secondary | ICD-10-CM

## 2024-05-11 DIAGNOSIS — E669 Obesity, unspecified: Secondary | ICD-10-CM

## 2024-05-11 DIAGNOSIS — E1169 Type 2 diabetes mellitus with other specified complication: Secondary | ICD-10-CM | POA: Diagnosis not present

## 2024-05-11 DIAGNOSIS — Z6839 Body mass index (BMI) 39.0-39.9, adult: Secondary | ICD-10-CM

## 2024-05-11 DIAGNOSIS — E785 Hyperlipidemia, unspecified: Secondary | ICD-10-CM

## 2024-05-11 DIAGNOSIS — M1711 Unilateral primary osteoarthritis, right knee: Secondary | ICD-10-CM

## 2024-05-11 DIAGNOSIS — M519 Unspecified thoracic, thoracolumbar and lumbosacral intervertebral disc disorder: Secondary | ICD-10-CM

## 2024-05-11 DIAGNOSIS — I152 Hypertension secondary to endocrine disorders: Secondary | ICD-10-CM

## 2024-05-11 DIAGNOSIS — E1159 Type 2 diabetes mellitus with other circulatory complications: Secondary | ICD-10-CM

## 2024-05-11 DIAGNOSIS — E559 Vitamin D deficiency, unspecified: Secondary | ICD-10-CM

## 2024-05-11 MED ORDER — TIRZEPATIDE 12.5 MG/0.5ML ~~LOC~~ SOAJ: 12.5000 mg | SUBCUTANEOUS | 0 refills | Status: DC

## 2024-05-11 MED ORDER — VITAMIN D (ERGOCALCIFEROL) 1.25 MG (50000 UNIT) PO CAPS: 50000.0000 [IU] | ORAL_CAPSULE | ORAL | 0 refills | Status: DC

## 2024-05-14 NOTE — Telephone Encounter (Signed)
 Copied from CRM #[id number]. Topic: Clinical - Request for Lab/Test Order >> May 13, 2024  4:29 PM CMA Silvano SQUIBB wrote: Insurance has hung up on me again today.  I will try again tomorrow

## 2024-05-25 ENCOUNTER — Ambulatory Visit: Admitting: Physical Medicine and Rehabilitation

## 2024-05-25 ENCOUNTER — Other Ambulatory Visit: Payer: Self-pay

## 2024-05-25 VITALS — BP 113/76 | HR 90

## 2024-05-25 DIAGNOSIS — M5416 Radiculopathy, lumbar region: Secondary | ICD-10-CM

## 2024-05-25 DIAGNOSIS — M48062 Spinal stenosis, lumbar region with neurogenic claudication: Secondary | ICD-10-CM

## 2024-05-25 MED ORDER — METHYLPREDNISOLONE ACETATE 40 MG/ML IJ SUSP
40.0000 mg | Freq: Once | INTRAMUSCULAR | Status: AC
  Administered 2024-05-25: 40 mg
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
## 2024-05-25 NOTE — Progress Notes (Signed)
 Pain Scale   Average Pain 7 Patient advising she has lower back pain radiating to right leg at times. Patient advising her pain is constant        +Driver, -BT, -Dye Allergies.

## 2024-05-25 NOTE — Procedures (Signed)
 Lumbar Epidural Steroid Injection - Interlaminar Approach with Fluoroscopic Guidance  Patient: Leah Lawson      Date of Birth: 1956/03/25 MRN: [id number] PCP: Lendia Boby CROME, NP-C      Visit Date: 05/25/2024   Universal Protocol:     Consent Given By: the patient  Position: PRONE  Additional Comments: Vital signs were monitored before and after the procedure. Patient was prepped and draped in the usual sterile fashion. The correct patient, procedure, and site was verified.   Injection Procedure Details:   Procedure diagnoses: Lumbar radiculopathy [M54.16]   Meds Administered:  Meds ordered this encounter  Medications   methylPREDNISolone  acetate (DEPO-MEDROL ) injection 40 mg     Laterality: Right  Location/Site:  L4-5  Needle: 3.5 in., 20 ga. Tuohy  Needle Placement: Paramedian epidural  Findings:   -Comments: Excellent flow of contrast into the epidural space.  Procedure Details: Using a paramedian approach from the side mentioned above, the region overlying the inferior lamina was localized under fluoroscopic visualization and the soft tissues overlying this structure were infiltrated with 4 ml. of 1% Lidocaine without Epinephrine. The Tuohy needle was inserted into the epidural space using a paramedian approach.   The epidural space was localized using loss of resistance along with counter oblique bi-planar fluoroscopic views.  After negative aspirate for air, blood, and CSF, a 2 ml. volume of Isovue-250 was injected into the epidural space and the flow of contrast was observed. Radiographs were obtained for documentation purposes.    The injectate was administered into the level noted above.   Additional Comments:  The patient tolerated the procedure well Dressing: 2 x 2 sterile gauze and Band-Aid    Post-procedure details: Patient was observed during the procedure. Post-procedure instructions were reviewed.  Patient left the clinic in stable  condition.

## 2024-05-26 ENCOUNTER — Other Ambulatory Visit: Payer: Self-pay | Admitting: Cardiovascular Disease

## 2024-05-31 ENCOUNTER — Other Ambulatory Visit: Payer: Self-pay | Admitting: Family Medicine

## 2024-05-31 DIAGNOSIS — M545 Low back pain, unspecified: Secondary | ICD-10-CM

## 2024-05-31 DIAGNOSIS — M5136 Other intervertebral disc degeneration, lumbar region with discogenic back pain only: Secondary | ICD-10-CM

## 2024-06-03 ENCOUNTER — Other Ambulatory Visit: Payer: Self-pay | Admitting: Family Medicine

## 2024-06-07 ENCOUNTER — Encounter: Payer: Self-pay | Admitting: Radiology

## 2024-06-07 ENCOUNTER — Other Ambulatory Visit: Payer: Self-pay | Admitting: Gastroenterology

## 2024-06-08 NOTE — Progress Notes (Unsigned)
 SUBJECTIVE: Discussed the use of AI scribe software for clinical note transcription with the patient, who gave verbal consent to proceed.  Chief Complaint: Obesity  Interim History: She has maintained her weight since her last visit.  Down 25 lbs overall TBW loss of 10.4%  Leah Lawson is here to discuss her progress with her obesity treatment plan. She is on the Category 2 Plan and states she is following her eating plan approximately 85-90 % of the time. She states she is exercising walking 2 times per week. Leah Lawson is a 68 year old female with obesity who presents for follow-up of her obesity treatment plan.  She has experienced a decrease in her metabolic rate from 1800 to 1555 calories. She feels hungry primarily in the afternoons and is concerned about her body's reduced ability to burn calories due to pain in her back and knees. Despite trying to stay active by moving around and cleaning, her mobility has been limited recently.  She has type 2 diabetes and is currently taking Farxiga  without any reported issues. She is also on Mounjaro  and has had some gastrointestinal issues in the past two days; she reports not having enough water and eating more chocolate during that time. She is not currently taking her cholesterol medication due to concerns about a recall.  Her arthritis affects her back and knees, and she reports reduced mobility. She has experienced fluctuations in her kidney function. She is mindful of her water consumption.  She practices intermittent fasting, typically starting to eat around 10:30 AM and stopping by 8:00 PM. She has been getting approximately eight hours of sleep recently, which she believes helps manage her weight. She is cautious about her diet, especially around holidays, and is trying to avoid bringing home sweets and other high-calorie foods.  No excessive hunger or cravings, except for occasional sweets in the afternoon. Recent gastrointestinal  issues, likely due to dietary choices.  OBJECTIVE: Visit Diagnoses: Problem List Items Addressed This Visit     Morbid obesity (HCC) with starting BMI 44   Relevant Medications   tirzepatide  (MOUNJARO ) 12.5 MG/0.5ML Pen   Vitamin D  deficiency   Hyperlipidemia associated with type 2 diabetes mellitus (HCC)   Relevant Medications   tirzepatide  (MOUNJARO ) 12.5 MG/0.5ML Pen   OSA on CPAP   Lumbar disc disease   SOBOE (shortness of breath on exertion) - Primary   Other Visit Diagnoses       Type 2 diabetes mellitus in patient with obesity (HCC)       Relevant Medications   tirzepatide  (MOUNJARO ) 12.5 MG/0.5ML Pen     Hypertension associated with diabetes (HCC)       Relevant Medications   tirzepatide  (MOUNJARO ) 12.5 MG/0.5ML Pen     Primary osteoarthritis of right knee         BMI 39.0-39.9,adult Current BMI 39.6         SOBOE   Obesity  Obesity with a recent weight loss of 25 pounds. Current metabolic rate is 8444 calories per day, down from 1800 calories per day. Reports increased hunger in the afternoons but no excessive cravings for sweets. Recent stress eating due to family events and availability of candy. Current dietary plan is a category two plan, which should promote gradual weight loss. Muscle mass has remained stable despite reduced activity due to pain. - Continue current dietary plan with a focus on protein intake. - Monitor activity level and adjust caloric intake if necessary. - Avoid bringing high-calorie foods home  from events like Thanksgiving. - Scheduled follow-up appointment on December 10th at 10 AM.  Type 2 diabetes mellitus Type 2 diabetes managed with Farxiga . No reported issues with current medication regimen. - Continue Farxiga  as prescribed.  Osteoarthritis of right knee Osteoarthritis of the right knee contributing to reduced mobility and activity level. Pain management is crucial to improve mobility and metabolic rate. - Encouraged gradual  increase in physical activity as pain allows.  Intervertebral disc disorder, thoracic/thoracolumbar/lumbosacral region Intervertebral disc disorder causing pain and reduced mobility, impacting metabolic rate and dietary adherence. - Encouraged gradual increase in physical activity as pain allows.  General Health Maintenance Kidney function slightly below normal range, likely due to inadequate hydration. No immediate need for dialysis. Vitamin D  supplementation is ongoing. - Encouraged increased water intake to improve kidney function. - Continue vitamin D  supplementation as needed. Vitals Temp: 98 F (36.7 C) BP: 108/76 Pulse Rate: 95 SpO2: 92 %   Anthropometric Measurements Height: 5' 2 (1.575 m) Weight: 216 lb (98 kg) BMI (Calculated): 39.5 Weight at Last Visit: 216 lb Weight Lost Since Last Visit: 0 Weight Gained Since Last Visit: 0 Starting Weight: 241 lb Total Weight Loss (lbs): 25 lb (11.3 kg) Peak Weight: 250 lb   Body Composition  Body Fat %: 47.8 % Fat Mass (lbs): 103.4 lbs Muscle Mass (lbs): 107.2 lbs Total Body Water (lbs): 75.4 lbs Visceral Fat Rating : 16   Other Clinical Data RMR: 1555 Fasting: Yes Labs: Yes Today's Visit #: 22 Starting Date: 11/27/22 Comments: IC DONE TODAY     ASSESSMENT AND PLAN:  Diet: Leah Lawson is currently in the action stage of change. As such, her goal is to continue with weight loss efforts. She has agreed to Category 2 Plan.  Exercise: Leah Lawson has been instructed to work up to a goal of 150 minutes of combined cardio and strengthening exercise per week, to try a geriatric exercise plan, and that some exercise is better than none for weight loss and overall health benefits.   Behavior Modification:  We discussed the following Behavioral Modification Strategies today: increasing lean protein intake, decreasing simple carbohydrates, increasing vegetables, increase H2O intake, increase high fiber foods, meal planning and  cooking strategies, better snacking choices, holiday eating strategies, avoiding temptations, and planning for success. We discussed various medication options to help Leah Lawson with her weight loss efforts and we both agreed to continue current treatment plan with consideration to increase Mounjaro  at next visit if she remains plateaued..  Return in about 4 weeks (around 07/07/2024).Leah Lawson She was informed of the importance of frequent follow up visits to maximize her success with intensive lifestyle modifications for her multiple health conditions.  Attestation Statements:   Reviewed by clinician on day of visit: allergies, medications, problem list, medical history, surgical history, family history, social history, and previous encounter notes.   Time spent on visit including pre-visit chart review and post-visit care and charting was *** minutes.    Paiden Caraveo, PA-C

## 2024-06-09 ENCOUNTER — Encounter (INDEPENDENT_AMBULATORY_CARE_PROVIDER_SITE_OTHER): Payer: Self-pay | Admitting: Physician Assistant

## 2024-06-09 ENCOUNTER — Ambulatory Visit (INDEPENDENT_AMBULATORY_CARE_PROVIDER_SITE_OTHER): Payer: Self-pay | Admitting: Physician Assistant

## 2024-06-09 VITALS — BP 108/76 | HR 95 | Temp 98.0°F | Ht 62.0 in | Wt 216.0 lb

## 2024-06-09 DIAGNOSIS — E669 Obesity, unspecified: Secondary | ICD-10-CM

## 2024-06-09 DIAGNOSIS — E119 Type 2 diabetes mellitus without complications: Secondary | ICD-10-CM

## 2024-06-09 DIAGNOSIS — I152 Hypertension secondary to endocrine disorders: Secondary | ICD-10-CM

## 2024-06-09 DIAGNOSIS — E559 Vitamin D deficiency, unspecified: Secondary | ICD-10-CM

## 2024-06-09 DIAGNOSIS — M5187 Other intervertebral disc disorders, lumbosacral region: Secondary | ICD-10-CM | POA: Diagnosis not present

## 2024-06-09 DIAGNOSIS — M5185 Other intervertebral disc disorders, thoracolumbar region: Secondary | ICD-10-CM

## 2024-06-09 DIAGNOSIS — M1711 Unilateral primary osteoarthritis, right knee: Secondary | ICD-10-CM

## 2024-06-09 DIAGNOSIS — R0602 Shortness of breath: Secondary | ICD-10-CM | POA: Diagnosis not present

## 2024-06-09 DIAGNOSIS — G4733 Obstructive sleep apnea (adult) (pediatric): Secondary | ICD-10-CM | POA: Diagnosis not present

## 2024-06-09 DIAGNOSIS — Z7985 Long-term (current) use of injectable non-insulin antidiabetic drugs: Secondary | ICD-10-CM

## 2024-06-09 DIAGNOSIS — Z6839 Body mass index (BMI) 39.0-39.9, adult: Secondary | ICD-10-CM

## 2024-06-09 DIAGNOSIS — M519 Unspecified thoracic, thoracolumbar and lumbosacral intervertebral disc disorder: Secondary | ICD-10-CM

## 2024-06-09 DIAGNOSIS — Z7984 Long term (current) use of oral hypoglycemic drugs: Secondary | ICD-10-CM

## 2024-06-09 DIAGNOSIS — E1169 Type 2 diabetes mellitus with other specified complication: Secondary | ICD-10-CM

## 2024-06-09 MED ORDER — TIRZEPATIDE 12.5 MG/0.5ML ~~LOC~~ SOAJ: 12.5000 mg | SUBCUTANEOUS | 0 refills | Status: DC

## 2024-06-15 ENCOUNTER — Encounter: Payer: Self-pay | Admitting: Acute Care

## 2024-06-16 ENCOUNTER — Telehealth: Payer: Self-pay | Admitting: *Deleted

## 2024-06-16 DIAGNOSIS — Z87891 Personal history of nicotine dependence: Secondary | ICD-10-CM

## 2024-06-16 DIAGNOSIS — Z122 Encounter for screening for malignant neoplasm of respiratory organs: Secondary | ICD-10-CM

## 2024-06-16 NOTE — Telephone Encounter (Signed)
I am working on this one.

## 2024-06-16 NOTE — Telephone Encounter (Signed)
 Routing to [place], [facility] to review due to notes in previous order for LDCT regarding scheduling and insurance for this patient.  New order placed for annual LDCT

## 2024-06-16 NOTE — Telephone Encounter (Signed)
 Copied from CRM [id number]. Topic: Appointments - Appointment Scheduling >> Jun 15, 2024  9:52 AM Leila C wrote: Patient/patient representative is calling to schedule an appointment. Refer to attachments for appointment information.  Patient [phone] states has been trying to get a hold for a week to schedule CT lung cancer screening appointment. Patient states called this morning and left a message Provided patient with Lung cancer screening phone number [phone]. Please call back.

## 2024-06-17 NOTE — Telephone Encounter (Signed)
 New pt initiated in aviality:

## 2024-06-18 ENCOUNTER — Ambulatory Visit
Admission: RE | Admit: 2024-06-18 | Discharge: 2024-06-18 | Disposition: A | Discharge status: Home / Self Care | Source: Ambulatory Visit | Attending: Family Medicine | Admitting: Family Medicine

## 2024-06-18 ENCOUNTER — Telehealth: Payer: Self-pay

## 2024-06-18 DIAGNOSIS — Z1231 Encounter for screening mammogram for malignant neoplasm of breast: Secondary | ICD-10-CM

## 2024-06-18 NOTE — Telephone Encounter (Signed)
 Copied from CRM #[id number]. Topic: General - Other >> Jun 18, 2024  9:56 AM Nessti S wrote: Reason for CRM: called to make pcp aware that he can see health risk assessment and care plan on the provider portal.

## 2024-06-22 ENCOUNTER — Other Ambulatory Visit: Payer: Self-pay

## 2024-06-22 ENCOUNTER — Emergency Department (HOSPITAL_COMMUNITY)

## 2024-06-22 ENCOUNTER — Encounter (HOSPITAL_COMMUNITY): Payer: Self-pay

## 2024-06-22 ENCOUNTER — Emergency Department (HOSPITAL_COMMUNITY)
Admission: EM | Admit: 2024-06-22 | Discharge: 2024-06-22 | Disposition: A | Discharge status: Home / Self Care | Attending: Emergency Medicine | Admitting: Emergency Medicine

## 2024-06-22 DIAGNOSIS — M25561 Pain in right knee: Secondary | ICD-10-CM | POA: Diagnosis not present

## 2024-06-22 DIAGNOSIS — S3992XA Unspecified injury of lower back, initial encounter: Secondary | ICD-10-CM | POA: Diagnosis present

## 2024-06-22 DIAGNOSIS — I129 Hypertensive chronic kidney disease with stage 1 through stage 4 chronic kidney disease, or unspecified chronic kidney disease: Secondary | ICD-10-CM | POA: Diagnosis not present

## 2024-06-22 DIAGNOSIS — N189 Chronic kidney disease, unspecified: Secondary | ICD-10-CM | POA: Insufficient documentation

## 2024-06-22 DIAGNOSIS — Y92410 Unspecified street and highway as the place of occurrence of the external cause: Secondary | ICD-10-CM | POA: Insufficient documentation

## 2024-06-22 DIAGNOSIS — E1122 Type 2 diabetes mellitus with diabetic chronic kidney disease: Secondary | ICD-10-CM | POA: Diagnosis not present

## 2024-06-22 DIAGNOSIS — S39012A Strain of muscle, fascia and tendon of lower back, initial encounter: Secondary | ICD-10-CM | POA: Diagnosis not present

## 2024-06-22 DIAGNOSIS — J4489 Other specified chronic obstructive pulmonary disease: Secondary | ICD-10-CM | POA: Insufficient documentation

## 2024-06-22 DIAGNOSIS — Z79899 Other long term (current) drug therapy: Secondary | ICD-10-CM | POA: Diagnosis not present

## 2024-06-22 MED ORDER — ACETAMINOPHEN 500 MG PO TABS
1000.0000 mg | ORAL_TABLET | Freq: Once | ORAL | Status: AC
  Administered 2024-06-22: 1000 mg via ORAL
  Filled 2024-06-22: qty 2
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
## 2024-06-22 NOTE — ED Notes (Signed)
 Ambulated with patient. This patient had some discomfort but no more than to be expected. Patient was not unbalanced.

## 2024-06-22 NOTE — Telephone Encounter (Signed)
 Rec'd [email or web address] [id number] from 06/25/24-07/26/24.

## 2024-06-24 ENCOUNTER — Telehealth: Payer: Self-pay

## 2024-06-24 NOTE — Telephone Encounter (Signed)
 Closing & adding to open telephone note.

## 2024-06-24 NOTE — Telephone Encounter (Signed)
 Copied from CRM [id number]. Topic: Appointments - Scheduling Inquiry for Clinic >> Jun 24, 2024  9:11 AM Leah Lawson wrote: Reason for CRM: Pt returning [place] phone call regarding scheduling CT, contacted [facility] no answer. Please f/u with pt.  Routing to [place]

## 2024-06-25 ENCOUNTER — Ambulatory Visit: Payer: Self-pay
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
## 2024-06-29 ENCOUNTER — Encounter: Payer: Self-pay | Admitting: Family Medicine

## 2024-06-29 ENCOUNTER — Ambulatory Visit (HOSPITAL_BASED_OUTPATIENT_CLINIC_OR_DEPARTMENT_OTHER)
Admission: RE | Admit: 2024-06-29 | Discharge: 2024-06-29 | Disposition: A | Discharge status: Home / Self Care | Source: Ambulatory Visit | Attending: Acute Care | Admitting: Acute Care

## 2024-06-29 ENCOUNTER — Ambulatory Visit (INDEPENDENT_AMBULATORY_CARE_PROVIDER_SITE_OTHER): Admitting: Family Medicine

## 2024-06-29 DIAGNOSIS — G8929 Other chronic pain: Secondary | ICD-10-CM

## 2024-06-29 DIAGNOSIS — M791 Myalgia, unspecified site: Secondary | ICD-10-CM | POA: Diagnosis not present

## 2024-06-29 DIAGNOSIS — Z87891 Personal history of nicotine dependence: Secondary | ICD-10-CM | POA: Diagnosis present

## 2024-06-29 DIAGNOSIS — M25561 Pain in right knee: Secondary | ICD-10-CM | POA: Diagnosis not present

## 2024-06-29 DIAGNOSIS — G2581 Restless legs syndrome: Secondary | ICD-10-CM

## 2024-06-29 DIAGNOSIS — M519 Unspecified thoracic, thoracolumbar and lumbosacral intervertebral disc disorder: Secondary | ICD-10-CM | POA: Diagnosis not present

## 2024-06-29 DIAGNOSIS — M25562 Pain in left knee: Secondary | ICD-10-CM

## 2024-06-29 DIAGNOSIS — G5602 Carpal tunnel syndrome, left upper limb: Secondary | ICD-10-CM

## 2024-06-29 DIAGNOSIS — Z122 Encounter for screening for malignant neoplasm of respiratory organs: Secondary | ICD-10-CM | POA: Insufficient documentation

## 2024-06-29 DIAGNOSIS — M51369 Other intervertebral disc degeneration, lumbar region without mention of lumbar back pain or lower extremity pain: Secondary | ICD-10-CM | POA: Diagnosis not present

## 2024-06-29 MED ORDER — OXYCODONE-ACETAMINOPHEN 10-325 MG PO TABS: 1.0000 | ORAL_TABLET | Freq: Three times a day (TID) | ORAL | 0 refills | Status: AC | PRN

## 2024-06-29 MED ORDER — TIZANIDINE HCL 4 MG PO TABS: 4.0000 mg | ORAL_TABLET | Freq: Three times a day (TID) | ORAL | 0 refills | Status: AC | PRN
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
## 2024-06-29 NOTE — Patient Instructions (Signed)
 May use heat or ice to the area for relief as needed.  May use topical rubs or gels to the area as needed for relief.  I have sent in a muscle relaxer for you to use one tablet as needed for muscle spasms. This medication can make you sleepy. Do not drive until you know how this medication affects you.   Follow up with Dr Joane as scheduled.   Follow-up with me for new or worsening symptoms.

## 2024-07-07 ENCOUNTER — Other Ambulatory Visit: Payer: Self-pay

## 2024-07-08 ENCOUNTER — Encounter: Payer: Self-pay | Admitting: Family Medicine

## 2024-07-08 ENCOUNTER — Telehealth: Payer: Self-pay | Admitting: Physical Medicine and Rehabilitation

## 2024-07-08 ENCOUNTER — Ambulatory Visit: Admitting: Family Medicine

## 2024-07-08 ENCOUNTER — Ambulatory Visit

## 2024-07-08 VITALS — BP 112/80 | HR 83 | Ht 62.0 in | Wt 224.0 lb

## 2024-07-08 DIAGNOSIS — M545 Low back pain, unspecified: Secondary | ICD-10-CM | POA: Diagnosis not present

## 2024-07-08 DIAGNOSIS — M25512 Pain in left shoulder: Secondary | ICD-10-CM

## 2024-07-08 DIAGNOSIS — M25562 Pain in left knee: Secondary | ICD-10-CM | POA: Diagnosis not present

## 2024-07-08 DIAGNOSIS — M25561 Pain in right knee: Secondary | ICD-10-CM

## 2024-07-08 DIAGNOSIS — G8929 Other chronic pain: Secondary | ICD-10-CM

## 2024-07-08 MED ORDER — PREDNISONE 10 MG (48) PO TBPK: ORAL_TABLET | Freq: Every day | ORAL | 0 refills | Status: DC

## 2024-07-08 MED ORDER — OXYCODONE-ACETAMINOPHEN 10-325 MG PO TABS: 1.0000 | ORAL_TABLET | Freq: Two times a day (BID) | ORAL | 0 refills | Status: DC | PRN

## 2024-07-08 NOTE — Telephone Encounter (Signed)
 Patient called and said she wants to schedule for back injection. CB#[phone]

## 2024-07-08 NOTE — Patient Instructions (Addendum)
 Thank you for coming in today.   Please get an Xray today before you leave   I've referred you to Physical Therapy.  Let us  know if you don't hear from them in one week.  Call Dr. Lyda office to schedule another back injection: [phone]  Check back with me in 2 weeks
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
## 2024-07-08 NOTE — Telephone Encounter (Signed)
 Re-auth Zilretta , RIGHT knee

## 2024-07-09 ENCOUNTER — Telehealth: Payer: Self-pay | Admitting: Family Medicine
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
## 2024-07-12 NOTE — Telephone Encounter (Signed)
 Forwarding to Dr. Denyse Amass to review and advise.

## 2024-07-13 NOTE — Telephone Encounter (Signed)
 Well we can try somebody else if you would like.  What are your thoughts?

## 2024-07-14 ENCOUNTER — Ambulatory Visit: Payer: Self-pay | Admitting: Family Medicine

## 2024-07-14 ENCOUNTER — Ambulatory Visit (INDEPENDENT_AMBULATORY_CARE_PROVIDER_SITE_OTHER): Admitting: Physician Assistant

## 2024-07-14 NOTE — Telephone Encounter (Signed)
 Order placed for [facility] with [facility] ([facility]

## 2024-07-14 NOTE — Telephone Encounter (Signed)
 Forwarding to Dr. Joane to review and advise of location for back injection, has Dr. Eldonna recommended holding off on the injection, and pt states that [facility] wouldn't accept insurance even though it was approved for another test or same.

## 2024-07-14 NOTE — Progress Notes (Signed)
 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

## 2024-07-14 NOTE — Addendum Note (Signed)
 Addended by: MARDY LEOTIS RAMAN on: 07/14/2024 07:16 AM   Modules accepted: Orders

## 2024-07-14 NOTE — Progress Notes (Signed)
 Left shoulder x-ray shows mild arthritis in the main shoulder joint and medium arthritis in the small joint at the top of the shoulder.

## 2024-07-14 NOTE — Progress Notes (Signed)
 Left knee x-ray does show some arthritis.

## 2024-07-16 ENCOUNTER — Other Ambulatory Visit: Payer: Self-pay | Admitting: Acute Care

## 2024-07-16 ENCOUNTER — Ambulatory Visit: Payer: Medicare Other

## 2024-07-16 VITALS — Ht 62.0 in | Wt 224.0 lb

## 2024-07-16 DIAGNOSIS — Z0000 Encounter for general adult medical examination without abnormal findings: Secondary | ICD-10-CM | POA: Diagnosis not present

## 2024-07-16 DIAGNOSIS — Z122 Encounter for screening for malignant neoplasm of respiratory organs: Secondary | ICD-10-CM

## 2024-07-16 DIAGNOSIS — Z87891 Personal history of nicotine dependence: Secondary | ICD-10-CM

## 2024-07-16 NOTE — Patient Instructions (Addendum)
 Leah Lawson,  Thank you for taking the time for your Medicare Wellness Visit. I appreciate your continued commitment to your health goals. Please review the care plan we discussed, and feel free to reach out if I can assist you further.  Please note that Annual Wellness Visits do not include a physical exam. Some assessments may be limited, especially if the visit was conducted virtually. If needed, we may recommend an in-person follow-up with your provider.  Ongoing Care Seeing your primary care provider every 3 to 6 months helps us  monitor your health and provide consistent, personalized care. Next office visit on 10/26/2024.  You are due for an diabetic eye exam.  Please let us  know when you find a new eye care provider.  You are also due for a Flu vaccine and can get that done at your local pharmacy and a foot exam, which will be done during your next office visit with Vickie.   Referrals If a referral was made during today's visit and you haven't received any updates within two weeks, please contact the referred provider directly to check on the status.  Recommended Screenings:  Health Maintenance  Topic Date Due   Eye exam for diabetics  02/20/2023   Flu Shot  03/05/2024   Complete foot exam   03/25/2024   COVID-19 Vaccine (5 - 2025-26 season) 04/05/2024   Medicare Annual Wellness Visit  07/10/2024   Yearly kidney health urinalysis for diabetes  10/16/2024   Hemoglobin A1C  10/25/2024   Yearly kidney function blood test for diabetes  04/27/2025   Breast Cancer Screening  06/18/2025   Screening for Lung Cancer  06/29/2025   Colon Cancer Screening  02/17/2027   DTaP/Tdap/Td vaccine (3 - Td or Tdap) 04/27/2030   Pneumococcal Vaccine for age over 48  Completed   Osteoporosis screening with Bone Density Scan  Completed   Hepatitis C Screening  Completed   Meningitis B Vaccine  Aged Out   Zoster (Shingles) Vaccine  Discontinued       07/12/2024   11:54 AM  Advanced Directives   Does Patient Have a Medical Advance Directive? No    Vision: Annual vision screenings are recommended for early detection of glaucoma, cataracts, and diabetic retinopathy. These exams can also reveal signs of chronic conditions such as diabetes and high blood pressure.  Dental: Annual dental screenings help detect early signs of oral cancer, gum disease, and other conditions linked to overall health, including heart disease and diabetes.  Please see the attached documents for additional preventive care recommendations.

## 2024-07-16 NOTE — Progress Notes (Signed)
 Chief Complaint  Patient presents with   Medicare Wellness     Subjective:   Leah Lawson is a 68 y.o. female who presents for a Medicare Annual Wellness Visit.  Visit info / Clinical Intake: Medicare Wellness Visit Type:: Subsequent Annual Wellness Visit Persons participating in visit and providing information:: patient Medicare Wellness Visit Mode:: Video Since this visit was completed virtually, some vitals may be partially provided or unavailable. Missing vitals are due to the limitations of the virtual format.: Unable to obtain vitals - no equipment If Telephone or Video please confirm:: I connected with patient using audio/video enable telemedicine. I verified patient identity with two identifiers, discussed telehealth limitations, and patient agreed to proceed. Patient Location:: home Provider Location:: office Interpreter Needed?: No Pre-visit prep was completed: yes AWV questionnaire completed by patient prior to visit?: yes Date:: 07/12/24 Living arrangements:: (!) (Patient-Rptd) lives alone Patient's Overall Health Status Rating: (Patient-Rptd) good Typical amount of pain: (!) (Patient-Rptd) a lot Does pain affect daily life?: (!) (Patient-Rptd) yes Are you currently prescribed opioids?: (!) yes  Dietary Habits and Nutritional Risks How many meals a day?: (Patient-Rptd) 3 Eats fruit and vegetables daily?: (Patient-Rptd) yes Most meals are obtained by: (Patient-Rptd) preparing own meals In the last 2 weeks, have you had any of the following?: none Diabetic:: (!) yes Any non-healing wounds?: no How often do you check your BS?: 0 Would you like to be referred to a Nutritionist or for Diabetic Management? : no  Functional Status Activities of Daily Living (to include ambulation/medication): (Patient-Rptd) Independent Ambulation: Independent Medication Administration: (Patient-Rptd) Independent Home Management (perform basic housework or laundry): (Patient-Rptd)  Independent Manage your own finances?: (Patient-Rptd) yes Primary transportation is: (Patient-Rptd) driving Concerns about vision?: no *vision screening is required for WTM* Concerns about hearing?: no  Fall Screening Falls in the past year?: (Patient-Rptd) 0 Number of falls in past year: 0 Was there an injury with Fall?: 0 Fall Risk Category Calculator: 0 Patient Fall Risk Level: Low Fall Risk  Fall Risk Patient at Risk for Falls Due to: No Fall Risks Fall risk Follow up: Falls evaluation completed; Falls prevention discussed  Home and Transportation Safety: All rugs have non-skid backing?: (Patient-Rptd) N/A, no rugs All stairs or steps have railings?: (Patient-Rptd) N/A, no stairs Grab bars in the bathtub or shower?: (Patient-Rptd) yes Have non-skid surface in bathtub or shower?: (Patient-Rptd) yes Good home lighting?: (Patient-Rptd) yes Regular seat belt use?: (Patient-Rptd) yes Hospital stays in the last year:: (Patient-Rptd) no  Cognitive Assessment Difficulty concentrating, remembering, or making decisions? : no Will 6CIT or Mini Cog be Completed: no 6CIT or Mini Cog Declined: patient alert, oriented, able to answer questions appropriately and recall recent events  Advance Directives (For Healthcare) Does Patient Have a Medical Advance Directive?: No  Reviewed/Updated  Reviewed/Updated: Reviewed All (Medical, Surgical, Family, Medications, Allergies, Care Teams, Patient Goals)    Allergies (verified) Hydralazine , Rosuvastatin , Singulair [montelukast sodium], Lisinopril, and Mucinex [guaifenesin er]   Current Medications (verified) Outpatient Encounter Medications as of 07/16/2024  Medication Sig   COD LIVER OIL PO Take by mouth.   dapagliflozin  propanediol (FARXIGA ) 10 MG TABS tablet Take 1 tablet by mouth once daily   losartan -hydrochlorothiazide  (HYZAAR) 100-25 MG tablet Take 1 tablet by mouth once daily   meloxicam  (MOBIC ) 15 MG tablet Take 1 tablet by mouth  once daily   omeprazole  (PRILOSEC) 20 MG capsule TAKE 1 CAPSULE BY MOUTH ONCE DAILY . APPOINTMENT REQUIRED FOR FUTURE REFILLS   oxyCODONE -acetaminophen  (PERCOCET) 10-325  MG tablet Take 1 tablet by mouth 2 (two) times daily as needed for pain.   predniSONE  (STERAPRED UNI-PAK 48 TAB) 10 MG (48) TBPK tablet Take by mouth daily. 12 day dosepack po   spironolactone  (ALDACTONE ) 25 MG tablet TAKE 1 TABLET BY MOUTH ONCE DAILY . APPOINTMENT REQUIRED FOR FUTURE REFILLS   tirzepatide  (MOUNJARO ) 12.5 MG/0.5ML Pen Inject 12.5 mg into the skin once a week.   tiZANidine  (ZANAFLEX ) 4 MG tablet Take 1 tablet (4 mg total) by mouth every 8 (eight) hours as needed for muscle spasms.   umeclidinium-vilanterol (ANORO ELLIPTA ) 62.5-25 MCG/ACT AEPB Inhale 1 puff by mouth once daily   VENTOLIN  HFA 108 (90 Base) MCG/ACT inhaler INHALE 1 TO 2 PUFFS BY MOUTH EVERY 6 HOURS AS NEEDED FOR WHEEZING FOR SHORTNESS OF BREATH (Patient taking differently: 1-2 puffs every 4 (four) hours as needed.)   Vitamin D , Ergocalciferol , (DRISDOL ) 1.25 MG (50000 UNIT) CAPS capsule Take 1 capsule (50,000 Units total) by mouth every 14 (fourteen) days.   atorvastatin  (LIPITOR) 10 MG tablet 1 TABLET MONDAY, WEDNESDAY, AND FRIDAY ONLY (Patient not taking: Reported on 07/16/2024)   gabapentin  (NEURONTIN ) 100 MG capsule Take 1-3 capsules (100-300 mg total) by mouth 3 (three) times daily as needed. (Patient not taking: Reported on 07/16/2024)   No facility-administered encounter medications on file as of 07/16/2024.    History: Past Medical History:  Diagnosis Date   Allergy    Arthritis    Asthma    Back pain    Cataract    Chronic kidney disease    Closed fracture of distal end of left radius 11/15/2022   Constipation    COPD (chronic obstructive pulmonary disease) (HCC)    COPD (chronic obstructive pulmonary disease) (HCC)    Diabetes (HCC)    Diabetes mellitus without complication (HCC)    pre-diabetic   Emphysema of lung (HCC)     Glaucoma    Heartburn    High cholesterol    Hypertension    Hypertensive retinopathy 10/26/2021   Lactose intolerance    Open-angle glaucoma 10/26/2021   Other specified disorders of thyroid     Sleep apnea    SOB (shortness of breath)    Vitamin D  deficiency    Past Surgical History:  Procedure Laterality Date   BREAST SURGERY     CARPAL TUNNEL RELEASE Left 12/2023   COLONOSCOPY     WRIST SURGERY Left 11/18/2022   Family History  Problem Relation Age of Onset   Hypertension Mother    Heart failure Mother    Diabetes Mother    High blood pressure Mother    Hypertension Father    Colon polyps Father    Colon cancer Father    Cancer Father        prostate, lung   COPD Father    High Cholesterol Father    High blood pressure Father    Dementia Father    Lung cancer Father    Diabetes Brother    High blood pressure Brother    Stroke Brother    Colon cancer Maternal Grandmother    Cancer Maternal Grandfather    Miscarriages / Stillbirths Daughter    Breast cancer Cousin    Esophageal cancer Neg Hx    Liver disease Neg Hx    Rectal cancer Neg Hx    Stomach cancer Neg Hx    Social History   Occupational History   Occupation: retired  Tobacco Use   Smoking status: Former  Current packs/day: 0.00    Average packs/day: 0.8 packs/day for 46.0 years (34.5 ttl pk-yrs)    Types: Cigarettes    Start date: 05/05/1972    Quit date: 05/05/2018    Years since quitting: 6.2    Passive exposure: Past   Smokeless tobacco: Never  Vaping Use   Vaping status: Never Used  Substance and Sexual Activity   Alcohol use: Not Currently   Drug use: Never   Sexual activity: Not on file   Tobacco Counseling Counseling given: Not Answered  SDOH Screenings   Food Insecurity: Food Insecurity Present (07/12/2024)  Housing: Low Risk (07/12/2024)  Recent Concern: Housing - High Risk (04/23/2024)  Transportation Needs: No Transportation Needs (07/12/2024)  Utilities: Not At Risk  (07/16/2024)  Alcohol Screen: Low Risk (07/08/2023)  Depression (PHQ2-9): Low Risk (07/16/2024)  Financial Resource Strain: Medium Risk (07/12/2024)  Physical Activity: Insufficiently Active (07/12/2024)  Social Connections: Moderately Integrated (07/12/2024)  Stress: No Stress Concern Present (07/12/2024)  Tobacco Use: Medium Risk (07/16/2024)  Health Literacy: Adequate Health Literacy (07/16/2024)   See flowsheets for full screening details  Depression Screen PHQ 2 & 9 Depression Scale- Over the past 2 weeks, how often have you been bothered by any of the following problems? Little interest or pleasure in doing things: 0 Feeling down, depressed, or hopeless (PHQ Adolescent also includes...irritable): 0 PHQ-2 Total Score: 0 Trouble falling or staying asleep, or sleeping too much: 0 Feeling tired or having little energy: 0 Poor appetite or overeating (PHQ Adolescent also includes...weight loss): 0 Feeling bad about yourself - or that you are a failure or have let yourself or your family down: 0 Trouble concentrating on things, such as reading the newspaper or watching television (PHQ Adolescent also includes...like school work): 0 Moving or speaking so slowly that other people could have noticed. Or the opposite - being so fidgety or restless that you have been moving around a lot more than usual: 0 Thoughts that you would be better off dead, or of hurting yourself in some way: 0 PHQ-9 Total Score: 0 If you checked off any problems, how difficult have these problems made it for you to do your work, take care of things at home, or get along with other people?: Not difficult at all  Depression Treatment Depression Interventions/Treatment : EYV7-0 Score <4 Follow-up Not Indicated     Goals Addressed             This Visit's Progress    Patient Stated   On track    07/11/2023, wants to lose weight             Objective:    Today's Vitals   07/16/24 1007  Weight: 224 lb (101.6  kg)  Height: 5' 2 (1.575 m)   Body mass index is 40.97 kg/m.  Hearing/Vision screen Hearing Screening - Comments:: Denies hearing difficulties   Vision Screening - Comments:: Had cataract surgery/not UTD/ looking for a new provider Immunizations and Health Maintenance Health Maintenance  Topic Date Due   OPHTHALMOLOGY EXAM  02/20/2023   Influenza Vaccine  03/05/2024   FOOT EXAM  03/25/2024   COVID-19 Vaccine (5 - 2025-26 season) 04/05/2024   Diabetic kidney evaluation - Urine ACR  10/16/2024   HEMOGLOBIN A1C  10/25/2024   Diabetic kidney evaluation - eGFR measurement  04/27/2025   Mammogram  06/18/2025   Lung Cancer Screening  06/29/2025   Medicare Annual Wellness (AWV)  07/16/2025   Colonoscopy  02/17/2027   DTaP/Tdap/Td (3 -  Td or Tdap) 04/27/2030   Pneumococcal Vaccine: 50+ Years  Completed   Bone Density Scan  Completed   Hepatitis C Screening  Completed   Meningococcal B Vaccine  Aged Out   Zoster Vaccines- Shingrix  Discontinued        Assessment/Plan:  This is a routine wellness examination for Leah Lawson.  Patient Care Team: Lendia Boby CROME, NP-C as PCP - General (Family Medicine) Abigail Maude POUR as Consulting Physician [facility]  I have personally reviewed and noted the following in the patients chart:   Medical and social history Use of alcohol, tobacco or illicit drugs  Current medications and supplements including opioid prescriptions. Functional ability and status Nutritional status Physical activity Advanced directives List of other physicians Hospitalizations, surgeries, and ER visits in previous 12 months Vitals Screenings to include cognitive, depression, and falls Referrals and appointments  No orders of the defined types were placed in this encounter.  In addition, I have reviewed and discussed with patient certain preventive protocols, quality metrics, and best practice recommendations. A written  personalized care plan for preventive services as well as general preventive health recommendations were provided to patient.   Amato Sevillano L Awilda Covin, CMA   07/16/2024   Return in 1 year (on 07/16/2025).  After Visit Summary: (MyChart) Due to this being a telephonic visit, the after visit summary with patients personalized plan was offered to patient via MyChart   Nurse Notes: Patient is due for a flu vaccine.  She is currently looking for a new eye care provider.  Patient is also due for a foot exam and can get that done during her next office visit.  She had no other concerns to address today.

## 2024-07-19 NOTE — Telephone Encounter (Signed)
 Order placed for back injection with Dr. Gaylord at [facility].

## 2024-07-19 NOTE — Addendum Note (Signed)
 Addended by: MARDY LEOTIS RAMAN on: 07/19/2024 07:24 AM   Modules accepted: Orders

## 2024-07-20 ENCOUNTER — Encounter (INDEPENDENT_AMBULATORY_CARE_PROVIDER_SITE_OTHER): Payer: Self-pay | Admitting: Adult Health

## 2024-07-20 ENCOUNTER — Ambulatory Visit (INDEPENDENT_AMBULATORY_CARE_PROVIDER_SITE_OTHER): Admitting: Adult Health

## 2024-07-20 VITALS — BP 129/80 | HR 96 | Temp 98.4°F | Ht 62.0 in | Wt 221.0 lb

## 2024-07-20 DIAGNOSIS — E669 Obesity, unspecified: Secondary | ICD-10-CM

## 2024-07-20 DIAGNOSIS — E559 Vitamin D deficiency, unspecified: Secondary | ICD-10-CM

## 2024-07-20 DIAGNOSIS — Z6839 Body mass index (BMI) 39.0-39.9, adult: Secondary | ICD-10-CM

## 2024-07-20 DIAGNOSIS — R0602 Shortness of breath: Secondary | ICD-10-CM

## 2024-07-20 MED ORDER — VITAMIN D (ERGOCALCIFEROL) 1.25 MG (50000 UNIT) PO CAPS: 50000.0000 [IU] | ORAL_CAPSULE | ORAL | 0 refills | Status: DC
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
## 2024-07-22 ENCOUNTER — Ambulatory Visit: Admitting: Family Medicine

## 2024-07-22 ENCOUNTER — Ambulatory Visit
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
## 2024-07-27 ENCOUNTER — Ambulatory Visit (INDEPENDENT_AMBULATORY_CARE_PROVIDER_SITE_OTHER): Admitting: Family Medicine

## 2024-07-27 VITALS — BP 118/78 | HR 97 | Ht 62.0 in

## 2024-07-27 DIAGNOSIS — M5416 Radiculopathy, lumbar region: Secondary | ICD-10-CM | POA: Diagnosis not present

## 2024-07-27 DIAGNOSIS — G8929 Other chronic pain: Secondary | ICD-10-CM | POA: Diagnosis not present

## 2024-07-27 DIAGNOSIS — M545 Low back pain, unspecified: Secondary | ICD-10-CM | POA: Diagnosis not present

## 2024-07-27 MED ORDER — OXYCODONE-ACETAMINOPHEN 10-325 MG PO TABS: 1.0000 | ORAL_TABLET | Freq: Two times a day (BID) | ORAL | 0 refills | Status: DC | PRN
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
## 2024-07-27 NOTE — Patient Instructions (Addendum)
 Thank you for coming in today.   [facility] (Dr. Bonner):  [phone]  Check back with me as needed

## 2024-07-28 ENCOUNTER — Ambulatory Visit

## 2024-07-28 DIAGNOSIS — M25561 Pain in right knee: Secondary | ICD-10-CM | POA: Insufficient documentation

## 2024-07-28 DIAGNOSIS — M545 Low back pain, unspecified: Secondary | ICD-10-CM | POA: Insufficient documentation

## 2024-07-28 DIAGNOSIS — M5459 Other low back pain: Secondary | ICD-10-CM | POA: Insufficient documentation

## 2024-07-28 DIAGNOSIS — G8929 Other chronic pain: Secondary | ICD-10-CM | POA: Insufficient documentation

## 2024-07-28 DIAGNOSIS — M25512 Pain in left shoulder: Secondary | ICD-10-CM | POA: Insufficient documentation

## 2024-07-28 DIAGNOSIS — M25562 Pain in left knee: Secondary | ICD-10-CM | POA: Insufficient documentation

## 2024-08-03 ENCOUNTER — Telehealth: Payer: Self-pay

## 2024-08-06 ENCOUNTER — Telehealth: Payer: Self-pay

## 2024-08-06 NOTE — Telephone Encounter (Signed)
 Zilretta  benefits ran for right knee Case ID [id number]

## 2024-08-09 NOTE — Telephone Encounter (Addendum)
 Zilretta  benefits could not go through as patients insurance is showing as inactive.  Patient does not have a follow up visit scheduled with us  will run patient again once this is asked for by patient or provider/provider assistant.

## 2024-08-10 ENCOUNTER — Other Ambulatory Visit: Payer: Self-pay | Admitting: Family Medicine

## 2024-08-10 NOTE — Telephone Encounter (Signed)
 Pt switched to [facility] Dual plan this year. States pharmacy only gave her 7 days of the pain meds as it sounds like a PA is needed. Please contact [facility] if any questions.  Also, pt is scheduled to see Dr. Bonner on 08/12/2024.
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
## 2024-08-11 ENCOUNTER — Ambulatory Visit: Attending: Family Medicine

## 2024-08-11 DIAGNOSIS — G8929 Other chronic pain: Secondary | ICD-10-CM | POA: Insufficient documentation

## 2024-08-11 DIAGNOSIS — M25512 Pain in left shoulder: Secondary | ICD-10-CM | POA: Insufficient documentation

## 2024-08-11 DIAGNOSIS — M5459 Other low back pain: Secondary | ICD-10-CM | POA: Insufficient documentation

## 2024-08-11 DIAGNOSIS — M25562 Pain in left knee: Secondary | ICD-10-CM | POA: Diagnosis present

## 2024-08-11 DIAGNOSIS — M25561 Pain in right knee: Secondary | ICD-10-CM | POA: Insufficient documentation

## 2024-08-11 NOTE — Telephone Encounter (Signed)
 Prior Authorization initiated for OXYCODONE -APAP via CoverMyMeds.com KEY: [id number]  DX: M54.24, G89.29  Tried and failed Acetaminophen , IBU, Naproxen, and Meloxicam 

## 2024-08-12 ENCOUNTER — Telehealth: Payer: Self-pay

## 2024-08-12 ENCOUNTER — Other Ambulatory Visit: Payer: Self-pay | Admitting: Physical Medicine and Rehabilitation

## 2024-08-12 DIAGNOSIS — M5416 Radiculopathy, lumbar region: Secondary | ICD-10-CM

## 2024-08-12 NOTE — Telephone Encounter (Signed)
 SABRA

## 2024-08-12 NOTE — Telephone Encounter (Signed)
 Copied from CRM [id number]. Topic: General - Other >> Aug 12, 2024  3:12 PM Wess RAMAN wrote: Reason for CRM: Patient would like to speak with Lendia Nordmann, NP-C. She stated she needs a referral sent to her specialty doctors from her PCP.   Callback #: [phone]

## 2024-08-12 NOTE — Telephone Encounter (Signed)
 Pt called from the [facility] office, frustrated and very upset that she was not getting a shot today w/ Dr. Bonner. I explained that we placed the referral to Dr. Bonner for her to be evaluated, look at her images, and discuss potential for spinal injection. Pt argued that she was referred for the shot and Dr. Joane sent me here for another shot. I again explained. She continued to argue. She asked if she should go back to Dr. Eldonna and I clarified that we changed the referral do to her insurance. Pt asked what she should do and I said proceed with the visit w/ Dr. Bonner. Pt then abruptly ended the call.

## 2024-08-12 NOTE — Telephone Encounter (Signed)
 Patient requesting another injection however is unable to answer any of the injection screening question

## 2024-08-12 NOTE — Telephone Encounter (Signed)
 Fax received requesting additional information. Form completed and faxed back to [facility] via FAX machine.
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
## 2024-08-13 MED ORDER — OXYCODONE-ACETAMINOPHEN 10-325 MG PO TABS: 1.0000 | ORAL_TABLET | Freq: Two times a day (BID) | ORAL | 0 refills | Status: AC | PRN

## 2024-08-13 NOTE — Telephone Encounter (Unsigned)
 Copied from CRM [id number]. Topic: Referral - Question >> Aug 13, 2024 10:06 AM Leah Lawson wrote: Reason for CRM: Patient has question about needing a referral for her specialists - she has physical therapy scheduled and needs to know if she needs referral since she was already going- [phone]

## 2024-08-13 NOTE — Telephone Encounter (Signed)
 Prior Authorization for oxyCODONE -Acetaminophen  5-325MG  tablets APPROVED PA# [id number] Valid: 08/11/24-09/10/24

## 2024-08-13 NOTE — Telephone Encounter (Signed)
 Resolved and since

## 2024-08-13 NOTE — Addendum Note (Signed)
 Addended by: MARDY LEOTIS RAMAN on: 08/13/2024 12:08 PM   Modules accepted: Orders

## 2024-08-13 NOTE — Telephone Encounter (Signed)
 I called and spoke to [facility]. A new prescription needs to be sent in so that they can fill this with the PA on file.  Can this be sent ASAP?

## 2024-08-16 NOTE — Telephone Encounter (Signed)
 Please see previous note, no referral needed. They will let her know if anything is needed.
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
## 2024-08-18 ENCOUNTER — Encounter (INDEPENDENT_AMBULATORY_CARE_PROVIDER_SITE_OTHER): Payer: Self-pay | Admitting: Physician Assistant

## 2024-08-18 ENCOUNTER — Ambulatory Visit

## 2024-08-18 ENCOUNTER — Ambulatory Visit (INDEPENDENT_AMBULATORY_CARE_PROVIDER_SITE_OTHER): Admitting: Physician Assistant

## 2024-08-18 VITALS — BP 111/74 | HR 98 | Temp 98.4°F | Ht 62.0 in | Wt 219.0 lb

## 2024-08-18 DIAGNOSIS — Z7985 Long-term (current) use of injectable non-insulin antidiabetic drugs: Secondary | ICD-10-CM | POA: Diagnosis not present

## 2024-08-18 DIAGNOSIS — M5459 Other low back pain: Secondary | ICD-10-CM | POA: Diagnosis not present

## 2024-08-18 DIAGNOSIS — E559 Vitamin D deficiency, unspecified: Secondary | ICD-10-CM

## 2024-08-18 DIAGNOSIS — E785 Hyperlipidemia, unspecified: Secondary | ICD-10-CM

## 2024-08-18 DIAGNOSIS — E1169 Type 2 diabetes mellitus with other specified complication: Secondary | ICD-10-CM

## 2024-08-18 DIAGNOSIS — I10 Essential (primary) hypertension: Secondary | ICD-10-CM | POA: Diagnosis not present

## 2024-08-18 DIAGNOSIS — E669 Obesity, unspecified: Secondary | ICD-10-CM

## 2024-08-18 DIAGNOSIS — E1159 Type 2 diabetes mellitus with other circulatory complications: Secondary | ICD-10-CM

## 2024-08-18 DIAGNOSIS — Z6841 Body Mass Index (BMI) 40.0 and over, adult: Secondary | ICD-10-CM

## 2024-08-18 DIAGNOSIS — Z7984 Long term (current) use of oral hypoglycemic drugs: Secondary | ICD-10-CM

## 2024-08-18 DIAGNOSIS — M519 Unspecified thoracic, thoracolumbar and lumbosacral intervertebral disc disorder: Secondary | ICD-10-CM

## 2024-08-18 DIAGNOSIS — M545 Low back pain, unspecified: Secondary | ICD-10-CM

## 2024-08-18 DIAGNOSIS — I152 Hypertension secondary to endocrine disorders: Secondary | ICD-10-CM

## 2024-08-18 DIAGNOSIS — G8929 Other chronic pain: Secondary | ICD-10-CM

## 2024-08-18 MED ORDER — TIRZEPATIDE 12.5 MG/0.5ML ~~LOC~~ SOAJ: 12.5000 mg | SUBCUTANEOUS | 0 refills | Status: AC

## 2024-08-18 MED ORDER — VITAMIN D (ERGOCALCIFEROL) 1.25 MG (50000 UNIT) PO CAPS: 50000.0000 [IU] | ORAL_CAPSULE | ORAL | 0 refills | Status: AC

## 2024-08-19 ENCOUNTER — Telehealth (INDEPENDENT_AMBULATORY_CARE_PROVIDER_SITE_OTHER): Payer: Self-pay

## 2024-08-19 NOTE — Telephone Encounter (Signed)
 Patient Name: Leah Lawson    Patient DOB: 04-28-1956 Patient ID: [id number]    Status of Request: Approve Medication Name: Mounjaro  Inj 12.5/0.5  GPI/NDC: [id number]   Decision Notes: MOUNJARO  INJ 12.5/0.5, use as directed, is approved through 08/04/2025 under your Medicare Part D benefit.

## 2024-08-22 ENCOUNTER — Other Ambulatory Visit: Payer: Self-pay | Admitting: Family Medicine

## 2024-08-22 DIAGNOSIS — M5136 Other intervertebral disc degeneration, lumbar region with discogenic back pain only: Secondary | ICD-10-CM

## 2024-08-22 DIAGNOSIS — M545 Low back pain, unspecified: Secondary | ICD-10-CM
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
## 2024-08-24 ENCOUNTER — Telehealth (HOSPITAL_BASED_OUTPATIENT_CLINIC_OR_DEPARTMENT_OTHER): Payer: Self-pay

## 2024-08-24 ENCOUNTER — Other Ambulatory Visit (HOSPITAL_BASED_OUTPATIENT_CLINIC_OR_DEPARTMENT_OTHER): Payer: Self-pay

## 2024-08-24 DIAGNOSIS — G4733 Obstructive sleep apnea (adult) (pediatric): Secondary | ICD-10-CM

## 2024-08-24 NOTE — Telephone Encounter (Signed)
 Copied from CRM [id number]. Topic: Clinical - Order For Equipment >> Aug 24, 2024 10:15 AM Dedra B wrote: Reason for CRM: Patient needs a new prescription for CPAP and oxygen  sent to [facility] or another company that's in network with her insurance. Her supplies are currently being provided by [facility], but they told her that she is no longer in network. They are coming to get her equipment as soon as she is setup with another company or will start charging her for it. Patient said she was told that she is under review by the provider but doesn't know what that means and would like a call to discuss.

## 2024-08-25 ENCOUNTER — Ambulatory Visit

## 2024-08-25 DIAGNOSIS — M5459 Other low back pain: Secondary | ICD-10-CM | POA: Diagnosis not present

## 2024-08-25 DIAGNOSIS — G8929 Other chronic pain: Secondary | ICD-10-CM

## 2024-08-26 ENCOUNTER — Telehealth (HOSPITAL_BASED_OUTPATIENT_CLINIC_OR_DEPARTMENT_OTHER): Payer: Self-pay | Admitting: Pulmonary Disease

## 2024-08-26 NOTE — Telephone Encounter (Signed)
 Per Dolanda with [facility] There are no saturations on here to qualifiy for O2  Please advise

## 2024-08-31 ENCOUNTER — Telehealth (HOSPITAL_BASED_OUTPATIENT_CLINIC_OR_DEPARTMENT_OTHER): Payer: Self-pay

## 2024-08-31 NOTE — Telephone Encounter (Signed)
 CMN received for oxygen  signed sent to [facility] by provider and faxed confirmation received

## 2024-09-01 NOTE — Telephone Encounter (Signed)
 Email of titiration was received by [facility]. NFN

## 2024-09-01 NOTE — Telephone Encounter (Signed)
 Emailed the titration results to [facility] 08/27/2024

## 2024-09-02 ENCOUNTER — Ambulatory Visit: Admitting: Physical Medicine and Rehabilitation

## 2024-09-02 ENCOUNTER — Other Ambulatory Visit: Payer: Self-pay

## 2024-09-02 VITALS — BP 125/84 | HR 96

## 2024-09-02 DIAGNOSIS — M5416 Radiculopathy, lumbar region: Secondary | ICD-10-CM | POA: Diagnosis not present

## 2024-09-02 MED ORDER — METHYLPREDNISOLONE ACETATE 40 MG/ML IJ SUSP
40.0000 mg | Freq: Once | INTRAMUSCULAR | Status: AC
  Administered 2024-09-02: 40 mg

## 2024-09-02 NOTE — Progress Notes (Signed)
 Pain Scale   Average Pain 10 Patient advising she has lower back pain radiating bilaterally to both legs and pain is constant.        +Driver, -BT, -Dye Allergies.

## 2024-09-10 ENCOUNTER — Other Ambulatory Visit: Payer: Self-pay | Admitting: Gastroenterology

## 2024-09-10 MED ORDER — OMEPRAZOLE 20 MG PO CPDR: DELAYED_RELEASE_CAPSULE | ORAL | 0 refills | Status: AC

## 2024-09-10 NOTE — Addendum Note (Signed)
 Addended by: MADAN, Harshith Pursell L on: 09/10/2024 10:57 AM   Modules accepted: Orders

## 2024-09-10 NOTE — Telephone Encounter (Signed)
"   Incoming call from pt regarding Rx refill omeprazole  (PRILOSEC). Pt stated pharmacy denied request Pt scheduled for 09/16/2024. Please advise. Thank you. "

## 2024-09-10 NOTE — Telephone Encounter (Signed)
 Prescription sent to patient's pharmacy until scheduled appt.

## 2024-09-14 ENCOUNTER — Ambulatory Visit (INDEPENDENT_AMBULATORY_CARE_PROVIDER_SITE_OTHER): Admitting: Physician Assistant

## 2024-09-16 ENCOUNTER — Ambulatory Visit: Admitting: Gastroenterology

## 2024-10-13 ENCOUNTER — Ambulatory Visit (INDEPENDENT_AMBULATORY_CARE_PROVIDER_SITE_OTHER): Admitting: Physician Assistant

## 2024-10-26 ENCOUNTER — Encounter: Admitting: Family Medicine
